# Patient Record
Sex: Male | Born: 1955 | Race: White | Hispanic: No | Marital: Single | Smoking: Current every day smoker
Health system: Southern US, Community
[De-identification: ages and names within clinical notes are randomized; demographics above are authoritative.]

## PROBLEM LIST (undated history)

## (undated) DIAGNOSIS — M1990 Unspecified osteoarthritis, unspecified site: Secondary | ICD-10-CM

## (undated) DIAGNOSIS — Z789 Other specified health status: Secondary | ICD-10-CM

## (undated) DIAGNOSIS — Z7289 Other problems related to lifestyle: Secondary | ICD-10-CM

## (undated) DIAGNOSIS — F1090 Alcohol use, unspecified, uncomplicated: Secondary | ICD-10-CM

## (undated) DIAGNOSIS — Z720 Tobacco use: Secondary | ICD-10-CM

## (undated) DIAGNOSIS — J449 Chronic obstructive pulmonary disease, unspecified: Secondary | ICD-10-CM

## (undated) DIAGNOSIS — I509 Heart failure, unspecified: Secondary | ICD-10-CM

## (undated) DIAGNOSIS — M545 Low back pain, unspecified: Secondary | ICD-10-CM

## (undated) DIAGNOSIS — I10 Essential (primary) hypertension: Secondary | ICD-10-CM

## (undated) HISTORY — PX: OTHER SURGICAL HISTORY: SHX169

---

## 2003-09-30 ENCOUNTER — Encounter: Payer: Self-pay | Admitting: Family Medicine

## 2003-09-30 ENCOUNTER — Emergency Department (HOSPITAL_COMMUNITY): Admission: AD | Admit: 2003-09-30 | Discharge: 2003-09-30 | Payer: Self-pay | Admitting: Family Medicine

## 2003-10-07 ENCOUNTER — Emergency Department (HOSPITAL_COMMUNITY): Admission: EM | Admit: 2003-10-07 | Discharge: 2003-10-07 | Payer: Self-pay | Admitting: Emergency Medicine

## 2003-10-08 ENCOUNTER — Emergency Department (HOSPITAL_COMMUNITY): Admission: AD | Admit: 2003-10-08 | Discharge: 2003-10-08 | Payer: Self-pay | Admitting: Family Medicine

## 2003-10-14 ENCOUNTER — Emergency Department (HOSPITAL_COMMUNITY): Admission: AD | Admit: 2003-10-14 | Discharge: 2003-10-14 | Payer: Self-pay | Admitting: Family Medicine

## 2004-04-30 ENCOUNTER — Other Ambulatory Visit: Payer: Self-pay

## 2004-09-24 ENCOUNTER — Emergency Department: Payer: Self-pay | Admitting: Unknown Physician Specialty

## 2004-09-24 ENCOUNTER — Other Ambulatory Visit: Payer: Self-pay

## 2004-10-03 ENCOUNTER — Emergency Department: Payer: Self-pay | Admitting: Emergency Medicine

## 2005-02-14 ENCOUNTER — Emergency Department: Payer: Self-pay | Admitting: Emergency Medicine

## 2005-04-09 ENCOUNTER — Emergency Department: Payer: Self-pay | Admitting: Internal Medicine

## 2008-12-10 ENCOUNTER — Emergency Department: Payer: Self-pay | Admitting: Emergency Medicine

## 2011-06-01 ENCOUNTER — Emergency Department: Payer: Self-pay | Admitting: Emergency Medicine

## 2011-10-16 ENCOUNTER — Emergency Department: Payer: Self-pay | Admitting: Emergency Medicine

## 2011-12-24 ENCOUNTER — Emergency Department: Payer: Self-pay | Admitting: *Deleted

## 2012-09-30 ENCOUNTER — Emergency Department: Payer: Self-pay | Admitting: Emergency Medicine

## 2012-09-30 LAB — COMPREHENSIVE METABOLIC PANEL
Albumin: 4 g/dL (ref 3.4–5.0)
Alkaline Phosphatase: 77 U/L (ref 50–136)
Anion Gap: 11 (ref 7–16)
BUN: 10 mg/dL (ref 7–18)
Bilirubin,Total: 0.3 mg/dL (ref 0.2–1.0)
Calcium, Total: 9.3 mg/dL (ref 8.5–10.1)
Chloride: 103 mmol/L (ref 98–107)
Co2: 25 mmol/L (ref 21–32)
Creatinine: 0.9 mg/dL (ref 0.60–1.30)
EGFR (African American): >60
EGFR (Non-African Amer.): >60
Glucose: 94 mg/dL (ref 65–99)
Osmolality: 276 (ref 275–301)
Potassium: 3.5 mmol/L (ref 3.5–5.1)
SGOT(AST): 29 U/L (ref 15–37)
SGPT (ALT): 28 U/L (ref 12–78)
Sodium: 139 mmol/L (ref 136–145)
Total Protein: 7.6 g/dL (ref 6.4–8.2)

## 2012-09-30 LAB — CBC
HCT: 49.9 % (ref 40.0–52.0)
HGB: 17.2 g/dL (ref 13.0–18.0)
MCH: 33.3 pg (ref 26.0–34.0)
MCHC: 34.5 g/dL (ref 32.0–36.0)
MCV: 96 fL (ref 80–100)
Platelet: 233 10*3/uL (ref 150–440)
RBC: 5.18 10*6/uL (ref 4.40–5.90)
RDW: 13.7 % (ref 11.5–14.5)
WBC: 9.9 10*3/uL (ref 3.8–10.6)

## 2012-11-09 ENCOUNTER — Emergency Department: Payer: Self-pay | Admitting: Unknown Physician Specialty

## 2012-11-09 LAB — COMPREHENSIVE METABOLIC PANEL
Albumin: 3.3 g/dL — ABNORMAL LOW (ref 3.4–5.0)
Alkaline Phosphatase: 80 U/L (ref 50–136)
Anion Gap: 5 — ABNORMAL LOW (ref 7–16)
BUN: 6 mg/dL — ABNORMAL LOW (ref 7–18)
Bilirubin,Total: 0.3 mg/dL (ref 0.2–1.0)
Calcium, Total: 8.9 mg/dL (ref 8.5–10.1)
Chloride: 99 mmol/L (ref 98–107)
Co2: 30 mmol/L (ref 21–32)
Creatinine: 0.72 mg/dL (ref 0.60–1.30)
EGFR (African American): >60
EGFR (Non-African Amer.): >60
Glucose: 86 mg/dL (ref 65–99)
Osmolality: 265 (ref 275–301)
Potassium: 4 mmol/L (ref 3.5–5.1)
SGOT(AST): 31 U/L (ref 15–37)
SGPT (ALT): 27 U/L (ref 12–78)
Sodium: 134 mmol/L — ABNORMAL LOW (ref 136–145)
Total Protein: 7.2 g/dL (ref 6.4–8.2)

## 2012-11-09 LAB — URINALYSIS, COMPLETE
Bacteria: NONE SEEN
Bilirubin,UR: NEGATIVE
Blood: NEGATIVE
Glucose,UR: NEGATIVE mg/dL (ref 0–75)
Hyaline Cast: 6
Ketone: NEGATIVE
Leukocyte Esterase: NEGATIVE
Nitrite: NEGATIVE
Ph: 6 (ref 4.5–8.0)
Protein: NEGATIVE
RBC,UR: <1 /HPF (ref 0–5)
Specific Gravity: 1.005 (ref 1.003–1.030)
Squamous Epithelial: NONE SEEN
WBC UR: <1 /HPF (ref 0–5)

## 2012-11-09 LAB — CK TOTAL AND CKMB (NOT AT ARMC)
CK, Total: 22 U/L — ABNORMAL LOW (ref 35–232)
CK-MB: <0.5 ng/mL — ABNORMAL LOW (ref 0.5–3.6)

## 2012-11-09 LAB — CBC
HCT: 45.3 % (ref 40.0–52.0)
HGB: 15.3 g/dL (ref 13.0–18.0)
MCH: 31.5 pg (ref 26.0–34.0)
MCHC: 33.7 g/dL (ref 32.0–36.0)
MCV: 93 fL (ref 80–100)
Platelet: 355 10*3/uL (ref 150–440)
RBC: 4.85 10*6/uL (ref 4.40–5.90)
RDW: 13.7 % (ref 11.5–14.5)
WBC: 13.5 10*3/uL — ABNORMAL HIGH (ref 3.8–10.6)

## 2012-11-09 LAB — TROPONIN I: Troponin-I: <0.02 ng/mL

## 2012-11-15 LAB — CULTURE, BLOOD (SINGLE)

## 2013-12-09 ENCOUNTER — Observation Stay: Payer: Self-pay | Admitting: Student

## 2013-12-09 LAB — CBC WITH DIFFERENTIAL/PLATELET
Basophil #: 0.1 10*3/uL (ref 0.0–0.1)
Basophil %: 1 %
Eosinophil #: 0.3 10*3/uL (ref 0.0–0.7)
Eosinophil %: 2.7 %
HCT: 49.4 % (ref 40.0–52.0)
HGB: 17.1 g/dL (ref 13.0–18.0)
Lymphocyte #: 3.5 10*3/uL (ref 1.0–3.6)
Lymphocyte %: 32.7 %
MCH: 33 pg (ref 26.0–34.0)
MCHC: 34.6 g/dL (ref 32.0–36.0)
MCV: 95 fL (ref 80–100)
Monocyte #: 0.8 x10 3/mm (ref 0.2–1.0)
Monocyte %: 7.2 %
Neutrophil #: 6 10*3/uL (ref 1.4–6.5)
Neutrophil %: 56.4 %
Platelet: 316 10*3/uL (ref 150–440)
RBC: 5.18 10*6/uL (ref 4.40–5.90)
RDW: 13.1 % (ref 11.5–14.5)
WBC: 10.6 10*3/uL (ref 3.8–10.6)

## 2013-12-09 LAB — BASIC METABOLIC PANEL
Anion Gap: 7 (ref 7–16)
BUN: 6 mg/dL — ABNORMAL LOW (ref 7–18)
Calcium, Total: 9.2 mg/dL (ref 8.5–10.1)
Chloride: 101 mmol/L (ref 98–107)
Co2: 31 mmol/L (ref 21–32)
Creatinine: 0.77 mg/dL (ref 0.60–1.30)
EGFR (African American): >60
EGFR (Non-African Amer.): >60
Glucose: 93 mg/dL (ref 65–99)
Osmolality: 275 (ref 275–301)
Potassium: 3.7 mmol/L (ref 3.5–5.1)
Sodium: 139 mmol/L (ref 136–145)

## 2013-12-10 LAB — BASIC METABOLIC PANEL
Anion Gap: 2 — ABNORMAL LOW (ref 7–16)
BUN: 8 mg/dL (ref 7–18)
Calcium, Total: 9.4 mg/dL (ref 8.5–10.1)
Chloride: 98 mmol/L (ref 98–107)
Co2: 34 mmol/L — ABNORMAL HIGH (ref 21–32)
Creatinine: 0.83 mg/dL (ref 0.60–1.30)
EGFR (African American): >60
EGFR (Non-African Amer.): >60
Glucose: 165 mg/dL — ABNORMAL HIGH (ref 65–99)
Osmolality: 270 (ref 275–301)
Potassium: 4.1 mmol/L (ref 3.5–5.1)
Sodium: 134 mmol/L — ABNORMAL LOW (ref 136–145)

## 2014-02-04 ENCOUNTER — Emergency Department: Payer: Self-pay | Admitting: Emergency Medicine

## 2015-04-08 NOTE — H&P (Signed)
PATIENT NAME:  Kurt Blankenship, Kurt Blankenship MR#:  [id number] DATE OF BIRTH:  [birth year]  DATE OF ADMISSION:  12/09/2013  PRIMARY CARE PHYSICIAN: Dr. Lacey Jensen.  CHIEF COMPLAINT: Pain in the ribs.   This is a pleasant 59 year old male with smoking dependence, hypertension, who presents with the above complaint. The patient said that he was walking and he tripped over his chair. His legs just kind of gave out and he landed on his left side, and he had pain. He took his oxycodone 10 mg at home, but it did not help. He came here to the ER for further evaluation. In the ER, a CT of the chest was performed, which was negative for rib fractures. However, he continued to have ongoing pain and was unable to move without any pain, so hospitalist was consulted for admission.   REVIEW OF SYSTEMS:  CONSTITUTIONAL: No fever, fatigue, weakness.  Positive pain. No weight loss or weight gain. EYES:  No blurred or double vision.   ENT: No ear pain, hearing loss, seasonal allergies, postnasal drip or sinus pain.  RESPIRATORY:  No cough. Positive wheezing. Positive history of COPD. No hemoptysis, dyspnea. Positive painful respirations.  CARDIOVASCULAR: No chest pain. No orthopnea, edema, arrhythmia, dyspnea on exertion, palpitations or syncope.   GASTROINTESTINAL: No nausea, vomiting, diarrhea, abdominal pain, melena or ulcers.  GENITOURINARY: No dysuria or hematuria.    ENDOCRINE: No polyuria or polydipsia.  HEMATOLOGIC AND LYMPHATICS:  No anemia or easy bruising.  SKIN: No rash or lesions.  MUSCULOSKELETAL: Positive pain in the ribs.  NEUROLOGIC:  No history of CVA, TIA or seizure.  PSYCHIATRIC: No history of anxiety or depression.   PAST MEDICAL HISTORY:   1.  COPD, not on oxygen.  2.  Hypertension.   PAST SURGICAL HISTORY: 1.  Left knee surgery.  2.  Appendectomy.   ALLERGIES: No known drug allergies.   MEDICATIONS:  1.  Ventolin HFA 2 puffs 4 times a day.  2.  Oxycodone 10 mg q.4 hours.  3.  Lisinopril 10 mg  daily.   FAMILY HISTORY:  His mom and dad are deceased.  SOCIAL HISTORY: The patient smokes half pack a day. Not interested in quitting smoking. Occasional alcohol.   PHYSICAL EXAMINATION:  VITAL SIGNS:  Temperature 97.7, pulse 75, respirations 18, blood pressure 169/102, 97% on room air.  GENERAL: The patient is alert, oriented, in mild distress due to his pain.  HEENT: Head is atraumatic. Pupils are round. Sclerae anicteric. Mucous membranes are moist. Oropharynx is clear.  NECK: Supple without JVD, carotid bruit or enlarged thyroid.  CARDIOVASCULAR: Regular rate and rhythm. No murmurs, gallops or rubs. PMI is not displaced.  LUNGS:  He has got some mild expiratory wheezing with some mild rhonchi, especially on the right lung. No rales or crackles are heard. Good air movement. No dullness to percussion or egophony.  ABDOMEN: Bowel sounds are present. Nontender, nondistended. No hepatosplenomegaly.  BACK: No CVA or vertebral tenderness. He is tender at the lower left ribs.  EXTREMITIES:  No clubbing, cyanosis or edema.  NEUROLOGIC:  Cranial nerves II through XII are intact. There are no focal deficits.  SKIN: Without rash or lesions.  MUSCULOSKELETAL:  The patient is able to move all extremities. No pathology to digits or nails.   LABORATORY DATA: Sodium 139, potassium 3.7, chloride 101, bicarb 31, BUN 6, creatinine 0.77. Glucose is 93. White blood cells 10.6, hemoglobin 17, hematocrit 49.4. Platelets are 316. PH 7.31, pCO2 of 69. CT chest shows no  acute rib fractures.   Chest x-ray shows no acute cardiopulmonary disease.   EKG: Normal sinus rhythm. No ST elevation or depression.   ASSESSMENT AND PLAN:  A 59 year old male who had a mechanical fall, who suffered contusion to his left chest wall without any rib fractures on CT scan.  1.  Chest contusion. The patient has ongoing intractable pain. The patient will need observation for his pain, and he is unable to move. I will continue his  oxycodone, write for a lidocaine patch, as well as p.r.n. medications. He may need to be, for a short time, on long-acting oxycodone with breakthrough Percocet.  I have asked for incentive spirometer.  2.  Chronic obstructive pulmonary disease. The patient has some mild wheezing on exam. He says he always has wheezing. I do not think this is an acute COPD exacerbation. We will continue his inhalers and monitor.  3.  Tobacco dependence. The patient does not want to quit smoking. I have placed a nicotine patch. The patient was counseled for 3 minutes.  4.  Accelerated hypertension in part due to his pain. We will continue lisinopril, and I have written for hydralazine p.r.n.   The patient is a full code status.      TIME SPENT: Approximately 40 minutes.    ____________________________ Janyth Contes. Juliene Pina, MD spm:dmm D: 12/09/2013 22:03:50 ET T: 12/09/2013 22:42:57 ET JOB#: [id number]  cc: Dorene Bruni P. Juliene Pina, MD, <Dictator> Lacey Jensen, FNP-C Mirel Hundal P Teal Raben MD ELECTRONICALLY SIGNED 12/10/2013 2:11

## 2015-04-09 NOTE — Discharge Summary (Signed)
PATIENT NAME:  Kurt Blankenship, Kurt Blankenship MR#:  [id number] DATE OF BIRTH:  1956/09/09  DATE OF ADMISSION:  12/09/2013 DATE OF DISCHARGE:  12/10/2013  PRIMARY CARE PHYSICIAN: Lacey Jensen, MD  CHIEF COMPLAINT: Status post fall and rib pain.   DISCHARGE DIAGNOSES:  1.  Rib pain, status post fall, without contusion or fracture.  2.  Chronic obstructive pulmonary disease.  3.  Hypertension.   DISCHARGE MEDICATIONS: Ventolin HFA 2 puffs four times a day as needed, oxycodone 10 mg every 4 hours, lisinopril 10 mg daily, Percocet 5/325, one tab every 4 hours as needed for pain for 4 days.   DIET: Low sodium.   ACTIVITY: As tolerated.   FOLLOWUP: Please follow with PCP within 1 to 2 weeks.   DISPOSITION: Home.   SIGNIFICANT LABORATORIES AND IMAGING: X-ray of the chest, 1 view: Right basilar scarring.   CT chest without contrast showed no acute displaced rib fracture or focal chest wall abnormality. No acute pulmonary contusion, hemorrhage, or pneumothorax. There is COPD and emphysema, probable right upper lobe parenchymal scarring, medial right middle lobe chronic atelectasis versus scarring.   CBC within normal limits. BUN 6, creatinine 0.77 on admission, sodium 139, potassium 3.7.   HISTORY OF PRESENT ILLNESS AND HOSPITAL COURSE: For full details of H and P, please see the dictation on December 24 by Dr. Juliene Pina, but briefly this is a 59 year old with  COPD, hypertension, and chronic pain, who tripped, sustaining a fall and developed left-sided pain where he fell on the chair. He came into the hospital after taking his oxycodone without any significant improvement. Here, a CT of the chest was performed which was negative for fracture;  however, he had ongoing pains and therefore he was admitted to the hospitalist service for observation. He was started on some Percocet p.r.n., morphine IV p.r.n. and incentive spirometer and oxygen. He did well. Currently, he has controlled pain. Use of incentive spirometer was  strongly encouraged to him and, at this point, as the pain is better, he is moving a good amount of air, ambulating, tolerating diet.   PHYSICAL EXAMINATION:  VITAL SIGNS: Today's temperature is 97.4. Pulse rate 74, respiratory rate 17, blood pressure 167/80, oxygen saturation 94% on oxygen.  GENERAL: Appears older than age.  HEENT: Normocephalic, atraumatic. Poor dentition. Moist mucous membranes.  NECK: Supple.  CARDIOVASCULAR: S1, S2, irregularly irregular.  LUNGS: Clear to auscultation without wheezing, rhonchi or rales. Moving good air entry.  ABDOMEN: Soft, nontender, nondistended. Some mild tenderness to palpation on the left medial rib cage but no ecchymosis. More generalized tenderness without any focal tenderness.  EXTREMITIES: No pitting edema.  ABDOMEN: Soft, nontender.   At this point, he will be discharged with outpatient follow-up.   TOTAL TIME SPENT: 30 minutes.   ____________________________ Krystal Eaton, MD sa:np D: 12/10/2013 13:04:50 ET T: 12/10/2013 15:41:46 ET JOB#: [id number]  cc: Krystal Eaton, MD, <Dictator> Lacey Jensen, MD Krystal Eaton MD ELECTRONICALLY SIGNED 12/29/2013 11:18

## 2016-05-15 ENCOUNTER — Encounter: Payer: Self-pay | Admitting: Emergency Medicine

## 2016-05-15 ENCOUNTER — Inpatient Hospital Stay
Admission: EM | Admit: 2016-05-15 | Discharge: 2016-05-16 | DRG: 190 | Disposition: A | Discharge status: Home / Self Care | Payer: Medicare Other | Attending: Internal Medicine | Admitting: Internal Medicine

## 2016-05-15 ENCOUNTER — Emergency Department: Payer: Medicare Other

## 2016-05-15 DIAGNOSIS — Z79891 Long term (current) use of opiate analgesic: Secondary | ICD-10-CM | POA: Diagnosis not present

## 2016-05-15 DIAGNOSIS — M549 Dorsalgia, unspecified: Secondary | ICD-10-CM | POA: Diagnosis present

## 2016-05-15 DIAGNOSIS — Z7952 Long term (current) use of systemic steroids: Secondary | ICD-10-CM | POA: Diagnosis not present

## 2016-05-15 DIAGNOSIS — I10 Essential (primary) hypertension: Secondary | ICD-10-CM | POA: Diagnosis present

## 2016-05-15 DIAGNOSIS — J449 Chronic obstructive pulmonary disease, unspecified: Secondary | ICD-10-CM | POA: Diagnosis present

## 2016-05-15 DIAGNOSIS — J9601 Acute respiratory failure with hypoxia: Secondary | ICD-10-CM | POA: Diagnosis present

## 2016-05-15 DIAGNOSIS — F17210 Nicotine dependence, cigarettes, uncomplicated: Secondary | ICD-10-CM | POA: Diagnosis present

## 2016-05-15 DIAGNOSIS — G8929 Other chronic pain: Secondary | ICD-10-CM | POA: Diagnosis present

## 2016-05-15 DIAGNOSIS — Z79899 Other long term (current) drug therapy: Secondary | ICD-10-CM | POA: Diagnosis not present

## 2016-05-15 DIAGNOSIS — J441 Chronic obstructive pulmonary disease with (acute) exacerbation: Principal | ICD-10-CM | POA: Diagnosis present

## 2016-05-15 HISTORY — DX: Chronic obstructive pulmonary disease, unspecified: J44.9

## 2016-05-15 HISTORY — DX: Essential (primary) hypertension: I10

## 2016-05-15 LAB — COMPREHENSIVE METABOLIC PANEL
ALT: 21 U/L (ref 17–63)
AST: 29 U/L (ref 15–41)
Albumin: 4.2 g/dL (ref 3.5–5.0)
Alkaline Phosphatase: 63 U/L (ref 38–126)
Anion gap: 7 (ref 5–15)
BUN: 9 mg/dL (ref 6–20)
CO2: 31 mmol/L (ref 22–32)
Calcium: 9.2 mg/dL (ref 8.9–10.3)
Chloride: 97 mmol/L — ABNORMAL LOW (ref 101–111)
Creatinine, Ser: 0.72 mg/dL (ref 0.61–1.24)
GFR calc Af Amer: >60 mL/min (ref >60)
GFR calc non Af Amer: >60 mL/min (ref >60)
Glucose, Bld: 93 mg/dL (ref 65–99)
Potassium: 3.7 mmol/L (ref 3.5–5.1)
Sodium: 135 mmol/L (ref 135–145)
Total Bilirubin: 0.5 mg/dL (ref 0.3–1.2)
Total Protein: 7.1 g/dL (ref 6.5–8.1)

## 2016-05-15 LAB — CBC WITH DIFFERENTIAL/PLATELET
Basophils Absolute: 0.1 10*3/uL (ref 0–0.1)
Basophils Relative: 1 %
Eosinophils Absolute: 0.6 10*3/uL (ref 0–0.7)
Eosinophils Relative: 9 %
HCT: 48.3 % (ref 40.0–52.0)
Hemoglobin: 16.9 g/dL (ref 13.0–18.0)
Lymphocytes Relative: 27 %
Lymphs Abs: 1.8 10*3/uL (ref 1.0–3.6)
MCH: 33.8 pg (ref 26.0–34.0)
MCHC: 35.1 g/dL (ref 32.0–36.0)
MCV: 96.3 fL (ref 80.0–100.0)
Monocytes Absolute: 0.7 10*3/uL (ref 0.2–1.0)
Monocytes Relative: 11 %
Neutro Abs: 3.6 10*3/uL (ref 1.4–6.5)
Neutrophils Relative %: 52 %
Platelets: 267 10*3/uL (ref 150–440)
RBC: 5.01 MIL/uL (ref 4.40–5.90)
RDW: 15 % — ABNORMAL HIGH (ref 11.5–14.5)
WBC: 6.9 10*3/uL (ref 3.8–10.6)

## 2016-05-15 LAB — TROPONIN I: Troponin I: <0.03 ng/mL (ref <0.031)

## 2016-05-15 MED ORDER — ALBUTEROL SULFATE HFA 108 (90 BASE) MCG/ACT IN AERS: 2.0000 | INHALATION_SPRAY | RESPIRATORY_TRACT | Status: DC | PRN

## 2016-05-15 MED ORDER — ALBUTEROL SULFATE HFA 108 (90 BASE) MCG/ACT IN AERS: 2.0000 | INHALATION_SPRAY | Freq: Four times a day (QID) | RESPIRATORY_TRACT | Status: DC | PRN

## 2016-05-15 MED ORDER — ALBUTEROL SULFATE (2.5 MG/3ML) 0.083% IN NEBU
2.5000 mg | INHALATION_SOLUTION | Freq: Once | RESPIRATORY_TRACT | Status: AC
  Administered 2016-05-15: 2.5 mg via RESPIRATORY_TRACT
  Filled 2016-05-15: qty 3

## 2016-05-15 MED ORDER — METHYLPREDNISOLONE SODIUM SUCC 125 MG IJ SOLR
125.0000 mg | Freq: Once | INTRAMUSCULAR | Status: AC
  Administered 2016-05-15: 125 mg via INTRAVENOUS
  Filled 2016-05-15: qty 2

## 2016-05-15 MED ORDER — IPRATROPIUM-ALBUTEROL 0.5-2.5 (3) MG/3ML IN SOLN
3.0000 mL | Freq: Once | RESPIRATORY_TRACT | Status: AC
  Administered 2016-05-15: 3 mL via RESPIRATORY_TRACT
  Filled 2016-05-15: qty 3

## 2016-05-15 MED ORDER — ENOXAPARIN SODIUM 40 MG/0.4ML ~~LOC~~ SOLN
40.0000 mg | SUBCUTANEOUS | Status: DC
  Administered 2016-05-15: 40 mg via SUBCUTANEOUS
  Filled 2016-05-15: qty 0.4

## 2016-05-15 MED ORDER — AZITHROMYCIN 250 MG PO TABS: ORAL_TABLET | ORAL | Status: DC

## 2016-05-15 MED ORDER — ALBUTEROL SULFATE (2.5 MG/3ML) 0.083% IN NEBU
2.5000 mg | INHALATION_SOLUTION | Freq: Four times a day (QID) | RESPIRATORY_TRACT | Status: DC
Start: 2016-05-15 — End: 2016-05-16
  Administered 2016-05-15 – 2016-05-16 (×3): 2.5 mg via RESPIRATORY_TRACT
  Filled 2016-05-15 (×4): qty 3

## 2016-05-15 MED ORDER — ALBUTEROL SULFATE (2.5 MG/3ML) 0.083% IN NEBU: 5.0000 mg | INHALATION_SOLUTION | Freq: Once | RESPIRATORY_TRACT | Status: DC

## 2016-05-15 MED ORDER — MOMETASONE FURO-FORMOTEROL FUM 200-5 MCG/ACT IN AERO
2.0000 | INHALATION_SPRAY | Freq: Two times a day (BID) | RESPIRATORY_TRACT | Status: DC
  Administered 2016-05-15 – 2016-05-16 (×3): 2 via RESPIRATORY_TRACT
  Filled 2016-05-15: qty 8.8

## 2016-05-15 MED ORDER — MAGNESIUM SULFATE 2 GM/50ML IV SOLN
2.0000 g | Freq: Once | INTRAVENOUS | Status: AC
  Administered 2016-05-15: 2 g via INTRAVENOUS
  Filled 2016-05-15: qty 50

## 2016-05-15 MED ORDER — CHLORTHALIDONE 25 MG PO TABS
25.0000 mg | ORAL_TABLET | Freq: Every day | ORAL | Status: DC
  Administered 2016-05-15 – 2016-05-16 (×2): 25 mg via ORAL
  Filled 2016-05-15 (×2): qty 1

## 2016-05-15 MED ORDER — PREDNISONE 20 MG PO TABS: 60.0000 mg | ORAL_TABLET | Freq: Every day | ORAL | Status: DC

## 2016-05-15 MED ORDER — METHYLPREDNISOLONE SODIUM SUCC 125 MG IJ SOLR
60.0000 mg | Freq: Two times a day (BID) | INTRAMUSCULAR | Status: DC
  Administered 2016-05-15 – 2016-05-16 (×2): 60 mg via INTRAVENOUS
  Filled 2016-05-15 (×2): qty 2

## 2016-05-15 MED ORDER — DOXYCYCLINE HYCLATE 100 MG PO TABS
100.0000 mg | ORAL_TABLET | Freq: Two times a day (BID) | ORAL | Status: DC
  Administered 2016-05-15 – 2016-05-16 (×3): 100 mg via ORAL
  Filled 2016-05-15 (×3): qty 1

## 2016-05-15 MED ORDER — ALBUTEROL SULFATE (2.5 MG/3ML) 0.083% IN NEBU: 2.5000 mg | INHALATION_SOLUTION | RESPIRATORY_TRACT | Status: DC | PRN

## 2016-05-15 MED ORDER — ONDANSETRON HCL 4 MG/2ML IJ SOLN: 4.0000 mg | Freq: Four times a day (QID) | INTRAMUSCULAR | Status: DC | PRN

## 2016-05-15 MED ORDER — OXYCODONE HCL 5 MG PO TABS
10.0000 mg | ORAL_TABLET | Freq: Four times a day (QID) | ORAL | Status: DC
  Administered 2016-05-15 – 2016-05-16 (×5): 10 mg via ORAL
  Filled 2016-05-15 (×3): qty 2
  Filled 2016-05-15: qty 1
  Filled 2016-05-15 (×2): qty 2

## 2016-05-15 MED ORDER — ACETAMINOPHEN 650 MG RE SUPP: 650.0000 mg | Freq: Four times a day (QID) | RECTAL | Status: DC | PRN

## 2016-05-15 MED ORDER — ACETAMINOPHEN 325 MG PO TABS: 650.0000 mg | ORAL_TABLET | Freq: Four times a day (QID) | ORAL | Status: DC | PRN

## 2016-05-15 MED ORDER — ONDANSETRON HCL 4 MG PO TABS: 4.0000 mg | ORAL_TABLET | Freq: Four times a day (QID) | ORAL | Status: DC | PRN

## 2016-05-15 NOTE — ED Notes (Signed)
Patient transported to X-ray 

## 2016-05-15 NOTE — ED Provider Notes (Signed)
Despite bronchodilators, steroids, IV magnesium infusion, patient has persistent room air hypoxia to 88%. With ambulation he gets very short of breath and oxygen saturation decreases to 84%. Case discussed with hospitalist for admission.  Sharman Cheek, MD 05/15/16 1000

## 2016-05-15 NOTE — Progress Notes (Signed)
   05/15/16 1230  Clinical Encounter Type  Visited With Patient  Visit Type Initial  Referral From Nurse  Consult/Referral To Chaplain  Spiritual Encounters  Spiritual Needs Literature;Prayer  Stress Factors  Patient Stress Factors Health changes  Advance Directives (For Healthcare)  Does patient have an advance directive? No  Would patient like information on creating an advanced directive? Yes - Transport planner given  Visited patient and provided AD education. Patient was not interested in pursuing an AD, but requested prayer for healing. Prayed with and provided pastoral care for [facility]. Chap. Teriyah Purington G. Annamary Buschman, ext. [phone]

## 2016-05-15 NOTE — ED Provider Notes (Signed)
[facility] Emergency Department Provider Note   ____________________________________________  Time seen: Approximately 5:18 AM  I have reviewed the triage vital signs and the nursing notes.   HISTORY  Chief Complaint Shortness of Breath    HPI Kurt Blankenship is a 60 y.o. male who comes into the hospital today with coughing and shortness of breath. The patient reports that he was coughing so bad that he woke up and he couldn't catch his breath. The patient called EMS because he wasn't sure if he had pneumonia. He reports that he had a fever last night but he did not take his temperature. He's had a dry cough that is been nonproductive with no chest pain. He does not wear oxygen at home. The patient reports that he ran out of his inhaler earlier this afternoon. He reports that currently his breathing is improved. The patient reports that he has a headache which she is been self treating but he also has a some abdominal soreness from the coughing. The patient denies any radiation of this discomfort and any dizziness at this time.   Past Medical History  Diagnosis Date  . COPD (chronic obstructive pulmonary disease) (HCC)   . Hypertension     There are no active problems to display for this patient.   History reviewed. No pertinent past surgical history.  Current Outpatient Rx  Name  Route  Sig  Dispense  Refill  . albuterol (PROVENTIL HFA;VENTOLIN HFA) 108 (90 Base) MCG/ACT inhaler   Inhalation   Inhale 2 puffs into the lungs every 6 (six) hours as needed.   1 Inhaler   0   . azithromycin (ZITHROMAX Z-PAK) 250 MG tablet      Take 2 tablets (500 mg) on  Day 1,  followed by 1 tablet (250 mg) once daily on Days 2 through 5.   6 each   0   . predniSONE (DELTASONE) 20 MG tablet   Oral   Take 3 tablets (60 mg total) by mouth daily.   12 tablet   0     Allergies Review of patient's allergies indicates no known allergies.  History reviewed. No  pertinent family history.  Social History Social History  Substance Use Topics  . Smoking status: Current Every Day Smoker -- 0.75 packs/day    Types: Cigarettes  . Smokeless tobacco: Never Used  . Alcohol Use: Yes     Comment: beers    Review of Systems Constitutional:  fever/chills Eyes: No visual changes. ENT: No sore throat. Cardiovascular: Denies chest pain. Respiratory:  shortness of breath. Gastrointestinal: No abdominal pain.  No nausea, no vomiting.  No diarrhea.  No constipation. Genitourinary: Negative for dysuria. Musculoskeletal: Negative for back pain. Skin: Negative for rash. Neurological: Negative for headaches, focal weakness or numbness.  10-point ROS otherwise negative.  ____________________________________________   PHYSICAL EXAM:  VITAL SIGNS: ED Triage Vitals  Enc Vitals Group     BP 05/15/16 0459 143/95 mmHg     Pulse Rate 05/15/16 0459 93     Resp 05/15/16 0459 16     Temp 05/15/16 0459 98.1 F (36.7 C)     Temp Source 05/15/16 0459 Oral     SpO2 05/15/16 0459 94 %     Weight 05/15/16 0454 168 lb (76.204 kg)     Height 05/15/16 0454  (1.753 m)     Head Cir --      Peak Flow --      Pain Score  05/15/16 0451 8     Pain Loc --      Pain Edu? --      Excl. in GC? --     Constitutional: Alert and oriented. Well appearing and in moderate respiratory distress. Eyes: Conjunctivae are normal. PERRL. EOMI. Head: Atraumatic. Nose: No congestion/rhinnorhea. Mouth/Throat: Mucous membranes are moist.  Oropharynx non-erythematous. Cardiovascular: Normal rate, regular rhythm. Grossly normal heart sounds.  Good peripheral circulation. Respiratory: Normal respiratory effort.  No retractions. Expiratory wheezes throughout all lung fields. Gastrointestinal: Soft and nontender. No distention. Positive bowel sounds Musculoskeletal: No lower extremity tenderness nor edema.  No joint effusions. Neurologic:  Normal speech and language.  Skin:  Skin is  warm, dry and intact. No rash noted. Psychiatric: Mood and affect are normal. Speech and behavior are normal.  ____________________________________________   LABS (all labs ordered are listed, but only abnormal results are displayed)  Labs Reviewed  CBC WITH DIFFERENTIAL/PLATELET - Abnormal; Notable for the following:    RDW 15.0 (*)    All other components within normal limits  COMPREHENSIVE METABOLIC PANEL - Abnormal; Notable for the following:    Chloride 97 (*)    All other components within normal limits  TROPONIN I   ____________________________________________  EKG  ED ECG REPORT I, Rebecka Apley, the attending physician, personally viewed and interpreted this ECG.   Date: 05/15/2016  EKG Time: 456  Rate: 94  Rhythm: normal sinus rhythm  Axis: normal  Intervals:none  ST&T Change: none  ____________________________________________  RADIOLOGY  CXR: Mild hyperinflation and emphysema, no evidence of superimposed acute process. ____________________________________________   PROCEDURES  Procedure(s) performed: None  Critical Care performed: No  ____________________________________________   INITIAL IMPRESSION / ASSESSMENT AND PLAN / ED COURSE  Pertinent labs & imaging results that were available during my care of the patient were reviewed by me and considered in my medical decision making (see chart for details).  This is a 60 year old male who comes into the hospital today with some shortness of breath. The patient was wheezing significantly when he arrived. The patient will receive some DuoNeb treatments as well as Solu-Medrol and magnesium sulfate.  The patient's wheezing is significantly improved. He is on O2. I will have the nurse wean his O2 to determine if the patient is able to tolerate room air and then he will be dispositioned. The patient's care was signed out to Dr. Scotty Court who will reassess the patient and determine his  disposition. ____________________________________________   FINAL CLINICAL IMPRESSION(S) / ED DIAGNOSES  Final diagnoses:  COPD exacerbation (HCC)      NEW MEDICATIONS STARTED DURING THIS VISIT:  New Prescriptions   ALBUTEROL (PROVENTIL HFA;VENTOLIN HFA) 108 (90 BASE) MCG/ACT INHALER    Inhale 2 puffs into the lungs every 6 (six) hours as needed.   AZITHROMYCIN (ZITHROMAX Z-PAK) 250 MG TABLET    Take 2 tablets (500 mg) on  Day 1,  followed by 1 tablet (250 mg) once daily on Days 2 through 5.   PREDNISONE (DELTASONE) 20 MG TABLET    Take 3 tablets (60 mg total) by mouth daily.     Note:  This document was prepared using Dragon voice recognition software and may include unintentional dictation errors.    Rebecka Apley, MD 05/15/16 [phone]

## 2016-05-15 NOTE — ED Notes (Addendum)
Pt arrived to ED by [facility] with c/o SOB, cough, wheeze, and chest/abdominal pain(from coughing) x2 weeks, worse tonight that he could not sleep. Per EMS initial SpO2-80s on room air. Pt states that he ran out inhaler x2 days. Pt alerts and oriented x4, airway intact.

## 2016-05-15 NOTE — H&P (Signed)
[facility] - [place] [facility]   PATIENT NAME: Kurt Blankenship    MR#:  [id number]  DATE OF BIRTH:  10-05-1956  DATE OF ADMISSION:  05/15/2016  PRIMARY CARE PHYSICIAN: Ro, Lindalou Hose, MD   REQUESTING/REFERRING PHYSICIAN: Dr Scotty Court  CHIEF COMPLAINT:  Shortness of breath with cough and wheezing  HISTORY OF PRESENT ILLNESS:  Kurt Blankenship  is a 60 y.o. male with a known history of Emphysema with ongoing tobacco abuse not on any home oxygen, hypertension comes to the emergency room with increasing shortness of breath cough and wheezing. He received several rounds of nebulizer and use of Solu-Medrol. Patient feels somewhat better. He was found to be hypoxic with sats 84% on room air. Patient is being admitted for acute on chronic COPD exacerbation.  PAST MEDICAL HISTORY:   Past Medical History  Diagnosis Date  . COPD (chronic obstructive pulmonary disease) (HCC)   . Hypertension     PAST SURGICAL HISTOIRY:  History reviewed. No pertinent past surgical history.  SOCIAL HISTORY:   Social History  Substance Use Topics  . Smoking status: Current Every Day Smoker -- 0.75 packs/day    Types: Cigarettes  . Smokeless tobacco: Never Used  . Alcohol Use: Yes     Comment: beers    FAMILY HISTORY:  History reviewed. No pertinent family history.  DRUG ALLERGIES:  No Known Allergies  REVIEW OF SYSTEMS:  Review of Systems  Constitutional: Negative for fever, chills and weight loss.  HENT: Negative for ear discharge, ear pain and nosebleeds.   Eyes: Negative for blurred vision, pain and discharge.  Respiratory: Positive for cough, sputum production, shortness of breath and wheezing. Negative for stridor.   Cardiovascular: Negative for chest pain, palpitations, orthopnea and PND.  Gastrointestinal: Negative for nausea, vomiting, abdominal pain and diarrhea.  Genitourinary: Negative for urgency and frequency.  Musculoskeletal: Negative for back pain and joint  pain.  Neurological: Positive for weakness. Negative for sensory change, speech change and focal weakness.  Psychiatric/Behavioral: Negative for depression and hallucinations. The patient is not nervous/anxious.   All other systems reviewed and are negative.    MEDICATIONS AT HOME:   Prior to Admission medications   Medication Sig Start Date End Date Taking? Authorizing Provider  albuterol (PROVENTIL HFA;VENTOLIN HFA) 108 (90 Base) MCG/ACT inhaler Inhale 2 puffs into the lungs every 4 (four) hours as needed for wheezing or shortness of breath.   Yes Historical Provider, MD  budesonide-formoterol (SYMBICORT) 160-4.5 MCG/ACT inhaler Inhale 2 puffs into the lungs 2 (two) times daily.   Yes Historical Provider, MD  chlorthalidone (HYGROTON) 25 MG tablet Take 25 mg by mouth daily.   Yes Historical Provider, MD  Oxycodone HCl 10 MG TABS Take 10 mg by mouth every 6 (six) hours.   Yes Historical Provider, MD  albuterol (PROVENTIL HFA;VENTOLIN HFA) 108 (90 Base) MCG/ACT inhaler Inhale 2 puffs into the lungs every 6 (six) hours as needed. 05/15/16   Rebecka Apley, MD  azithromycin (ZITHROMAX Z-PAK) 250 MG tablet Take 2 tablets (500 mg) on  Day 1,  followed by 1 tablet (250 mg) once daily on Days 2 through 5. 05/15/16 05/20/16  Rebecka Apley, MD  predniSONE (DELTASONE) 20 MG tablet Take 3 tablets (60 mg total) by mouth daily. 05/15/16   Rebecka Apley, MD      VITAL SIGNS:  Blood pressure 146/81, pulse 95, temperature 98.1 F (36.7 C), temperature source Oral, resp. rate 17, height  (1.753 m), weight  76.204 kg (168 lb), SpO2 84 %.  PHYSICAL EXAMINATION:  GENERAL:  60 y.o.-year-old patient lying in the bed with no acute distress. Looks older than his stated age EYES: Pupils equal, round, reactive to light and accommodation. No scleral icterus. Extraocular muscles intact.  HEENT: Head atraumatic, normocephalic. Oropharynx and nasopharynx clear.  NECK:  Supple, no jugular venous distention.  No thyroid enlargement, no tenderness.  LUNGS: Distant breath sounds bilaterally, no wheezing, rales,rhonchi or crepitation. No use of accessory muscles of respiration.  CARDIOVASCULAR: S1, S2 normal. No murmurs, rubs, or gallops.  ABDOMEN: Soft, nontender, nondistended. Bowel sounds present. No organomegaly or mass.  EXTREMITIES: No pedal edema, cyanosis, or clubbing.  NEUROLOGIC: Cranial nerves II through XII are intact. Muscle strength 5/5 in all extremities. Sensation intact. Gait not checked.  PSYCHIATRIC: patient is alert and oriented x 3.  SKIN: No obvious rash, lesion, or ulcer.   LABORATORY PANEL:   CBC  Recent Labs Lab 05/15/16 0455  WBC 6.9  HGB 16.9  HCT 48.3  PLT 267   ------------------------------------------------------------------------------------------------------------------  Chemistries   Recent Labs Lab 05/15/16 0455  NA 135  K 3.7  CL 97*  CO2 31  GLUCOSE 93  BUN 9  CREATININE 0.72  CALCIUM 9.2  AST 29  ALT 21  ALKPHOS 63  BILITOT 0.5   Cardiac Enzymes  Recent Labs Lab 05/15/16 0455  TROPONINI <0.03   RADIOLOGY:  Dg Chest 2 View  05/15/2016  CLINICAL DATA:  Cough, shortness of breath and wheezing. Chest and abdominal pain for 2 weeks. EXAM: CHEST  2 VIEW COMPARISON:  Radiographs and chest CT 12/09/2013 FINDINGS: The cardiomediastinal contours are normal. Mild hyperinflation and emphysema. Trace lingular atelectasis. Pulmonary vasculature is normal. No consolidation, pleural effusion, or pneumothorax. No acute osseous abnormalities are seen. IMPRESSION: Mild hyperinflation and emphysema. No evidence of superimposed acute process. Electronically Signed   By: Rubye Oaks M.D.   On: 05/15/2016 05:38   EKG:  Normal sinus rhythm, atrial premature beats  IMPRESSION AND PLAN:   Kurt Blankenship  is a 60 y.o. male with a known history of Emphysema with ongoing tobacco abuse not on any home oxygen, hypertension comes to the emergency room with  increasing shortness of breath cough and wheezing. He received several rounds of nebulizer and use of Solu-Medrol.  1. acute hypoxic respiratory failure secondary to acute on chronic COPD exacerbation -Admit to medical floor -IV Solu-Medrol -Empiric by mouth doxycycline -Nebulizer and oral inhalers  2. Hypertension continue chlorthalidone  3. Chronic back pain continue oxycodone home dose  4. Tobacco abuse counseled smoking cessation patient not motivated for 4 minutes spent. He reports his cut back from 2 packs to half a pack of cigarettes daily  5. DVT prophylaxis subcutaneous Lovenox   All the records are reviewed and case discussed with ED provider. Management plans discussed with the patient, family and they are in agreement.  CODE STATUS: Full  TOTAL TIME TAKING CARE OF THIS PATIENT: 45 tes.    Xandria Gallaga M.D on 05/15/2016 at 10:42 AM  Between 7am to 6pm - Pager - [phone]  After 6pm go to [email or web address] - password [email or web address] [facility]  [place] [facility]  Office  [phone]  CC: Primary care physician; Loa Socks Lindalou Hose, MD

## 2016-05-16 MED ORDER — ALBUTEROL SULFATE (2.5 MG/3ML) 0.083% IN NEBU: 2.5000 mg | INHALATION_SOLUTION | RESPIRATORY_TRACT | Status: DC | PRN

## 2016-05-16 MED ORDER — PREDNISONE 50 MG PO TABS: 50.0000 mg | ORAL_TABLET | Freq: Every day | ORAL | Status: DC

## 2016-05-16 MED ORDER — PREDNISONE 10 MG PO TABS: ORAL_TABLET | ORAL | Status: DC

## 2016-05-16 MED ORDER — DOXYCYCLINE HYCLATE 100 MG PO TABS: 100.0000 mg | ORAL_TABLET | Freq: Two times a day (BID) | ORAL | Status: DC

## 2016-05-16 NOTE — Progress Notes (Addendum)
SATURATION QUALIFICATIONS: (This note is used to comply with regulatory documentation for home oxygen)  Patient Saturations on Room Air at Rest = 91%  Patient Saturations on Room Air while Ambulating = 87%  Patient Saturations on 2 Liters of oxygen while Ambulating = 91%  Please briefly explain why patient needs home oxygen: 

## 2016-05-16 NOTE — Care Management Important Message (Signed)
Important Message  Patient Details  Name: Kurt Blankenship MRN: [id number] Date of Birth: 1956-11-24   Medicare Important Message Given:  Yes    Gwenette Greet, RN 05/16/2016, 11:49 AM

## 2016-05-16 NOTE — Discharge Instructions (Signed)
Use your oxygen as instructed Nebulizer per instruction Stop smoking!!

## 2016-05-16 NOTE — Discharge Summary (Signed)
[facility] [place] [facility]   PATIENT NAME: Kurt Blankenship    MR#:  [id number]  DATE OF BIRTH:  [birth year]  DATE OF ADMISSION:  05/15/2016 ADMITTING PHYSICIAN: Enedina Finner, MD  DATE OF DISCHARGE: 05/16/16  PRIMARY CARE PHYSICIAN: Ro, Lindalou Hose, MD    ADMISSION DIAGNOSIS:  COPD exacerbation (HCC) [J44.1]  DISCHARGE DIAGNOSIS:  Acute on chronic copd exacerbation Hypoxia now on oxygen Tobacco abuse  SECONDARY DIAGNOSIS:   Past Medical History  Diagnosis Date  . COPD (chronic obstructive pulmonary disease) (HCC)   . Hypertension     HOSPITAL COURSE:  Kurt Blankenship is a 60 y.o. male with a known history of Emphysema with ongoing tobacco abuse not on any home oxygen, hypertension comes to the emergency room with increasing shortness of breath cough and wheezing. He received several rounds of nebulizer and use of Solu-Medrol.  1. acute hypoxic respiratory failure secondary to acute on chronic COPD exacerbation -IV Solu-Medrol--po prednisone taper -Empiric by mouth doxycycline -Nebulizer and oral inhalers -pt will need oxygen at home. CM to set it up  2. Hypertension continue chlorthalidone  3. Chronic back pain continue oxycodone home dose  4. Tobacco abuse counseled smoking cessation patient not motivated for 4 minutes spent. He reports his cut back from 2 packs to half a pack of cigarettes daily  5. DVT prophylaxis subcutaneous Lovenox  Overall stable D/c home later today with oxygen and nebulizer CONSULTS OBTAINED:     DRUG ALLERGIES:  No Known Allergies  DISCHARGE MEDICATIONS:   Current Discharge Medication List    START taking these medications   Details  albuterol (PROVENTIL) (2.5 MG/3ML) 0.083% nebulizer solution Take 3 mLs (2.5 mg total) by nebulization every 4 (four) hours as needed for wheezing or shortness of breath. Qty: 75 mL, Refills: 12    doxycycline (VIBRA-TABS) 100 MG tablet Take 1 tablet (100 mg total) by mouth  every 12 (twelve) hours. Qty: 10 tablet, Refills: 0    predniSONE (DELTASONE) 10 MG tablet Take 50 mg daily and taper by 10 mg daily then stop Qty: 15 tablet, Refills: 0      CONTINUE these medications which have NOT CHANGED   Details  albuterol (PROVENTIL HFA;VENTOLIN HFA) 108 (90 Base) MCG/ACT inhaler Inhale 2 puffs into the lungs every 4 (four) hours as needed for wheezing or shortness of breath.    budesonide-formoterol (SYMBICORT) 160-4.5 MCG/ACT inhaler Inhale 2 puffs into the lungs 2 (two) times daily.    chlorthalidone (HYGROTON) 25 MG tablet Take 25 mg by mouth daily.    Oxycodone HCl 10 MG TABS Take 10 mg by mouth every 6 (six) hours.        If you experience worsening of your admission symptoms, develop shortness of breath, life threatening emergency, suicidal or homicidal thoughts you must seek medical attention immediately by calling 911 or calling your MD immediately  if symptoms less severe.  You Must read complete instructions/literature along with all the possible adverse reactions/side effects for all the Medicines you take and that have been prescribed to you. Take any new Medicines after you have completely understood and accept all the possible adverse reactions/side effects.   Please note  You were cared for by a hospitalist during your hospital stay. If you have any questions about your discharge medications or the care you received while you were in the hospital after you are discharged, you can call the unit and asked to speak with the hospitalist on call if the  hospitalist that took care of you is not available. Once you are discharged, your primary care physician will handle any further medical issues. Please note that NO REFILLS for any discharge medications will be authorized once you are discharged, as it is imperative that you return to your primary care physician (or establish a relationship with a primary care physician if you do not have one) for your  aftercare needs so that they can reassess your need for medications and monitor your lab values. Today   SUBJECTIVE   Doing well. Wants to go  home  VITAL SIGNS:  Blood pressure 140/72, pulse 60, temperature 97.7 F (36.5 C), temperature source Oral, resp. rate 16, height 5\' 9"  (1.753 m), weight 73.846 kg (162 lb 12.8 oz), SpO2 91 %.  I/O:    Intake/Output Summary (Last 24 hours) at 05/16/16 1218 Last data filed at 05/16/16 0900  Gross per 24 hour  Intake    480 ml  Output      0 ml  Net    480 ml    PHYSICAL EXAMINATION:  GENERAL:  60 y.o.-year-old patient lying in the bed with no acute distress.  EYES: Pupils equal, round, reactive to light and accommodation. No scleral icterus. Extraocular muscles intact.  HEENT: Head atraumatic, normocephalic. Oropharynx and nasopharynx clear.  NECK:  Supple, no jugular venous distention. No thyroid enlargement, no tenderness.  LUNGS: Normal breath sounds bilaterally, no wheezing, rales,rhonchi or crepitation. No use of accessory muscles of respiration.  CARDIOVASCULAR: S1, S2 normal. No murmurs, rubs, or gallops.  ABDOMEN: Soft, non-tender, non-distended. Bowel sounds present. No organomegaly or mass.  EXTREMITIES: No pedal edema, cyanosis, or clubbing.  NEUROLOGIC: Cranial nerves II through XII are intact. Muscle strength 5/5 in all extremities. Sensation intact. Gait not checked.  PSYCHIATRIC:  patient is alert and oriented x 3.  SKIN: No obvious rash, lesion, or ulcer.   DATA REVIEW:   CBC   Recent Labs Lab 05/15/16 0455  WBC 6.9  HGB 16.9  HCT 48.3  PLT 267    Chemistries   Recent Labs Lab 05/15/16 0455  NA 135  K 3.7  CL 97*  CO2 31  GLUCOSE 93  BUN 9  CREATININE 0.72  CALCIUM 9.2  AST 29  ALT 21  ALKPHOS 63  BILITOT 0.5    Microbiology Results   No results found for this or any previous visit (from the past 240 hour(s)).  RADIOLOGY:  Dg Chest 2 View  05/15/2016  CLINICAL DATA:  Cough, shortness of  breath and wheezing. Chest and abdominal pain for 2 weeks. EXAM: CHEST  2 VIEW COMPARISON:  Radiographs and chest CT 12/09/2013 FINDINGS: The cardiomediastinal contours are normal. Mild hyperinflation and emphysema. Trace lingular atelectasis. Pulmonary vasculature is normal. No consolidation, pleural effusion, or pneumothorax. No acute osseous abnormalities are seen. IMPRESSION: Mild hyperinflation and emphysema. No evidence of superimposed acute process. Electronically Signed   By: Rubye Oaks M.D.   On: 05/15/2016 05:38     Management plans discussed with the patient, family and they are in agreement.  CODE STATUS:     Code Status Orders        Start     Ordered   05/15/16 1151  Full code   Continuous     05/15/16 1150    Code Status History    Date Active Date Inactive Code Status Order ID Comments User Context   This patient has a current code status but no historical code status.  TOTAL TIME TAKING CARE OF THIS PATIENT: 40 minutes.    Okie Bogacz M.D on 05/16/2016 at 12:18 PM  Between 7am to 6pm - Pager - [phone] After 6pm go to [email or web address] - password [email or web address] [facility]  [place] [facility]  Office  [phone]  CC: Primary care physician; Loa Socks Lindalou Hose, MD

## 2016-05-16 NOTE — Care Management (Signed)
Admitted to this facility with the diagnosis of COPD. Last seen Dr. Loa Socks at [facility] 2.5 weeks ago. Home health March 31st 2017. Doesn't remember name of agency. No skilled facility. No home oxygen. Good appetite. No falls. Takes care of all basic activities of daily living himself, doesn't drive. Friends help with errands, Qualifies for home oxygen. Discussed agencies. Chose [facility]. Feliberto Gottron, representative for [facility] updated. Will need home oxygen and nebulizer. Friend will transport Discharge to home per Dr. Enedina Finner Gwenette Greet RN MSN CCM Care Management [phone]

## 2016-05-16 NOTE — Progress Notes (Signed)
Discharge instructions given-Home O2 set up and brought in-escorted out via wheelchair.

## 2019-02-25 ENCOUNTER — Other Ambulatory Visit: Payer: Self-pay

## 2019-02-25 ENCOUNTER — Emergency Department
Admission: EM | Admit: 2019-02-25 | Discharge: 2019-02-25 | Disposition: A | Discharge status: Home / Self Care | Payer: Medicare PPO | Attending: Emergency Medicine | Admitting: Emergency Medicine

## 2019-02-25 ENCOUNTER — Encounter: Payer: Self-pay | Admitting: Emergency Medicine

## 2019-02-25 DIAGNOSIS — J449 Chronic obstructive pulmonary disease, unspecified: Secondary | ICD-10-CM

## 2019-02-25 DIAGNOSIS — Z79899 Other long term (current) drug therapy: Secondary | ICD-10-CM | POA: Insufficient documentation

## 2019-02-25 DIAGNOSIS — F17210 Nicotine dependence, cigarettes, uncomplicated: Secondary | ICD-10-CM | POA: Diagnosis not present

## 2019-02-25 DIAGNOSIS — E876 Hypokalemia: Secondary | ICD-10-CM | POA: Diagnosis not present

## 2019-02-25 DIAGNOSIS — R0602 Shortness of breath: Secondary | ICD-10-CM | POA: Diagnosis present

## 2019-02-25 DIAGNOSIS — I10 Essential (primary) hypertension: Secondary | ICD-10-CM | POA: Diagnosis not present

## 2019-02-25 LAB — URINALYSIS, COMPLETE (UACMP) WITH MICROSCOPIC
Bacteria, UA: NONE SEEN
Bilirubin Urine: NEGATIVE
Glucose, UA: NEGATIVE mg/dL
Hgb urine dipstick: NEGATIVE
Ketones, ur: 20 mg/dL — AB
Leukocytes,Ua: NEGATIVE
Nitrite: NEGATIVE
Protein, ur: 100 mg/dL — AB
Specific Gravity, Urine: 1.029 (ref 1.005–1.030)
Squamous Epithelial / LPF: NONE SEEN (ref 0–5)
pH: 6 (ref 5.0–8.0)

## 2019-02-25 LAB — COMPREHENSIVE METABOLIC PANEL
ALT: 11 U/L (ref 0–44)
AST: 20 U/L (ref 15–41)
Albumin: 3.6 g/dL (ref 3.5–5.0)
Alkaline Phosphatase: 59 U/L (ref 38–126)
Anion gap: 14 (ref 5–15)
BUN: 14 mg/dL (ref 8–23)
CO2: 27 mmol/L (ref 22–32)
Calcium: 9.1 mg/dL (ref 8.9–10.3)
Chloride: 91 mmol/L — ABNORMAL LOW (ref 98–111)
Creatinine, Ser: 0.53 mg/dL — ABNORMAL LOW (ref 0.61–1.24)
GFR calc Af Amer: >60 mL/min (ref >60)
GFR calc non Af Amer: >60 mL/min (ref >60)
Glucose, Bld: 91 mg/dL (ref 70–99)
Potassium: 2.9 mmol/L — ABNORMAL LOW (ref 3.5–5.1)
Sodium: 132 mmol/L — ABNORMAL LOW (ref 135–145)
Total Bilirubin: 1.8 mg/dL — ABNORMAL HIGH (ref 0.3–1.2)
Total Protein: 6.9 g/dL (ref 6.5–8.1)

## 2019-02-25 LAB — CBC
HCT: 50.5 % (ref 39.0–52.0)
Hemoglobin: 17.6 g/dL — ABNORMAL HIGH (ref 13.0–17.0)
MCH: 32.2 pg (ref 26.0–34.0)
MCHC: 34.9 g/dL (ref 30.0–36.0)
MCV: 92.5 fL (ref 80.0–100.0)
Platelets: 175 10*3/uL (ref 150–400)
RBC: 5.46 MIL/uL (ref 4.22–5.81)
RDW: 13.6 % (ref 11.5–15.5)
WBC: 12.1 10*3/uL — ABNORMAL HIGH (ref 4.0–10.5)
nRBC: 0 % (ref 0.0–0.2)

## 2019-02-25 LAB — LIPASE, BLOOD: Lipase: 26 U/L (ref 11–51)

## 2019-02-25 MED ORDER — IPRATROPIUM-ALBUTEROL 0.5-2.5 (3) MG/3ML IN SOLN
3.0000 mL | Freq: Once | RESPIRATORY_TRACT | Status: AC
  Administered 2019-02-25: 3 mL via RESPIRATORY_TRACT
  Filled 2019-02-25: qty 3

## 2019-02-25 MED ORDER — PREDNISONE 10 MG (21) PO TBPK: ORAL_TABLET | ORAL | 0 refills | Status: DC

## 2019-02-25 MED ORDER — POTASSIUM CHLORIDE CRYS ER 20 MEQ PO TBCR
40.0000 meq | EXTENDED_RELEASE_TABLET | Freq: Once | ORAL | Status: AC
  Administered 2019-02-25: 40 meq via ORAL
  Filled 2019-02-25: qty 2

## 2019-02-25 MED ORDER — METHYLPREDNISOLONE SODIUM SUCC 125 MG IJ SOLR
125.0000 mg | Freq: Once | INTRAMUSCULAR | Status: AC
  Administered 2019-02-25: 125 mg via INTRAVENOUS
  Filled 2019-02-25: qty 2

## 2019-02-25 MED ORDER — BENZONATATE 100 MG PO CAPS: 100.0000 mg | ORAL_CAPSULE | Freq: Four times a day (QID) | ORAL | 0 refills | Status: AC | PRN

## 2019-02-25 NOTE — ED Provider Notes (Signed)
[facility] Emergency Department Provider Note  ____________________________________________   I have reviewed the triage vital signs and the nursing notes.   HISTORY  Chief Complaint Shortness of breath, vomiting  History limited by: Not Limited   HPI Kurt Blankenship is a 63 y.o. male who presents to the emergency department today with concern for multiple medical complaints.  Primary months his implants to me of some shortness of breath, coughing and vomiting.  He states the symptoms have been present for the past roughly 4 or 5 days.  He states his cough and shortness of breath is been constant.  Is been trying his home inhalers without any relief.  He denies any associated chest pain.  He denies any bloody phlegm.  He has also been having some vomiting. States that his stomach has been hurting with the vomiting. The patient denies any fevers.   Has other complaint of some bloody urine. Denies history of the same.    Per medical record review patient has a history of COPD, HTN .   Past Medical History:  Diagnosis Date  . COPD (chronic obstructive pulmonary disease) (HCC)   . Hypertension     Patient Active Problem List   Diagnosis Date Noted  . Acute exacerbation of chronic obstructive pulmonary disease (COPD) (HCC) 05/15/2016    History reviewed. No pertinent surgical history.  Prior to Admission medications   Medication Sig Start Date End Date Taking? Authorizing Provider  albuterol (PROVENTIL HFA;VENTOLIN HFA) 108 (90 Base) MCG/ACT inhaler Inhale 2 puffs into the lungs every 4 (four) hours as needed for wheezing or shortness of breath.    [provider]  albuterol (PROVENTIL) (2.5 MG/3ML) 0.083% nebulizer solution Take 3 mLs (2.5 mg total) by nebulization every 4 (four) hours as needed for wheezing or shortness of breath. 05/16/16   Enedina Finner, MD  budesonide-formoterol [facility]) 160-4.5 MCG/ACT inhaler Inhale 2 puffs into the lungs 2  (two) times daily.    [provider]  chlorthalidone (HYGROTON) 25 MG tablet Take 25 mg by mouth daily.    [provider]  doxycycline (VIBRA-TABS) 100 MG tablet Take 1 tablet (100 mg total) by mouth every 12 (twelve) hours. 05/16/16   Enedina Finner, MD  Oxycodone HCl 10 MG TABS Take 10 mg by mouth every 6 (six) hours.    [provider]  predniSONE (DELTASONE) 10 MG tablet Take 50 mg daily and taper by 10 mg daily then stop 05/17/16   Enedina Finner, MD    Allergies Patient has no known allergies.  No family history on file.  Social History Social History   Tobacco Use  . Smoking status: Current Every Day Smoker    Packs/day: 1.50    Types: Cigarettes  . Smokeless tobacco: Never Used  Substance Use Topics  . Alcohol use: Yes  . Drug use: Never    Review of Systems Constitutional: No fever/chills Eyes: No visual changes. ENT: No sore throat. Cardiovascular: Denies chest pain. Respiratory: Positive for shortness of breath. Gastrointestinal: Positive for vomiting, positive for abdominal pain. Genitourinary: Positive of bloody urine.  Musculoskeletal: Negative for back pain. Skin: Negative for rash. Neurological: Negative for headaches, focal weakness or numbness.  ____________________________________________   PHYSICAL EXAM:  VITAL SIGNS: ED Triage Vitals  Enc Vitals Group     BP 02/25/19 1149 130/84     Pulse Rate 02/25/19 1149 (!) 52     Resp 02/25/19 1149 20     Temp 02/25/19 1149 98.4 F (  36.9 C)     Temp Source 02/25/19 1149 Oral     SpO2 02/25/19 1149 92 %     Weight 02/25/19 1150 171 lb (77.6 kg)     Height 02/25/19 1150 5\' 9"  (1.753 m)     Head Circumference --      Peak Flow --      Pain Score 02/25/19 1149 8   Constitutional: Alert and oriented.  Eyes: Conjunctivae are normal.  ENT      Head: Normocephalic and atraumatic.      Nose: No congestion/rhinnorhea.      Mouth/Throat: Mucous membranes are moist.      Neck: No  stridor. Hematological/Lymphatic/Immunilogical: No cervical lymphadenopathy. Cardiovascular: Normal rate, regular rhythm.  No murmurs, rubs, or gallops.  Respiratory: Diffuse expiratory wheezing.  Gastrointestinal: Soft and non tender. No rebound. No guarding.  Genitourinary: Deferred Musculoskeletal: Normal range of motion in all extremities. No lower extremity edema. Neurologic:  Normal speech and language. No gross focal neurologic deficits are appreciated.  Skin:  Skin is warm, dry and intact. No rash noted. Psychiatric: Mood and affect are normal. Speech and behavior are normal. Patient exhibits appropriate insight and judgment.  ____________________________________________    LABS (pertinent positives/negatives)  CMP na 132, k 2.9, cr 0.53 UA clear, 20 ketones, 6-10 rbc, 0-5 wbc Lipase 26 CBC wbc 12.1, hgb 17.6, plt 175 ____________________________________________   EKG  I, Phineas Semen, attending physician, personally viewed and interpreted this EKG  EKG Time: 1202 Rate: 106 Rhythm: sinus tachycardia with PACs Axis: normal Intervals: qtc 494 QRS: narrow ST changes: no st elevation Impression: abnormal ekg   ____________________________________________    RADIOLOGY  None  ____________________________________________   PROCEDURES  Procedures  ____________________________________________   INITIAL IMPRESSION / ASSESSMENT AND PLAN / ED COURSE  Pertinent labs & imaging results that were available during my care of the patient were reviewed by me and considered in my medical decision making (see chart for details).   Patient presented to the emergency department today with multiple medical complaints.  One point was for shortness of breath and cough.  This has been going on for the past few days.  Also complaining of some vomiting and weakness.  On exam patient had some diffuse expiratory wheezing.  Very minimal white count.  Patient had a blood work  ordered from triage.  Showed some hypokalemia which is likely secondary to his vomiting.  No focal findings on auscultation. Patient did feel better after breathing treatment and steroid. At this point I think COPD likely. Did discuss possibility of pneumonia with the patient. Had not received an x-ray from triage. At this time he felt comfortable deferring imaging, which I think is reasonable given good response to breathing treatments, lack of fever and no focal auscultation findings. Did however discuss with the patient pneumonia return precautions and possible future need of imaging. In terms of bloody urine discussed following up with primary care.   ____________________________________________   FINAL CLINICAL IMPRESSION(S) / ED DIAGNOSES  Final diagnoses:  Chronic obstructive pulmonary disease, unspecified COPD type (HCC)  Hypokalemia     Note: This dictation was prepared with Dragon dictation. Any transcriptional errors that result from this process are unintentional     Phineas Semen, MD 02/25/19 1440

## 2019-02-25 NOTE — Discharge Instructions (Signed)
Please seek medical attention for any high fevers, chest pain, shortness of breath, change in behavior, persistent vomiting, bloody stool or any other new or concerning symptoms.  

## 2019-02-25 NOTE — ED Triage Notes (Signed)
Pt in via POV, reports abdominal pain, N/VD x 3 days, also reports generalized weakness and hematuria.  Ambulatory to triage.  NAD noted at this time.

## 2019-04-04 ENCOUNTER — Other Ambulatory Visit: Payer: Self-pay

## 2019-04-04 ENCOUNTER — Emergency Department
Admission: EM | Admit: 2019-04-04 | Discharge: 2019-04-05 | Disposition: A | Discharge status: Other Acute Inpatient Hospital | Payer: Medicare PPO | Attending: Student in an Organized Health Care Education/Training Program | Admitting: Student in an Organized Health Care Education/Training Program

## 2019-04-04 ENCOUNTER — Emergency Department: Payer: Medicare PPO

## 2019-04-04 DIAGNOSIS — S0281XA Fracture of other specified skull and facial bones, right side, initial encounter for closed fracture: Secondary | ICD-10-CM | POA: Insufficient documentation

## 2019-04-04 DIAGNOSIS — F17210 Nicotine dependence, cigarettes, uncomplicated: Secondary | ICD-10-CM | POA: Insufficient documentation

## 2019-04-04 DIAGNOSIS — Y939 Activity, unspecified: Secondary | ICD-10-CM | POA: Diagnosis not present

## 2019-04-04 DIAGNOSIS — S199XXA Unspecified injury of neck, initial encounter: Secondary | ICD-10-CM | POA: Insufficient documentation

## 2019-04-04 DIAGNOSIS — R6884 Jaw pain: Secondary | ICD-10-CM | POA: Insufficient documentation

## 2019-04-04 DIAGNOSIS — S0990XA Unspecified injury of head, initial encounter: Secondary | ICD-10-CM | POA: Diagnosis present

## 2019-04-04 DIAGNOSIS — S0083XA Contusion of other part of head, initial encounter: Secondary | ICD-10-CM | POA: Diagnosis not present

## 2019-04-04 DIAGNOSIS — Y999 Unspecified external cause status: Secondary | ICD-10-CM | POA: Insufficient documentation

## 2019-04-04 DIAGNOSIS — I10 Essential (primary) hypertension: Secondary | ICD-10-CM | POA: Insufficient documentation

## 2019-04-04 DIAGNOSIS — Y929 Unspecified place or not applicable: Secondary | ICD-10-CM | POA: Diagnosis not present

## 2019-04-04 DIAGNOSIS — S0292XA Unspecified fracture of facial bones, initial encounter for closed fracture: Secondary | ICD-10-CM

## 2019-04-04 DIAGNOSIS — J449 Chronic obstructive pulmonary disease, unspecified: Secondary | ICD-10-CM | POA: Insufficient documentation

## 2019-04-04 DIAGNOSIS — Z79899 Other long term (current) drug therapy: Secondary | ICD-10-CM | POA: Insufficient documentation

## 2019-04-04 MED ORDER — ONDANSETRON 4 MG PO TBDP
4.0000 mg | ORAL_TABLET | Freq: Once | ORAL | Status: AC
  Administered 2019-04-04: 4 mg via ORAL
  Filled 2019-04-04: qty 1

## 2019-04-04 MED ORDER — OXYCODONE-ACETAMINOPHEN 5-325 MG PO TABS
1.0000 | ORAL_TABLET | Freq: Once | ORAL | Status: AC
  Administered 2019-04-04: 1 via ORAL
  Filled 2019-04-04: qty 1

## 2019-04-04 NOTE — ED Notes (Signed)
facesheet faxed to [facility] 
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
## 2019-04-04 NOTE — ED Notes (Signed)
Patient transported to CT 

## 2019-04-04 NOTE — ED Notes (Signed)
[employer] speaking with pt as per pt request.
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
## 2019-04-04 NOTE — ED Notes (Addendum)
Pt denies loc, but states is dizzy. Pt denies vomiting. Pt states had a headache and right sided facial pain. No drainage noted from nares, ears. Pt with perrl 72mm brisk, no subconjunctival hemmorrhage noted.

## 2019-04-04 NOTE — ED Notes (Signed)
Patient states, "I'm about to die of thirst." Patient was notified that he will need to be cleared by the doctor before having anything to eat or drink.

## 2019-04-04 NOTE — ED Notes (Signed)
Pt updated on results of ct scan and possible treatment avenues. Pt verbalizes understanding. Pt states other than water with pills today given in ed he has not consumed any po since last pm.

## 2019-04-05 DIAGNOSIS — S0281XA Fracture of other specified skull and facial bones, right side, initial encounter for closed fracture: Secondary | ICD-10-CM | POA: Diagnosis not present

## 2019-04-05 MED ORDER — PROMETHAZINE HCL 25 MG/ML IJ SOLN
12.5000 mg | Freq: Four times a day (QID) | INTRAMUSCULAR | Status: DC | PRN
  Administered 2019-04-05: 12.5 mg via INTRAVENOUS

## 2019-04-05 MED ORDER — MORPHINE SULFATE (PF) 4 MG/ML IV SOLN
4.0000 mg | INTRAVENOUS | Status: DC | PRN
  Administered 2019-04-05: 4 mg via INTRAVENOUS
  Filled 2019-04-05: qty 1

## 2019-04-05 NOTE — ED Notes (Signed)
Pt placed on oxygen at 2lpm via [place] for pox of 90% prior to morphine administration. Pt with rebound pox to 93% with oxygen.

## 2019-04-05 NOTE — ED Notes (Signed)
Pt requesting additional pain and nausea medication. md notified. Report to dee, rn.

## 2019-05-01 ENCOUNTER — Other Ambulatory Visit: Payer: Self-pay

## 2019-05-01 ENCOUNTER — Emergency Department
Admission: EM | Admit: 2019-05-01 | Discharge: 2019-05-01 | Disposition: A | Discharge status: Home / Self Care | Payer: Medicare PPO | Attending: Emergency Medicine | Admitting: Emergency Medicine

## 2019-05-01 ENCOUNTER — Encounter: Payer: Self-pay | Admitting: Emergency Medicine

## 2019-05-01 DIAGNOSIS — R6884 Jaw pain: Secondary | ICD-10-CM | POA: Diagnosis not present

## 2019-05-01 DIAGNOSIS — Z5321 Procedure and treatment not carried out due to patient leaving prior to being seen by health care provider: Secondary | ICD-10-CM | POA: Insufficient documentation

## 2019-05-01 LAB — COMPREHENSIVE METABOLIC PANEL
ALT: 11 U/L (ref 0–44)
AST: 19 U/L (ref 15–41)
Albumin: 3.9 g/dL (ref 3.5–5.0)
Alkaline Phosphatase: 67 U/L (ref 38–126)
Anion gap: 10 (ref 5–15)
BUN: <5 mg/dL — ABNORMAL LOW (ref 8–23)
CO2: 27 mmol/L (ref 22–32)
Calcium: 9.1 mg/dL (ref 8.9–10.3)
Chloride: 97 mmol/L — ABNORMAL LOW (ref 98–111)
Creatinine, Ser: 0.51 mg/dL — ABNORMAL LOW (ref 0.61–1.24)
GFR calc Af Amer: >60 mL/min (ref >60)
GFR calc non Af Amer: >60 mL/min (ref >60)
Glucose, Bld: 118 mg/dL — ABNORMAL HIGH (ref 70–99)
Potassium: 3.5 mmol/L (ref 3.5–5.1)
Sodium: 134 mmol/L — ABNORMAL LOW (ref 135–145)
Total Bilirubin: 0.6 mg/dL (ref 0.3–1.2)
Total Protein: 6.9 g/dL (ref 6.5–8.1)

## 2019-05-01 LAB — CBC WITH DIFFERENTIAL/PLATELET
Abs Immature Granulocytes: 0.02 10*3/uL (ref 0.00–0.07)
Basophils Absolute: 0.1 10*3/uL (ref 0.0–0.1)
Basophils Relative: 1 %
Eosinophils Absolute: 0.4 10*3/uL (ref 0.0–0.5)
Eosinophils Relative: 5 %
HCT: 51.9 % (ref 39.0–52.0)
Hemoglobin: 18 g/dL — ABNORMAL HIGH (ref 13.0–17.0)
Immature Granulocytes: 0 %
Lymphocytes Relative: 29 %
Lymphs Abs: 2.1 10*3/uL (ref 0.7–4.0)
MCH: 33.2 pg (ref 26.0–34.0)
MCHC: 34.7 g/dL (ref 30.0–36.0)
MCV: 95.8 fL (ref 80.0–100.0)
Monocytes Absolute: 0.6 10*3/uL (ref 0.1–1.0)
Monocytes Relative: 8 %
Neutro Abs: 4.1 10*3/uL (ref 1.7–7.7)
Neutrophils Relative %: 57 %
Platelets: 201 10*3/uL (ref 150–400)
RBC: 5.42 MIL/uL (ref 4.22–5.81)
RDW: 13.6 % (ref 11.5–15.5)
WBC: 7.2 10*3/uL (ref 4.0–10.5)
nRBC: 0 % (ref 0.0–0.2)

## 2019-05-01 NOTE — ED Notes (Signed)
Called patient to re-check vital signs.  No response.

## 2019-05-01 NOTE — ED Triage Notes (Signed)
Pt presents via pov from home with right upper jaw pain. He reports that he had jaw surgery 2-3 weeks ago and that he thinks it is infected. Pt states he has been unable to sleep due to the pain.

## 2019-05-01 NOTE — ED Notes (Signed)
Called for patient. No response. 

## 2019-05-04 ENCOUNTER — Telehealth: Payer: Self-pay | Admitting: Emergency Medicine

## 2019-05-04 NOTE — Telephone Encounter (Signed)
Called patient due to lwot to inquire about condition and follow up plans. Left message.   

## 2020-05-01 ENCOUNTER — Emergency Department: Payer: Medicare PPO

## 2020-05-01 ENCOUNTER — Other Ambulatory Visit: Payer: Self-pay

## 2020-05-01 ENCOUNTER — Encounter: Payer: Self-pay | Admitting: Emergency Medicine

## 2020-05-01 ENCOUNTER — Emergency Department
Admission: EM | Admit: 2020-05-01 | Discharge: 2020-05-01 | Disposition: A | Discharge status: Home / Self Care | Payer: Medicare PPO | Attending: Emergency Medicine | Admitting: Emergency Medicine

## 2020-05-01 DIAGNOSIS — J449 Chronic obstructive pulmonary disease, unspecified: Secondary | ICD-10-CM | POA: Insufficient documentation

## 2020-05-01 DIAGNOSIS — Z96651 Presence of right artificial knee joint: Secondary | ICD-10-CM | POA: Diagnosis not present

## 2020-05-01 DIAGNOSIS — M25562 Pain in left knee: Secondary | ICD-10-CM | POA: Diagnosis present

## 2020-05-01 DIAGNOSIS — I10 Essential (primary) hypertension: Secondary | ICD-10-CM | POA: Diagnosis not present

## 2020-05-01 DIAGNOSIS — Z96652 Presence of left artificial knee joint: Secondary | ICD-10-CM | POA: Insufficient documentation

## 2020-05-01 DIAGNOSIS — F17210 Nicotine dependence, cigarettes, uncomplicated: Secondary | ICD-10-CM | POA: Insufficient documentation

## 2020-05-01 MED ORDER — OXYCODONE-ACETAMINOPHEN 5-325 MG PO TABS
1.0000 | ORAL_TABLET | Freq: Once | ORAL | Status: AC
  Administered 2020-05-01: 1 via ORAL
  Filled 2020-05-01: qty 1
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
## 2020-05-01 NOTE — ED Triage Notes (Signed)
See first nurse note. Pt denies LOC or head trauma.

## 2020-05-01 NOTE — ED Provider Notes (Signed)
Emergency Department Provider Note  ____________________________________________  Time seen: Approximately 10:53 PM  I have reviewed the triage vital signs and the nursing notes.   HISTORY  Chief Complaint Fall   Historian Patient     HPI Kurt Blankenship is a 64 y.o. male presents to the emergency department with acute left knee pain.  Patient states that his knee popped and it gave way and he fell onto concrete.  He denies hitting his head or his neck.  No numbness or tingling in the lower extremities.  He is status post right and left total knee arthroplasty.  Patient states that he has not been able to bear weight since injury occurred.   Past Medical History:  Diagnosis Date  . COPD (chronic obstructive pulmonary disease) (HCC)   . Hypertension      Immunizations up to date:  Yes.     Past Medical History:  Diagnosis Date  . COPD (chronic obstructive pulmonary disease) (HCC)   . Hypertension     Patient Active Problem List   Diagnosis Date Noted  . Acute exacerbation of chronic obstructive pulmonary disease (COPD) (HCC) 05/15/2016    History reviewed. No pertinent surgical history.  Prior to Admission medications   Medication Sig Start Date End Date Taking? Authorizing Provider  albuterol (PROVENTIL HFA;VENTOLIN HFA) 108 (90 Base) MCG/ACT inhaler Inhale 2 puffs into the lungs every 4 (four) hours as needed for wheezing or shortness of breath.    [provider]  albuterol (PROVENTIL) (2.5 MG/3ML) 0.083% nebulizer solution Take 3 mLs (2.5 mg total) by nebulization every 4 (four) hours as needed for wheezing or shortness of breath. 05/16/16   Enedina Finner, MD  budesonide-formoterol [facility]) 160-4.5 MCG/ACT inhaler Inhale 2 puffs into the lungs 2 (two) times daily.    [provider]  chlorthalidone (HYGROTON) 25 MG tablet Take 25 mg by mouth daily.    [provider]  doxycycline (VIBRA-TABS) 100 MG tablet Take 1 tablet (100 mg  total) by mouth every 12 (twelve) hours. 05/16/16   Enedina Finner, MD  Oxycodone HCl 10 MG TABS Take 10 mg by mouth every 6 (six) hours.    [provider]  predniSONE (DELTASONE) 10 MG tablet Take 50 mg daily and taper by 10 mg daily then stop 05/17/16   Enedina Finner, MD  predniSONE (STERAPRED UNI-PAK 21 TAB) 10 MG (21) TBPK tablet Per packaging instructions 02/25/19   Phineas Semen, MD    Allergies Patient has no known allergies.  No family history on file.  Social History Social History   Tobacco Use  . Smoking status: Current Every Day Smoker    Packs/day: 1.50    Types: Cigarettes  . Smokeless tobacco: Never Used  Substance Use Topics  . Alcohol use: Yes    Alcohol/week: 1.0 - 2.0 standard drinks    Types: 1 - 2 Cans of beer per week    Comment: 1-2 beer daily  . Drug use: Never     Review of Systems  Constitutional: No fever/chills Eyes:  No discharge ENT: No upper respiratory complaints. Respiratory: no cough. No SOB/ use of accessory muscles to breath Gastrointestinal:   No nausea, no vomiting.  No diarrhea.  No constipation. Musculoskeletal: Patient has left knee pain.  Skin: Negative for rash, abrasions, lacerations, ecchymosis.    ____________________________________________   PHYSICAL EXAM:  VITAL SIGNS: ED Triage Vitals  Enc Vitals Group     BP 05/01/20 2119 127/78     Pulse Rate 05/01/20  2119 88     Resp 05/01/20 2119 18     Temp 05/01/20 2119 98.4 F (36.9 C)     Temp Source 05/01/20 2119 Oral     SpO2 05/01/20 2119 95 %     Weight 05/01/20 2120 166 lb (75.3 kg)     Height 05/01/20 2120 5\' 9"  (1.753 m)     Head Circumference --      Peak Flow --      Pain Score 05/01/20 2119 8     Pain Loc --      Pain Edu? --      Excl. in [place]? --      Constitutional: Alert and oriented. Well appearing and in no acute distress. Eyes: Conjunctivae are normal. PERRL. EOMI. Head: Atraumatic. Cardiovascular: Normal rate, regular rhythm. Normal S1 and  S2.  Good peripheral circulation. Respiratory: Normal respiratory effort without tachypnea or retractions. Lungs CTAB. Good air entry to the bases with no decreased or absent breath sounds Gastrointestinal: Bowel sounds x 4 quadrants. Soft and nontender to palpation. No guarding or rigidity. No distention. Musculoskeletal: Patient's left knee appears mildly effused.  No overlying abrasions or lacerations.  Palpable dorsalis pedis pulse, left. Neurologic:  Normal for age. No gross focal neurologic deficits are appreciated.  Skin:  Skin is warm, dry and intact. No rash noted. Psychiatric: Mood and affect are normal for age. Speech and behavior are normal.   ____________________________________________   LABS (all labs ordered are listed, but only abnormal results are displayed)  Labs Reviewed - No data to display ____________________________________________  EKG   ____________________________________________  RADIOLOGY 2120, personally viewed and evaluated these images (plain radiographs) as part of my medical decision making, as well as reviewing the written report by the radiologist.  DG Knee Complete 4 Views Left  Result Date: 05/01/2020 CLINICAL DATA:  Fall EXAM: LEFT KNEE - COMPLETE 4+ VIEW COMPARISON:  None. FINDINGS: Left total knee arthroplasty in normal alignment. No periprosthetic fracture or loosening. No joint effusion. IMPRESSION: No acute features of left total knee arthroplasty. Electronically Signed   By: 05/03/2020 M.D.   On: 05/01/2020 22:03    ____________________________________________    PROCEDURES  Procedure(s) performed:     Procedures     Medications  oxyCODONE-acetaminophen (PERCOCET/ROXICET) 5-325 MG per tablet 1 tablet (has no administration in time range)     ____________________________________________   INITIAL IMPRESSION / ASSESSMENT AND PLAN / ED COURSE  Pertinent labs & imaging results that were available during my  care of the patient were reviewed by me and considered in my medical decision making (see chart for details).      Assessment and plan Knee pain 64 year old male presents to the emergency department with acute left knee pain after a mechanical fall.  Vital signs are reassuring at triage.  On physical exam, patient's left knee appeared mildly edematous.  Provocative testing was limited due to patient's discomfort.  I reviewed patient's history and the drug database and patient has pain medication prescribed chronically.  Patient was given oxycodone in the emerge department and patient was advised to follow-up with his pain management physician if he needs additional pain medication.  Return precautions were given to return with new or worsened numbness.  All patient questions were answered.  ____________________________________________  FINAL CLINICAL IMPRESSION(S) / ED DIAGNOSES  Final diagnoses:  Acute pain of left knee      NEW MEDICATIONS STARTED DURING THIS VISIT:  ED Discharge Orders  None          This chart was dictated using voice recognition software/Dragon. Despite best efforts to proofread, errors can occur which can change the meaning. Any change was purely unintentional.     Lannie Fields, PA-C 05/01/20 2257    Drenda Freeze, MD 05/01/20 [phone]

## 2020-05-07 ENCOUNTER — Emergency Department: Payer: Medicare PPO

## 2020-05-07 ENCOUNTER — Other Ambulatory Visit: Payer: Self-pay

## 2020-05-07 ENCOUNTER — Emergency Department
Admission: EM | Admit: 2020-05-07 | Discharge: 2020-05-07 | Disposition: A | Discharge status: Home / Self Care | Payer: Medicare PPO | Attending: Emergency Medicine | Admitting: Emergency Medicine

## 2020-05-07 ENCOUNTER — Encounter: Payer: Self-pay | Admitting: Emergency Medicine

## 2020-05-07 DIAGNOSIS — F10920 Alcohol use, unspecified with intoxication, uncomplicated: Secondary | ICD-10-CM | POA: Diagnosis not present

## 2020-05-07 DIAGNOSIS — Y9389 Activity, other specified: Secondary | ICD-10-CM | POA: Diagnosis not present

## 2020-05-07 DIAGNOSIS — Z79899 Other long term (current) drug therapy: Secondary | ICD-10-CM | POA: Insufficient documentation

## 2020-05-07 DIAGNOSIS — S161XXA Strain of muscle, fascia and tendon at neck level, initial encounter: Secondary | ICD-10-CM | POA: Diagnosis not present

## 2020-05-07 DIAGNOSIS — S0990XA Unspecified injury of head, initial encounter: Secondary | ICD-10-CM

## 2020-05-07 DIAGNOSIS — R55 Syncope and collapse: Secondary | ICD-10-CM | POA: Diagnosis not present

## 2020-05-07 DIAGNOSIS — Y9289 Other specified places as the place of occurrence of the external cause: Secondary | ICD-10-CM | POA: Insufficient documentation

## 2020-05-07 DIAGNOSIS — R0602 Shortness of breath: Secondary | ICD-10-CM | POA: Insufficient documentation

## 2020-05-07 DIAGNOSIS — J449 Chronic obstructive pulmonary disease, unspecified: Secondary | ICD-10-CM | POA: Insufficient documentation

## 2020-05-07 DIAGNOSIS — W19XXXA Unspecified fall, initial encounter: Secondary | ICD-10-CM | POA: Diagnosis not present

## 2020-05-07 DIAGNOSIS — F17210 Nicotine dependence, cigarettes, uncomplicated: Secondary | ICD-10-CM | POA: Diagnosis not present

## 2020-05-07 DIAGNOSIS — Y999 Unspecified external cause status: Secondary | ICD-10-CM | POA: Insufficient documentation

## 2020-05-07 DIAGNOSIS — I10 Essential (primary) hypertension: Secondary | ICD-10-CM | POA: Insufficient documentation

## 2020-05-07 DIAGNOSIS — S199XXA Unspecified injury of neck, initial encounter: Secondary | ICD-10-CM | POA: Diagnosis present

## 2020-05-07 LAB — CBC WITH DIFFERENTIAL/PLATELET
Abs Immature Granulocytes: 0.03 K/uL (ref 0.00–0.07)
Basophils Absolute: 0.1 K/uL (ref 0.0–0.1)
Basophils Relative: 1 %
Eosinophils Absolute: 0.1 K/uL (ref 0.0–0.5)
Eosinophils Relative: 1 %
HCT: 47.6 % (ref 39.0–52.0)
Hemoglobin: 16.6 g/dL (ref 13.0–17.0)
Immature Granulocytes: 0 %
Lymphocytes Relative: 24 %
Lymphs Abs: 2.2 K/uL (ref 0.7–4.0)
MCH: 34.1 pg — ABNORMAL HIGH (ref 26.0–34.0)
MCHC: 34.9 g/dL (ref 30.0–36.0)
MCV: 97.7 fL (ref 80.0–100.0)
Monocytes Absolute: 0.8 K/uL (ref 0.1–1.0)
Monocytes Relative: 9 %
Neutro Abs: 5.9 K/uL (ref 1.7–7.7)
Neutrophils Relative %: 65 %
Platelets: 238 K/uL (ref 150–400)
RBC: 4.87 MIL/uL (ref 4.22–5.81)
RDW: 13.8 % (ref 11.5–15.5)
WBC: 9.1 K/uL (ref 4.0–10.5)
nRBC: 0 % (ref 0.0–0.2)

## 2020-05-07 LAB — BRAIN NATRIURETIC PEPTIDE: B Natriuretic Peptide: 254.9 pg/mL — ABNORMAL HIGH (ref 0.0–100.0)

## 2020-05-07 LAB — BASIC METABOLIC PANEL WITH GFR
Anion gap: 12 (ref 5–15)
BUN: 5 mg/dL — ABNORMAL LOW (ref 8–23)
CO2: 25 mmol/L (ref 22–32)
Calcium: 8.9 mg/dL (ref 8.9–10.3)
Chloride: 100 mmol/L (ref 98–111)
Creatinine, Ser: 0.78 mg/dL (ref 0.61–1.24)
GFR calc Af Amer: >60 mL/min (ref >60)
GFR calc non Af Amer: >60 mL/min (ref >60)
Glucose, Bld: 96 mg/dL (ref 70–99)
Potassium: 3.4 mmol/L — ABNORMAL LOW (ref 3.5–5.1)
Sodium: 137 mmol/L (ref 135–145)

## 2020-05-07 LAB — TROPONIN I (HIGH SENSITIVITY): Troponin I (High Sensitivity): 90 ng/L — ABNORMAL HIGH (ref <18)

## 2020-05-07 LAB — ETHANOL: Alcohol, Ethyl (B): 202 mg/dL — ABNORMAL HIGH (ref <10)

## 2020-05-07 MED ORDER — IPRATROPIUM-ALBUTEROL 0.5-2.5 (3) MG/3ML IN SOLN
3.0000 mL | Freq: Once | RESPIRATORY_TRACT | Status: AC
  Administered 2020-05-07: 3 mL via RESPIRATORY_TRACT
  Filled 2020-05-07: qty 3

## 2020-05-07 MED ORDER — ACETAMINOPHEN 500 MG PO TABS
1000.0000 mg | ORAL_TABLET | Freq: Once | ORAL | Status: AC
  Administered 2020-05-07: 1000 mg via ORAL
  Filled 2020-05-07: qty 2

## 2020-05-07 MED ORDER — KETOROLAC TROMETHAMINE 30 MG/ML IJ SOLN
15.0000 mg | INTRAMUSCULAR | Status: AC
  Administered 2020-05-07: 15 mg via INTRAVENOUS
  Filled 2020-05-07: qty 1

## 2020-05-07 NOTE — ED Triage Notes (Signed)
Patient states that his knees gave out and that he fell and hit his head on concrete floor. Per family positive for LOC. Patient with complaint of dizziness since the fall.

## 2020-05-07 NOTE — ED Provider Notes (Signed)
Procedures     ----------------------------------------- 8:03 AM on 05/07/2020 -----------------------------------------  Patient is awake and alert, vital signs unremarkable.  He is 89% on room air, but states that he uses 2 L nasal cannula oxygen at all times at home.  Appears to be at his baseline for COPD and mental status.  Not intoxicated no evidence of withdrawal.  His son can come pick him up to take him home.  He is tolerating oral intake, not slurring his speech.  I will give IV Toradol x1 for his neck pain from cervical strain from the fall.  Imaging is unremarkable, chest x-ray unremarkable.  He stable for discharge.    Sharman Cheek, MD 05/07/20 [phone]
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
## 2020-06-08 ENCOUNTER — Observation Stay (HOSPITAL_BASED_OUTPATIENT_CLINIC_OR_DEPARTMENT_OTHER)
Admit: 2020-06-08 | Discharge: 2020-06-08 | Disposition: A | Discharge status: Home / Self Care | Payer: Medicare Other | Attending: Internal Medicine | Admitting: Internal Medicine

## 2020-06-08 ENCOUNTER — Other Ambulatory Visit: Payer: Self-pay

## 2020-06-08 ENCOUNTER — Emergency Department: Payer: Medicare Other

## 2020-06-08 ENCOUNTER — Inpatient Hospital Stay
Admission: EM | Admit: 2020-06-08 | Discharge: 2020-06-10 | DRG: 287 | Disposition: A | Discharge status: Home / Self Care | Payer: Medicare Other | Attending: Internal Medicine | Admitting: Internal Medicine

## 2020-06-08 DIAGNOSIS — W010XXA Fall on same level from slipping, tripping and stumbling without subsequent striking against object, initial encounter: Secondary | ICD-10-CM | POA: Diagnosis present

## 2020-06-08 DIAGNOSIS — I110 Hypertensive heart disease with heart failure: Secondary | ICD-10-CM | POA: Diagnosis present

## 2020-06-08 DIAGNOSIS — Z8249 Family history of ischemic heart disease and other diseases of the circulatory system: Secondary | ICD-10-CM

## 2020-06-08 DIAGNOSIS — Z825 Family history of asthma and other chronic lower respiratory diseases: Secondary | ICD-10-CM

## 2020-06-08 DIAGNOSIS — S060X9A Concussion with loss of consciousness of unspecified duration, initial encounter: Secondary | ICD-10-CM | POA: Diagnosis present

## 2020-06-08 DIAGNOSIS — G8929 Other chronic pain: Secondary | ICD-10-CM | POA: Diagnosis present

## 2020-06-08 DIAGNOSIS — J449 Chronic obstructive pulmonary disease, unspecified: Secondary | ICD-10-CM | POA: Diagnosis present

## 2020-06-08 DIAGNOSIS — I10 Essential (primary) hypertension: Secondary | ICD-10-CM | POA: Diagnosis present

## 2020-06-08 DIAGNOSIS — Z20822 Contact with and (suspected) exposure to covid-19: Secondary | ICD-10-CM | POA: Diagnosis present

## 2020-06-08 DIAGNOSIS — I951 Orthostatic hypotension: Principal | ICD-10-CM | POA: Diagnosis present

## 2020-06-08 DIAGNOSIS — Z7951 Long term (current) use of inhaled steroids: Secondary | ICD-10-CM

## 2020-06-08 DIAGNOSIS — I428 Other cardiomyopathies: Secondary | ICD-10-CM | POA: Diagnosis present

## 2020-06-08 DIAGNOSIS — Z716 Tobacco abuse counseling: Secondary | ICD-10-CM

## 2020-06-08 DIAGNOSIS — Y905 Blood alcohol level of 100-119 mg/100 ml: Secondary | ICD-10-CM | POA: Diagnosis present

## 2020-06-08 DIAGNOSIS — S0101XA Laceration without foreign body of scalp, initial encounter: Secondary | ICD-10-CM | POA: Diagnosis not present

## 2020-06-08 DIAGNOSIS — I429 Cardiomyopathy, unspecified: Secondary | ICD-10-CM

## 2020-06-08 DIAGNOSIS — F17210 Nicotine dependence, cigarettes, uncomplicated: Secondary | ICD-10-CM | POA: Diagnosis present

## 2020-06-08 DIAGNOSIS — R55 Syncope and collapse: Secondary | ICD-10-CM

## 2020-06-08 DIAGNOSIS — Z96653 Presence of artificial knee joint, bilateral: Secondary | ICD-10-CM | POA: Diagnosis present

## 2020-06-08 DIAGNOSIS — F10120 Alcohol abuse with intoxication, uncomplicated: Secondary | ICD-10-CM | POA: Diagnosis present

## 2020-06-08 DIAGNOSIS — R9431 Abnormal electrocardiogram [ECG] [EKG]: Secondary | ICD-10-CM

## 2020-06-08 DIAGNOSIS — E876 Hypokalemia: Secondary | ICD-10-CM | POA: Diagnosis not present

## 2020-06-08 DIAGNOSIS — Z7141 Alcohol abuse counseling and surveillance of alcoholic: Secondary | ICD-10-CM

## 2020-06-08 DIAGNOSIS — Z79891 Long term (current) use of opiate analgesic: Secondary | ICD-10-CM

## 2020-06-08 DIAGNOSIS — I2510 Atherosclerotic heart disease of native coronary artery without angina pectoris: Secondary | ICD-10-CM | POA: Diagnosis present

## 2020-06-08 DIAGNOSIS — E785 Hyperlipidemia, unspecified: Secondary | ICD-10-CM | POA: Diagnosis present

## 2020-06-08 DIAGNOSIS — F1490 Cocaine use, unspecified, uncomplicated: Secondary | ICD-10-CM | POA: Diagnosis present

## 2020-06-08 DIAGNOSIS — I5020 Unspecified systolic (congestive) heart failure: Secondary | ICD-10-CM | POA: Diagnosis present

## 2020-06-08 DIAGNOSIS — Z79899 Other long term (current) drug therapy: Secondary | ICD-10-CM

## 2020-06-08 HISTORY — DX: Low back pain, unspecified: M54.50

## 2020-06-08 HISTORY — DX: Unspecified osteoarthritis, unspecified site: M19.90

## 2020-06-08 HISTORY — DX: Tobacco use: Z72.0

## 2020-06-08 HISTORY — DX: Alcohol use, unspecified, uncomplicated: F10.90

## 2020-06-08 HISTORY — DX: Other specified health status: Z78.9

## 2020-06-08 HISTORY — DX: Other problems related to lifestyle: Z72.89

## 2020-06-08 LAB — COMPREHENSIVE METABOLIC PANEL
ALT: 15 U/L (ref 0–44)
ALT: 17 U/L (ref 0–44)
AST: 21 U/L (ref 15–41)
AST: 19 U/L (ref 15–41)
Albumin: 3.3 g/dL — ABNORMAL LOW (ref 3.5–5.0)
Albumin: 3.4 g/dL — ABNORMAL LOW (ref 3.5–5.0)
Alkaline Phosphatase: 53 U/L (ref 38–126)
Alkaline Phosphatase: 60 U/L (ref 38–126)
Anion gap: 9 (ref 5–15)
Anion gap: 8 (ref 5–15)
BUN: 6 mg/dL — ABNORMAL LOW (ref 8–23)
BUN: 7 mg/dL — ABNORMAL LOW (ref 8–23)
CO2: 26 mmol/L (ref 22–32)
CO2: 28 mmol/L (ref 22–32)
Calcium: 8.7 mg/dL — ABNORMAL LOW (ref 8.9–10.3)
Calcium: 8.7 mg/dL — ABNORMAL LOW (ref 8.9–10.3)
Chloride: 104 mmol/L (ref 98–111)
Chloride: 101 mmol/L (ref 98–111)
Creatinine, Ser: 0.92 mg/dL (ref 0.61–1.24)
Creatinine, Ser: 0.81 mg/dL (ref 0.61–1.24)
GFR calc Af Amer: >60 mL/min (ref >60)
GFR calc Af Amer: >60 mL/min (ref >60)
GFR calc non Af Amer: >60 mL/min (ref >60)
GFR calc non Af Amer: >60 mL/min (ref >60)
Glucose, Bld: 126 mg/dL — ABNORMAL HIGH (ref 70–99)
Glucose, Bld: 127 mg/dL — ABNORMAL HIGH (ref 70–99)
Potassium: 3.4 mmol/L — ABNORMAL LOW (ref 3.5–5.1)
Potassium: 3.5 mmol/L (ref 3.5–5.1)
Sodium: 139 mmol/L (ref 135–145)
Sodium: 137 mmol/L (ref 135–145)
Total Bilirubin: 0.8 mg/dL (ref 0.3–1.2)
Total Bilirubin: 0.9 mg/dL (ref 0.3–1.2)
Total Protein: 5.7 g/dL — ABNORMAL LOW (ref 6.5–8.1)
Total Protein: 5.9 g/dL — ABNORMAL LOW (ref 6.5–8.1)

## 2020-06-08 LAB — ECHOCARDIOGRAM COMPLETE
Height: 70 in
Weight: 2673.74 oz

## 2020-06-08 LAB — CBC
HCT: 48.1 % (ref 39.0–52.0)
HCT: 47.9 % (ref 39.0–52.0)
Hemoglobin: 16.7 g/dL (ref 13.0–17.0)
Hemoglobin: 17 g/dL (ref 13.0–17.0)
MCH: 34 pg (ref 26.0–34.0)
MCH: 34.1 pg — ABNORMAL HIGH (ref 26.0–34.0)
MCHC: 34.7 g/dL (ref 30.0–36.0)
MCHC: 35.5 g/dL (ref 30.0–36.0)
MCV: 98 fL (ref 80.0–100.0)
MCV: 96 fL (ref 80.0–100.0)
Platelets: 189 10*3/uL (ref 150–400)
Platelets: 176 10*3/uL (ref 150–400)
RBC: 4.91 MIL/uL (ref 4.22–5.81)
RBC: 4.99 MIL/uL (ref 4.22–5.81)
RDW: 14 % (ref 11.5–15.5)
RDW: 14.3 % (ref 11.5–15.5)
WBC: 8 10*3/uL (ref 4.0–10.5)
WBC: 9.2 10*3/uL (ref 4.0–10.5)
nRBC: 0 % (ref 0.0–0.2)
nRBC: 0 % (ref 0.0–0.2)

## 2020-06-08 LAB — SARS CORONAVIRUS 2 BY RT PCR (HOSPITAL ORDER, PERFORMED IN ~~LOC~~ HOSPITAL LAB): SARS Coronavirus 2: NEGATIVE

## 2020-06-08 LAB — TROPONIN I (HIGH SENSITIVITY)
Troponin I (High Sensitivity): 31 ng/L — ABNORMAL HIGH (ref <18)
Troponin I (High Sensitivity): 25 ng/L — ABNORMAL HIGH (ref <18)

## 2020-06-08 LAB — MAGNESIUM: Magnesium: 1.7 mg/dL (ref 1.7–2.4)

## 2020-06-08 LAB — ETHANOL: Alcohol, Ethyl (B): 114 mg/dL — ABNORMAL HIGH (ref <10)

## 2020-06-08 LAB — PHOSPHORUS: Phosphorus: 3.7 mg/dL (ref 2.5–4.6)

## 2020-06-08 LAB — HIV ANTIBODY (ROUTINE TESTING W REFLEX): HIV Screen 4th Generation wRfx: NONREACTIVE

## 2020-06-08 MED ORDER — THIAMINE HCL 100 MG/ML IJ SOLN: 100.0000 mg | Freq: Every day | INTRAMUSCULAR | Status: DC

## 2020-06-08 MED ORDER — MECLIZINE HCL 12.5 MG PO TABS
12.5000 mg | ORAL_TABLET | Freq: Three times a day (TID) | ORAL | Status: DC | PRN
  Filled 2020-06-08: qty 1

## 2020-06-08 MED ORDER — LIDOCAINE-EPINEPHRINE 2 %-1:100000 IJ SOLN
INTRAMUSCULAR | Status: AC
  Administered 2020-06-08: 1 mL
  Filled 2020-06-08: qty 1

## 2020-06-08 MED ORDER — COLCHICINE 0.6 MG PO TABS
0.6000 mg | ORAL_TABLET | Freq: Every day | ORAL | Status: DC
  Administered 2020-06-08 – 2020-06-09 (×2): 0.6 mg via ORAL
  Filled 2020-06-08 (×4): qty 1

## 2020-06-08 MED ORDER — LORAZEPAM 1 MG PO TABS
1.0000 mg | ORAL_TABLET | ORAL | Status: DC | PRN
  Administered 2020-06-09: 1 mg via ORAL
  Filled 2020-06-08: qty 1

## 2020-06-08 MED ORDER — ADULT MULTIVITAMIN W/MINERALS CH
1.0000 | ORAL_TABLET | Freq: Every day | ORAL | Status: DC
  Administered 2020-06-08 – 2020-06-10 (×3): 1 via ORAL
  Filled 2020-06-08 (×3): qty 1

## 2020-06-08 MED ORDER — THIAMINE HCL 100 MG PO TABS
100.0000 mg | ORAL_TABLET | Freq: Every day | ORAL | Status: DC
  Administered 2020-06-08 – 2020-06-10 (×3): 100 mg via ORAL
  Filled 2020-06-08 (×3): qty 1

## 2020-06-08 MED ORDER — OXYCODONE-ACETAMINOPHEN 5-325 MG PO TABS
1.0000 | ORAL_TABLET | Freq: Once | ORAL | Status: AC
  Administered 2020-06-08: 1 via ORAL
  Filled 2020-06-08: qty 1

## 2020-06-08 MED ORDER — FOLIC ACID 1 MG PO TABS
1.0000 mg | ORAL_TABLET | Freq: Every day | ORAL | Status: DC
  Administered 2020-06-08 – 2020-06-10 (×3): 1 mg via ORAL
  Filled 2020-06-08 (×3): qty 1

## 2020-06-08 MED ORDER — SODIUM CHLORIDE 0.9% FLUSH
3.0000 mL | Freq: Two times a day (BID) | INTRAVENOUS | Status: DC
  Administered 2020-06-08 – 2020-06-09 (×3): 3 mL via INTRAVENOUS

## 2020-06-08 MED ORDER — POTASSIUM CHLORIDE CRYS ER 20 MEQ PO TBCR
40.0000 meq | EXTENDED_RELEASE_TABLET | Freq: Once | ORAL | Status: AC
  Administered 2020-06-08: 40 meq via ORAL
  Filled 2020-06-08: qty 2

## 2020-06-08 MED ORDER — OXYCODONE HCL 5 MG PO TABS
10.0000 mg | ORAL_TABLET | Freq: Four times a day (QID) | ORAL | Status: DC
  Administered 2020-06-08 – 2020-06-09 (×5): 10 mg via ORAL
  Filled 2020-06-08 (×5): qty 2

## 2020-06-08 MED ORDER — GABAPENTIN 300 MG PO CAPS
300.0000 mg | ORAL_CAPSULE | Freq: Two times a day (BID) | ORAL | Status: DC
  Administered 2020-06-08 – 2020-06-10 (×5): 300 mg via ORAL
  Filled 2020-06-08 (×3): qty 1
  Filled 2020-06-08 (×2): qty 3

## 2020-06-08 MED ORDER — ATORVASTATIN CALCIUM 20 MG PO TABS
20.0000 mg | ORAL_TABLET | Freq: Every day | ORAL | Status: DC
  Administered 2020-06-08 – 2020-06-10 (×3): 20 mg via ORAL
  Filled 2020-06-08 (×3): qty 1

## 2020-06-08 MED ORDER — MAGNESIUM SULFATE 2 GM/50ML IV SOLN
2.0000 g | Freq: Once | INTRAVENOUS | Status: AC
  Administered 2020-06-08: 2 g via INTRAVENOUS
  Filled 2020-06-08: qty 50

## 2020-06-08 MED ORDER — LORAZEPAM 2 MG/ML IJ SOLN
1.0000 mg | INTRAMUSCULAR | Status: DC | PRN
  Administered 2020-06-10: 2 mg via INTRAVENOUS
  Filled 2020-06-08: qty 2

## 2020-06-08 MED ORDER — CARVEDILOL 3.125 MG PO TABS
3.1250 mg | ORAL_TABLET | Freq: Two times a day (BID) | ORAL | Status: DC
  Administered 2020-06-09 (×2): 3.125 mg via ORAL
  Filled 2020-06-08 (×4): qty 1

## 2020-06-08 MED ORDER — NICOTINE 21 MG/24HR TD PT24
21.0000 mg | MEDICATED_PATCH | Freq: Every day | TRANSDERMAL | Status: DC
  Administered 2020-06-08 – 2020-06-10 (×3): 21 mg via TRANSDERMAL
  Filled 2020-06-08 (×3): qty 1

## 2020-06-08 MED ORDER — SODIUM CHLORIDE 0.9 % IV SOLN: INTRAVENOUS | Status: DC

## 2020-06-08 NOTE — Consult Note (Signed)
Cardiology Consult    Patient ID: DANGER MORSCH MRN: [id number], DOB/AGE: 63-May-1957   Admit date: 06/08/2020 Date of Consult: 06/08/2020  Primary Physician: Marshell Garfinkel, MD Primary Cardiologist: Yvonne Kendall, MD - new Requesting Provider: Arvid Right, MD  Patient Profile    Kurt Blankenship is a 64 y.o. male with a history of HTN, tob abuse, COPD, alcohol use, and chronic low back pain req narcotics, who is being seen today for the evaluation of fall w/ loss of consciousness at the request of Dr. Joylene Igo.  Past Medical History   Past Medical History:  Diagnosis Date  . Alcohol use   . COPD (chronic obstructive pulmonary disease) (HCC)   . Degenerative joint disease    a. S/p bilateral knee replacements.  . Hypertension   . Low back pain   . Tobacco abuse     Past Surgical History:  Procedure Laterality Date  . Bilateral knee replacements       Allergies  No Known Allergies  History of Present Illness    64 year old male with the above past medical history including hypertension, tobacco abuse, COPD, alcohol use, and chronic low back pain requiring oral narcotic therapy.  He has no prior history of coronary artery disease.  He does have some family history of CAD with a brother dying in his 30s of myocardial infarction.  His mother also died in her late 28s with what sounds like heart failure.  He lives locally with a friend and does not routinely exercise.  He does have degenerative joint disease and is status post bilateral knee replacements.  He notes that sometimes, his left knee "locks up" on him.  He was in his usual state of health until the early morning hours of June 23, when he was walking in his kitchen sometime around midnight and noted that his left knee locked up on him.  In this setting, he stumbled and subsequently fell backwards.  He says he remembers falling and tried to stop his fall but could not really get his arms out and his head struck the floor.  He  believes his son must have heard him fall.  He apparently lost consciousness after striking the floor and EMS was called.  Patient does not remember anything until he regained consciousness in the ambulance.  He noted significant head and left shoulder pain was taken to the emergency department.  Here, ECG notable for sinus rhythm at 81 with nonspecific T changes and prolonged QT at 449 ms in the setting of mild hypokalemia (3.5), hypomagnesemia (1.7), and hypocalcemia (8.7).  High-sensitivity troponin was initially elevated at 90 with follow-ups of 31 and 25.  Patient denies chest pain or dyspnea.  We have been asked to evaluate for possible syncope.  Inpatient Medications    . atorvastatin  20 mg Oral Daily  . colchicine  0.6 mg Oral Daily  . folic acid  1 mg Oral Daily  . gabapentin  300 mg Oral BID  . multivitamin with minerals  1 tablet Oral Daily  . oxyCODONE  10 mg Oral Q6H  . sodium chloride flush  3 mL Intravenous Q12H  . thiamine  100 mg Oral Daily   Or  . thiamine  100 mg Intravenous Daily    Family History    Family History  Problem Relation Age of Onset  . Heart failure Mother        died w/ "enlarged heart" in late 68's  . Cancer Father   .  COPD Sister   . Heart attack Brother        died in his late 77's  . COPD Sister   . COPD Sister    He indicated that his mother is deceased. He indicated that his father is deceased. He indicated that all of his three sisters are deceased. He indicated that his brother is deceased.   Social History    Social History   Socioeconomic History  . Marital status: Single    Spouse name: Not on file  . Number of children: Not on file  . Years of education: Not on file  . Highest education level: Not on file  Occupational History  . Not on file  Tobacco Use  . Smoking status: Current Every Day Smoker    Packs/day: 1.50    Types: Cigarettes  . Smokeless tobacco: Never Used  Vaping Use  . Vaping Use: Never used  Substance  and Sexual Activity  . Alcohol use: Yes    Alcohol/week: 1.0 - 2.0 standard drink    Types: 1 - 2 Cans of beer per week    Comment: 1-2 beer daily  . Drug use: Never  . Sexual activity: Not on file  Other Topics Concern  . Not on file  Social History Narrative   Lives locally w/ a friend.  Does not routinely exercise.   Social Determinants of Health   Financial Resource Strain:   . Difficulty of Paying Living Expenses:   Food Insecurity:   . Worried About Programme researcher, broadcasting/film/video in the Last Year:   . Barista in the Last Year:   Transportation Needs:   . Freight forwarder (Medical):   Marland Kitchen Lack of Transportation (Non-Medical):   Physical Activity:   . Days of Exercise per Week:   . Minutes of Exercise per Session:   Stress:   . Feeling of Stress :   Social Connections:   . Frequency of Communication with Friends and Family:   . Frequency of Social Gatherings with Friends and Family:   . Attends Religious Services:   . Active Member of Clubs or Organizations:   . Attends Banker Meetings:   Marland Kitchen Marital Status:   Intimate Partner Violence:   . Fear of Current or Ex-Partner:   . Emotionally Abused:   Marland Kitchen Physically Abused:   . Sexually Abused:      Review of Systems    General:  No chills, fever, night sweats or weight changes.  Cardiovascular:  No chest pain, +++ some degree of chronic dyspnea on exertion, no edema, orthopnea, palpitations, paroxysmal nocturnal dyspnea. Dermatological: No rash, lesions/masses Respiratory: No cough, +++ some degree of chronic dyspnea Urologic: No hematuria, dysuria Abdominal:   No nausea, vomiting, diarrhea, bright red blood per rectum, melena, or hematemesis Neurologic:  No visual changes, wkns.  As noted above, loss of consciousness following head strike in the setting of fall. Musculoskeletal: Reports bilateral shoulder posterior scalp pain. All other systems reviewed and are otherwise negative except as noted  above.  Physical Exam    Blood pressure 137/85, pulse 79, temperature (!) 97.4 F (36.3 C), temperature source Oral, resp. rate 18, height 5\' 10"  (1.778 m), weight 75.8 kg, SpO2 93 %.  General: Pleasant, NAD Psych: Normal affect. Neuro: Alert and oriented X 3. Moves all extremities spontaneously. HEENT: Normal  Neck: Supple without bruits or JVD. Lungs:  Resp regular and unlabored, diminished breath sounds with faint inspiratory/expiratory wheezing. Heart: RRR no  s3, s4, or murmurs. Abdomen: Soft, non-tender, non-distended, BS + x 4.  Extremities: No clubbing, cyanosis or edema. DP/PT/Radials 2+ and equal bilaterally.  Labs    Cardiac Enzymes Recent Labs  Lab 06/08/20 0344 06/08/20 0602  TROPONINIHS 31* 25*      Lab Results  Component Value Date   WBC 9.2 06/08/2020   HGB 17.0 06/08/2020   HCT 47.9 06/08/2020   MCV 96.0 06/08/2020   PLT 176 06/08/2020    Recent Labs  Lab 06/08/20 0921  NA 137  K 3.5  CL 101  CO2 28  BUN 7*  CREATININE 0.81  CALCIUM 8.7*  PROT 5.9*  BILITOT 0.9  ALKPHOS 60  ALT 17  AST 19  GLUCOSE 127*   Radiology Studies    CT Head Wo Contrast  Result Date: 06/08/2020 CLINICAL DATA:  [place] backwards with posterior laceration EXAM: CT HEAD WITHOUT CONTRAST CT CERVICAL SPINE WITHOUT CONTRAST TECHNIQUE: Multidetector CT imaging of the head and cervical spine was performed following the standard protocol without intravenous contrast. Multiplanar CT image reconstructions of the cervical spine were also generated. COMPARISON:  CT head and cervical spine 05/07/2020 FINDINGS: CT HEAD FINDINGS Brain: No evidence of acute infarction, hemorrhage, hydrocephalus, extra-axial collection or mass lesion/mass effect. Symmetric prominence of the ventricles, cisterns and sulci compatible with parenchymal volume loss. Patchy areas of white matter hypoattenuation are most compatible with chronic microvascular angiopathy. Vascular: Atherosclerotic calcification of the  carotid siphons. No hyperdense vessel. Skull: High left parietal scalp laceration and contusive change with trace crescentic hematoma measuring up to 3 mm in maximal thickness (axial image 2/23). No subjacent calvarial fracture, sutural diastasis or other acute traumatic osseous abnormality. No worrisome osseous lesions. Plate screw fixation of the superolateral right orbit without acute complication. Sinuses/Orbits: Paranasal sinuses and mastoid air cells are predominantly clear. Pneumatization of the petrous apices. Included orbital structures are unremarkable. Other: None. CT CERVICAL SPINE FINDINGS Alignment: Stabilization collar in place. Straightening of the normal cervical lordosis possibly related to this device. Mild anterolisthesis at C2-3 and C4-5 is unchanged from comparison sin favored to be on a degenerative basis. Minimal left lateral neck flexion. No evidence of traumatic listhesis. Bony fusion of the left C2-3 facet. Some asymmetric degenerative changes at the left C4-5 facet resulting in some mild widening. No other abnormally widened, perched or jumped facets. Normal alignment of the craniocervical and atlantoaxial articulations. Skull base and vertebrae: No acute vertebral body fracture or height loss. No visible skull base fractures. Mild sclerosis in the dens, likely degenerative and unchanged from prior. Stable sclerotic focus in the T2 spinous process. Bony fusion the left C2-3 facets, as above. Additional multilevel spondylitic changes and facet arthropathy, further detailed below. Soft tissues and spinal canal: No pre or paravertebral fluid or swelling. No visible canal hematoma. Enthesopathic mineralization in the nuchal ligament. Disc levels: Multilevel intervertebral disc height loss with spondylitic endplate changes. Posterior disc osteophyte complexes efface the ventral thecal sac most pronounced at C5-6 but without significant canal stenosis. Uncinate spurring and facet hypertrophic  changes are present throughout the cervical spine with mild multilevel neural foraminal narrowing. More moderate to severe foraminal stenoses present at C 5-6, C6-7 bilaterally. Upper chest: Apical pleuroparenchymal scarring and emphysematous changes. Cervical carotid atherosclerosis. No acute abnormality in the upper chest or imaged lung apices. Small amount of gas in the right jugular vein, likely related to intravenous access. Other: No worrisome thyroid nodules. IMPRESSION: 1. High left parietal scalp laceration and contusive change with trace  crescentic hematoma measuring up to 3 mm in maximal thickness. No subjacent calvarial fracture, sutural diastasis or other acute traumatic osseous abnormality. 2. No acute intracranial abnormality. Background of chronic mild parenchymal volume loss and microvascular angiopathy. 3. No evidence of acute fracture or traumatic listhesis of the cervical spine. 4. Bony fusion of the left C2-3 facet. 5. 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 No significant canal stenosis. Moderate to severe bilateral foraminal narrowing C5-C7. 6. Cervical and intracranial atherosclerosis. 7. Emphysema (ICD10-J43.9). Electronically Signed   By: Kreg Shropshire M.D.   On: 06/08/2020 04:59   CT Cervical Spine Wo Contrast  Result Date: 06/08/2020 CLINICAL DATA:  [place] backwards with posterior laceration EXAM: CT HEAD WITHOUT CONTRAST CT CERVICAL SPINE WITHOUT CONTRAST TECHNIQUE: Multidetector CT imaging of the head and cervical spine was performed following the standard protocol without intravenous contrast. Multiplanar CT image reconstructions of the cervical spine were also generated. COMPARISON:  CT head and cervical spine 05/07/2020 FINDINGS: CT HEAD FINDINGS Brain: No evidence of acute infarction, hemorrhage, hydrocephalus, extra-axial collection or mass lesion/mass effect. Symmetric prominence of the ventricles, cisterns and sulci compatible with  parenchymal volume loss. Patchy areas of white matter hypoattenuation are most compatible with chronic microvascular angiopathy. Vascular: Atherosclerotic calcification of the carotid siphons. No hyperdense vessel. Skull: High left parietal scalp laceration and contusive change with trace crescentic hematoma measuring up to 3 mm in maximal thickness (axial image 2/23). No subjacent calvarial fracture, sutural diastasis or other acute traumatic osseous abnormality. No worrisome osseous lesions. Plate screw fixation of the superolateral right orbit without acute complication. Sinuses/Orbits: Paranasal sinuses and mastoid air cells are predominantly clear. Pneumatization of the petrous apices. Included orbital structures are unremarkable. Other: None. CT CERVICAL SPINE FINDINGS Alignment: Stabilization collar in place. Straightening of the normal cervical lordosis possibly related to this device. Mild anterolisthesis at C2-3 and C4-5 is unchanged from comparison sin favored to be on a degenerative basis. Minimal left lateral neck flexion. No evidence of traumatic listhesis. Bony fusion of the left C2-3 facet. Some asymmetric degenerative changes at the left C4-5 facet resulting in some mild widening. No other abnormally widened, perched or jumped facets. Normal alignment of the craniocervical and atlantoaxial articulations. Skull base and vertebrae: No acute vertebral body fracture or height loss. No visible skull base fractures. Mild sclerosis in the dens, likely degenerative and unchanged from prior. Stable sclerotic focus in the T2 spinous process. Bony fusion the left C2-3 facets, as above. Additional multilevel spondylitic changes and facet arthropathy, further detailed below. Soft tissues and spinal canal: No pre or paravertebral fluid or swelling. No visible canal hematoma. Enthesopathic mineralization in the nuchal ligament. Disc levels: Multilevel intervertebral disc height loss with spondylitic endplate  changes. Posterior disc osteophyte complexes efface the ventral thecal sac most pronounced at C5-6 but without significant canal stenosis. Uncinate spurring and facet hypertrophic changes are present throughout the cervical spine with mild multilevel neural foraminal narrowing. More moderate to severe foraminal stenoses present at C 5-6, C6-7 bilaterally. Upper chest: Apical pleuroparenchymal scarring and emphysematous changes. Cervical carotid atherosclerosis. No acute abnormality in the upper chest or imaged lung apices. Small amount of gas in the right jugular vein, likely related to intravenous access. Other: No worrisome thyroid nodules. IMPRESSION: 1. High left parietal scalp laceration and contusive change with trace crescentic hematoma measuring up to 3 mm in maximal thickness. No subjacent calvarial fracture, sutural diastasis or other acute traumatic osseous abnormality. 2. No acute intracranial abnormality.  Background of chronic mild parenchymal volume loss and microvascular angiopathy. 3. No evidence of acute fracture or traumatic listhesis of the cervical spine. 4. Bony fusion of the left C2-3 facet. 5. 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 No significant canal stenosis. Moderate to severe bilateral foraminal narrowing C5-C7. 6. Cervical and intracranial atherosclerosis. 7. Emphysema (ICD10-J43.9). Electronically Signed   By: Kreg Shropshire M.D.   On: 06/08/2020 04:59    ECG & Cardiac Imaging    Regular sinus rhythm, 81, left atrial enlargement, nonspecific T changes, prolonged QT-449, QTc 580 - personally reviewed.  Assessment & Plan    1.  Loss of consciousness/fall: Patient reports he was walking in his home earlier this morning when his left knee locked up as his prior history of this in the setting of degenerative joint disease status post bilateral knee replacements), he lost his footing and then fell backwards.  He remembers falling and  also remembers striking his head on the floor.  He was at that point and lost consciousness he was being attended to by EMS.  Unsure as to how long he was without consciousness.  Prior to his fall.  Based on his description, it would appear that his loss of consciousness is secondary to fall and most likely concussion.  Head CT notable for left parietal scalp but otherwise without acute injury.  Reasonable to continue monitoring and follow-up echocardiogram in the setting of prolonged QT, though metabolic abnormalities likely contributing.  2.  Prolonged QT: QT 499 QTC of 580 in the setting of  mild hypokalemia (3.5), hypomagnesemia (1.7), and hypocalcemia (8.7).  Potassium supplemented.  Will order 2 g of magnesium sulfate.  Given patient's description of what occurred with fall and loss of consciousness, arrhythmia less likely to be a contributor.  We will follow-up echo.  3.  Essential hypertension: Currently stable.  Patient uses lisinopril therapy at home.  4.  Tobacco abuse/COPD: Will need cessation counseling.  Diminished breath sounds with wheezing on exam and would likely benefit from inhaler therapy.  Defer to internal medicine.  5.  Alcohol use: Patient says he does not drink heavily and only had 1 drink last night around 9 PM.  Ethyl alcohol level markedly elevated at 114.  CIWA protocol ordered.  6.  Elevated high-sensitivity troponin: Patient denies chest pain or dyspnea.  Initial value was 90 with subsequent values of 31 and then 25.  ECG with nonspecific T changes in the setting of electrolyte abnormalities and prolonged QT.  Certainly has risk factors for coronary disease including family history, hypertension, tobacco abuse, though current presentation not consistent with ACS.  Further recommendations pending echocardiogram as he may benefit from outpatient ischemic testing.   Signed, Nicolasa Ducking, NP 06/08/2020, 11:44 AM  For questions or updates, please contact   Please  consult [email or web address] for contact info under Cardiology/STEMI.

## 2020-06-08 NOTE — ED Provider Notes (Signed)
[facility] Emergency Department Provider Note  ____________________________________________   First MD Initiated Contact with Patient 06/08/20 [phone]     (approximate)  I have reviewed the triage vital signs and the nursing notes.   HISTORY  Chief Complaint Loss of Consciousness    HPI Kurt Blankenship is a 64 y.o. male with below list of previous medical conditions presents to the emergency department via EMS following fall.  Patient states that he got up to use the bathroom when his "knee gave out" causing him to fall with resultant head injury.  Patient admits to loss of consciousness.  Patient dates current pain score is 9 out of 10.  Patient denied any preceding chest pain or shortness of breath.  Patient denies any preceding headache weakness numbness.  Patient denies any of the aforementioned symptoms at present.  Patient does admit to EtOH ingestion tonight and taking his trazodone which he states is not unusual for him to do.  EMS states that the patient had a syncopal episode that they witnessed upon their arrival.        Past Medical History:  Diagnosis Date  . COPD (chronic obstructive pulmonary disease) (HCC)   . Hypertension     Patient Active Problem List   Diagnosis Date Noted  . Acute exacerbation of chronic obstructive pulmonary disease (COPD) (HCC) 05/15/2016    History reviewed. No pertinent surgical history.  Prior to Admission medications   Medication Sig Start Date End Date Taking? Authorizing Provider  albuterol (PROVENTIL HFA;VENTOLIN HFA) 108 (90 Base) MCG/ACT inhaler Inhale 2 puffs into the lungs every 4 (four) hours as needed for wheezing or shortness of breath.    [provider]  albuterol (PROVENTIL) (2.5 MG/3ML) 0.083% nebulizer solution Take 3 mLs (2.5 mg total) by nebulization every 4 (four) hours as needed for wheezing or shortness of breath. 05/16/16   Enedina Finner, MD  budesonide-formoterol [place] [place]) 160-4.5  MCG/ACT inhaler Inhale 2 puffs into the lungs 2 (two) times daily.    [provider]  chlorthalidone (HYGROTON) 25 MG tablet Take 25 mg by mouth daily.    [provider]  doxycycline (VIBRA-TABS) 100 MG tablet Take 1 tablet (100 mg total) by mouth every 12 (twelve) hours. 05/16/16   Enedina Finner, MD  Oxycodone HCl 10 MG TABS Take 10 mg by mouth every 6 (six) hours.    [provider]  predniSONE (DELTASONE) 10 MG tablet Take 50 mg daily and taper by 10 mg daily then stop 05/17/16   Enedina Finner, MD  predniSONE (STERAPRED UNI-PAK 21 TAB) 10 MG (21) TBPK tablet Per packaging instructions 02/25/19   Phineas Semen, MD    Allergies Patient has no known allergies.  No family history on file.  Social History Social History   Tobacco Use  . Smoking status: Current Every Day Smoker    Packs/day: 1.50    Types: Cigarettes  . Smokeless tobacco: Never Used  Vaping Use  . Vaping Use: Never used  Substance Use Topics  . Alcohol use: Yes    Alcohol/week: 1.0 - 2.0 standard drink    Types: 1 - 2 Cans of beer per week    Comment: 1-2 beer daily  . Drug use: Never    Review of Systems Constitutional: No fever/chills Eyes: No visual changes. ENT: No sore throat. Cardiovascular: Denies chest pain. Respiratory: Denies shortness of breath. Gastrointestinal: No abdominal pain.  No nausea, no vomiting.  No diarrhea.  No constipation. Genitourinary: Negative for  dysuria. Musculoskeletal: Negative for neck pain.  Negative for back pain. Integumentary: Positive for scalp laceration Neurological: Negative for headaches, focal weakness or numbness.   ____________________________________________   PHYSICAL EXAM:  VITAL SIGNS: ED Triage Vitals [06/08/20 0348]  Enc Vitals Group     BP 107/64     Pulse Rate 79     Resp (!) 23     Temp (!) 97.4 F (36.3 C)     Temp Source Oral     SpO2 (!) 88 %     Weight 75.8 kg (167 lb 1.7 oz)     Height 1.778 m (5\' 10" )      Head Circumference      Peak Flow      Pain Score 9     Pain Loc      Pain Edu?      Excl. in GC?     Constitutional: Alert and oriented.  Eyes: Conjunctivae are normal.  Head: Atraumatic. Mouth/Throat: Patient is wearing a mask. Neck: No stridor.  No meningeal signs.   Cardiovascular: Normal rate, regular rhythm. Good peripheral circulation. Grossly normal heart sounds. Respiratory: Normal respiratory effort.  No retractions. Gastrointestinal: Soft and nontender. No distention.  Musculoskeletal: No lower extremity tenderness nor edema. No gross deformities of extremities. Neurologic:  Normal speech and language. No gross focal neurologic deficits are appreciated.  Skin: 9 cm linear occipital scalp laceration Psychiatric: Mood and affect are normal. Speech and behavior are normal.  ____________________________________________   LABS (all labs ordered are listed, but only abnormal results are displayed)  Labs Reviewed  COMPREHENSIVE METABOLIC PANEL - Abnormal; Notable for the following components:      Result Value   Potassium 3.4 (*)    Glucose, Bld 126 (*)    BUN 6 (*)    Calcium 8.7 (*)    Total Protein 5.7 (*)    Albumin 3.3 (*)    All other components within normal limits  ETHANOL - Abnormal; Notable for the following components:   Alcohol, Ethyl (B) 114 (*)    All other components within normal limits  TROPONIN I (HIGH SENSITIVITY) - Abnormal; Notable for the following components:   Troponin I (High Sensitivity) 31 (*)    All other components within normal limits  CBC  TROPONIN I (HIGH SENSITIVITY)   ____________________________________________  EKG   ED ECG REPORT I, [place] Yennifer Segovia, the attending physician, personally viewed and interpreted this ECG.   Date: 06/08/2020  EKG Time: 3:41 AM  Rate: 81  Rhythm: Normal sinus rhythm  Axis: Normal  Intervals: QTc 580  ST&T Change: None   ED ECG REPORT I, [place] Alvia Jablonski, the attending physician,  personally viewed and interpreted this ECG.   Date: 06/08/2020  EKG Time: 5:50 AM  Rate: 81  Rhythm: Normal sinus rhythm  Axis: Normal  Intervals: Prolonged QTc 509  ST&T Change: None    RADIOLOGY I, [place] Taym Twist, personally viewed and evaluated these images (plain radiographs) as part of my medical decision making, as well as reviewing the written report by the radiologist.  ED MD interpretation: High left parietal scalp laceration contusion noted with crescentic hematoma measuring up to 3 mm in maximum thickness without any underlying skull fracture.  No acute fracture or listhesis of the cervical spine noted.  Official radiology report(s): CT Head Wo Contrast  Result Date: 06/08/2020 CLINICAL DATA:  06/10/2020 backwards with posterior laceration EXAM: CT HEAD WITHOUT CONTRAST CT CERVICAL SPINE WITHOUT CONTRAST TECHNIQUE: Multidetector CT imaging  of the head and cervical spine was performed following the standard protocol without intravenous contrast. Multiplanar CT image reconstructions of the cervical spine were also generated. COMPARISON:  CT head and cervical spine 05/07/2020 FINDINGS: CT HEAD FINDINGS Brain: No evidence of acute infarction, hemorrhage, hydrocephalus, extra-axial collection or mass lesion/mass effect. Symmetric prominence of the ventricles, cisterns and sulci compatible with parenchymal volume loss. Patchy areas of white matter hypoattenuation are most compatible with chronic microvascular angiopathy. Vascular: Atherosclerotic calcification of the carotid siphons. No hyperdense vessel. Skull: High left parietal scalp laceration and contusive change with trace crescentic hematoma measuring up to 3 mm in maximal thickness (axial image 2/23). No subjacent calvarial fracture, sutural diastasis or other acute traumatic osseous abnormality. No worrisome osseous lesions. Plate screw fixation of the superolateral right orbit without acute complication. Sinuses/Orbits: Paranasal  sinuses and mastoid air cells are predominantly clear. Pneumatization of the petrous apices. Included orbital structures are unremarkable. Other: None. CT CERVICAL SPINE FINDINGS Alignment: Stabilization collar in place. Straightening of the normal cervical lordosis possibly related to this device. Mild anterolisthesis at C2-3 and C4-5 is unchanged from comparison sin favored to be on a degenerative basis. Minimal left lateral neck flexion. No evidence of traumatic listhesis. Bony fusion of the left C2-3 facet. Some asymmetric degenerative changes at the left C4-5 facet resulting in some mild widening. No other abnormally widened, perched or jumped facets. Normal alignment of the craniocervical and atlantoaxial articulations. Skull base and vertebrae: No acute vertebral body fracture or height loss. No visible skull base fractures. Mild sclerosis in the dens, likely degenerative and unchanged from prior. Stable sclerotic focus in the T2 spinous process. Bony fusion the left C2-3 facets, as above. Additional multilevel spondylitic changes and facet arthropathy, further detailed below. Soft tissues and spinal canal: No pre or paravertebral fluid or swelling. No visible canal hematoma. Enthesopathic mineralization in the nuchal ligament. Disc levels: Multilevel intervertebral disc height loss with spondylitic endplate changes. Posterior disc osteophyte complexes efface the ventral thecal sac most pronounced at C5-6 but without significant canal stenosis. Uncinate spurring and facet hypertrophic changes are present throughout the cervical spine with mild multilevel neural foraminal narrowing. More moderate to severe foraminal stenoses present at C 5-6, C6-7 bilaterally. Upper chest: Apical pleuroparenchymal scarring and emphysematous changes. Cervical carotid atherosclerosis. No acute abnormality in the upper chest or imaged lung apices. Small amount of gas in the right jugular vein, likely related to intravenous  access. Other: No worrisome thyroid nodules. IMPRESSION: 1. High left parietal scalp laceration and contusive change with trace crescentic hematoma measuring up to 3 mm in maximal thickness. No subjacent calvarial fracture, sutural diastasis or other acute traumatic osseous abnormality. 2. No acute intracranial abnormality. Background of chronic mild parenchymal volume loss and microvascular angiopathy. 3. No evidence of acute fracture or traumatic listhesis of the cervical spine. 4. Bony fusion of the left C2-3 facet. 5. 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 No significant canal stenosis. Moderate to severe bilateral foraminal narrowing C5-C7. 6. Cervical and intracranial atherosclerosis. 7. Emphysema (ICD10-J43.9). Electronically Signed   By: Kreg Shropshire M.D.   On: 06/08/2020 04:59   CT Cervical Spine Wo Contrast  Result Date: 06/08/2020 CLINICAL DATA:  [place] backwards with posterior laceration EXAM: CT HEAD WITHOUT CONTRAST CT CERVICAL SPINE WITHOUT CONTRAST TECHNIQUE: Multidetector CT imaging of the head and cervical spine was performed following the standard protocol without intravenous contrast. Multiplanar CT image reconstructions of the cervical spine were also generated. COMPARISON:  CT head and cervical spine 05/07/2020 FINDINGS: CT HEAD FINDINGS Brain: No evidence of acute infarction, hemorrhage, hydrocephalus, extra-axial collection or mass lesion/mass effect. Symmetric prominence of the ventricles, cisterns and sulci compatible with parenchymal volume loss. Patchy areas of white matter hypoattenuation are most compatible with chronic microvascular angiopathy. Vascular: Atherosclerotic calcification of the carotid siphons. No hyperdense vessel. Skull: High left parietal scalp laceration and contusive change with trace crescentic hematoma measuring up to 3 mm in maximal thickness (axial image 2/23). No subjacent calvarial fracture, sutural diastasis or  other acute traumatic osseous abnormality. No worrisome osseous lesions. Plate screw fixation of the superolateral right orbit without acute complication. Sinuses/Orbits: Paranasal sinuses and mastoid air cells are predominantly clear. Pneumatization of the petrous apices. Included orbital structures are unremarkable. Other: None. CT CERVICAL SPINE FINDINGS Alignment: Stabilization collar in place. Straightening of the normal cervical lordosis possibly related to this device. Mild anterolisthesis at C2-3 and C4-5 is unchanged from comparison sin favored to be on a degenerative basis. Minimal left lateral neck flexion. No evidence of traumatic listhesis. Bony fusion of the left C2-3 facet. Some asymmetric degenerative changes at the left C4-5 facet resulting in some mild widening. No other abnormally widened, perched or jumped facets. Normal alignment of the craniocervical and atlantoaxial articulations. Skull base and vertebrae: No acute vertebral body fracture or height loss. No visible skull base fractures. Mild sclerosis in the dens, likely degenerative and unchanged from prior. Stable sclerotic focus in the T2 spinous process. Bony fusion the left C2-3 facets, as above. Additional multilevel spondylitic changes and facet arthropathy, further detailed below. Soft tissues and spinal canal: No pre or paravertebral fluid or swelling. No visible canal hematoma. Enthesopathic mineralization in the nuchal ligament. Disc levels: Multilevel intervertebral disc height loss with spondylitic endplate changes. Posterior disc osteophyte complexes efface the ventral thecal sac most pronounced at C5-6 but without significant canal stenosis. Uncinate spurring and facet hypertrophic changes are present throughout the cervical spine with mild multilevel neural foraminal narrowing. More moderate to severe foraminal stenoses present at C 5-6, C6-7 bilaterally. Upper chest: Apical pleuroparenchymal scarring and emphysematous changes.  Cervical carotid atherosclerosis. No acute abnormality in the upper chest or imaged lung apices. Small amount of gas in the right jugular vein, likely related to intravenous access. Other: No worrisome thyroid nodules. IMPRESSION: 1. High left parietal scalp laceration and contusive change with trace crescentic hematoma measuring up to 3 mm in maximal thickness. No subjacent calvarial fracture, sutural diastasis or other acute traumatic osseous abnormality. 2. No acute intracranial abnormality. Background of chronic mild parenchymal volume loss and microvascular angiopathy. 3. No evidence of acute fracture or traumatic listhesis of the cervical spine. 4. Bony fusion of the left C2-3 facet. 5. 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 No significant canal stenosis. Moderate to severe bilateral foraminal narrowing C5-C7. 6. Cervical and intracranial atherosclerosis. 7. Emphysema (ICD10-J43.9). Electronically Signed   By: Lovena Le M.D.   On: 06/08/2020 04:59    ____________________________________________   PROCEDURES    .Marland KitchenLaceration Repair  Date/Time: 06/08/2020 6:33 AM Performed by: Gregor Hams, MD Authorized by: Gregor Hams, MD   Consent:    Consent obtained:  Verbal   Consent given by:  Patient   Risks discussed:  Infection, pain, retained foreign body, poor cosmetic result and poor wound healing Anesthesia (see MAR for exact dosages):    Anesthesia method:  Local infiltration   Local anesthetic:  Lidocaine 1% WITH epi Laceration details:  Location:  Scalp   Scalp location:  R parietal   Length (cm):  9 Repair type:    Repair type:  Simple Exploration:    Hemostasis achieved with:  Direct pressure   Wound exploration: entire depth of wound probed and visualized     Contaminated: no   Treatment:    Area cleansed with:  Saline   Amount of cleaning:  Extensive   Irrigation solution:  Sterile saline   Visualized foreign  bodies/material removed: no   Skin repair:    Repair method:  Staples   Number of staples:  12 Approximation:    Approximation:  Close Post-procedure details:    Dressing:  Sterile dressing   Patient tolerance of procedure:  Tolerated well, no immediate complications     ____________________________________________   INITIAL IMPRESSION / MDM / ASSESSMENT AND PLAN / ED COURSE  As part of my medical decision making, I reviewed the following data within the electronic MEDICAL RECORD NUMBER    64 year old male presented with above-stated history and physical exam following reported accidental fall with resultant scalp laceration.  Wound was repaired as stated above.  Laboratory data revealed a troponin of 31.  EKG did reveal a prolonged QTC of 580 comparison EKG from 05/07/2020 which also revealed a prolonged QTC of 528.  Given this finding in the setting of syncope will admit the patient for further evaluation and management.  ____________________________________________  FINAL CLINICAL IMPRESSION(S) / ED DIAGNOSES  Final diagnoses:  Scalp laceration, initial encounter  Prolonged Q-T interval on ECG  Syncope, unspecified syncope type     MEDICATIONS GIVEN DURING THIS VISIT:  Medications  lidocaine-EPINEPHrine (XYLOCAINE W/EPI) 2 %-1:100000 (with pres) injection (1 mL  Given 06/08/20 0501)  oxyCODONE-acetaminophen (PERCOCET/ROXICET) 5-325 MG per tablet 1 tablet (1 tablet Oral Given 06/08/20 0503)     ED Discharge Orders    None      *Please note:  Kurt Blankenship was evaluated in Emergency Department on 06/08/2020 for the symptoms described in the history of present illness. He was evaluated in the context of the global COVID-19 pandemic, which necessitated consideration that the patient might be at risk for infection with the SARS-CoV-2 virus that causes COVID-19. Institutional protocols and algorithms that pertain to the evaluation of patients at risk for COVID-19 are in a state of  rapid change based on information released by regulatory bodies including the CDC and federal and state organizations. These policies and algorithms were followed during the patient's care in the ED.  Some ED evaluations and interventions may be delayed as a result of limited staffing during and after the pandemic.*  Note:  This document was prepared using Dragon voice recognition software and may include unintentional dictation errors.   Darci Current, MD 06/08/20 [phone]

## 2020-06-08 NOTE — ED Triage Notes (Addendum)
Pt arrives [facility] from home w cc of syncopal episode. Pt was found on ground, fell backwards and has 2" lac to back of head. Bleeding controlled and c-collar in place. EMS reports pt had syncopal episode while getting onto stretcher. BP 86/62, given NS through 18G R AC and BP then 88/60. Pt placed on 2L [place] sats 96%  Pt family reports he was drinking tonight and took his 100mg  trazodone for bed. Family states this is normal behavior for him and has never caused this reaction before.

## 2020-06-08 NOTE — ED Notes (Signed)
Pt sleeping  Iv in place.  Pt waiting on admission.

## 2020-06-08 NOTE — ED Notes (Signed)
Pt sleeping. 

## 2020-06-08 NOTE — ED Notes (Signed)
Pt up to bathroom with assistance 

## 2020-06-08 NOTE — ED Notes (Signed)
This RN and Lequita Halt, Environmental health practitioner to bedside. Pt alert and oriented at this time. Pt resting in bed watching TV. Pt's RA sats noted to be 88% on RA, pt placed on 2L via [place] at this time. Pt's call bell within reach. Pt denies further needs. Pt updated on plan of care regarding admission.

## 2020-06-08 NOTE — Progress Notes (Signed)
*  PRELIMINARY RESULTS* Echocardiogram 2D Echocardiogram has been performed.  Cristela Blue 06/08/2020, 10:44 AM

## 2020-06-08 NOTE — ED Notes (Signed)
Pt ate 85% of his lunch meal.

## 2020-06-08 NOTE — ED Notes (Signed)
Message sent to pharmacy regarding verifying patient's admission medications.

## 2020-06-08 NOTE — ED Notes (Signed)
Pt repositioned in bed at this time, NAD noted, pt ate 100% of meal tray. Lights dimmed for patient comfort. Pt denies further needs. Call bell within reach at this time.

## 2020-06-08 NOTE — ED Notes (Signed)
Cardiology at bedside at this time

## 2020-06-08 NOTE — ED Notes (Signed)
Pt transported to CT scan.

## 2020-06-08 NOTE — ED Notes (Signed)
Admitting MD at bedside at this time.

## 2020-06-08 NOTE — ED Notes (Signed)
Report called to jackie rn floor nurse 

## 2020-06-08 NOTE — H&P (Addendum)
History and Physical    Kurt Blankenship KZS:[id number] DOB: September 08, 1956 DOA: 06/08/2020  PCP: Marshell Garfinkel, MD   Patient coming from: Home  I have personally briefly reviewed patient's old medical records in [facility]  Chief Complaint: " I passed out"  HPI: Kurt Blankenship with medical history significant for hypertension and COPD.  He was brought into the emergency room after he had a syncopal episode at home.  Patient stated that he had gotten up to use the bathroom and his knees gave out resulting in a fall backwards.  Patient thinks he may have lost consciousness and states that his family heard him fall and found him on the floor unresponsive and called EMS.  Patient denies any preceding weakness, dizziness or lightheadedness.  He admits to alcohol ingestion which he does every night and had also taken his trazodone 100 mg. Patient was said to have a laceration to the back of his head and EMS reported a witnessed syncopal episode while patient was trying to get onto stretcher and at that time blood pressure was 88/62.  He received 500 cc bolus of normal saline in the field. Patient denies having any chest pain, nausea, vomiting, dizziness, lightheadedness, abdominal pain or any changes in his bowel habits.  He complains of neck pain which is chronic and a headache. Labs revealed mild hypokalemia with potassium of 3.4 and alcohol level of 114. Twelve-lead EKG revealed sinus rhythm with a prolonged QT Patient had a CT scan of the head done without contrast which showed high left parietal scalp laceration and contusive change with trace crescentic hematoma measuring up to 3 mm in maximal thickness. No subjacent calvarial fracture, sutural diastasis or other acute traumatic osseous abnormality. No evidence of acute fracture or traumatic listhesis of the cervical spine. Bony fusion of the left C2-3 facet. Multilevel spondylitic and facet arthropathy of the cervical spine, as  described above. No significant canal stenosis. Moderate to severe bilateral foraminal narrowing C5-C7.  ED Course: Patient is a 64 year old Caucasian Blankenship who was brought into the emergency room by EMS for evaluation of syncope.  Patient has a laceration on the high left parietal scalp area that was sutured in the ER.  He will be referred to observation status for further evaluation.  Review of Systems: As per HPI otherwise 10 point review of systems negative.    Past Medical History:  Diagnosis Date  . COPD (chronic obstructive pulmonary disease) (HCC)   . Hypertension     History reviewed. No pertinent surgical history.   reports that he has been smoking cigarettes. He has been smoking about 1.50 packs per day. He has never used smokeless tobacco. He reports current alcohol use of about 1.0 - 2.0 standard drink of alcohol per week. He reports that he does not use drugs.  No Known Allergies  No family history on file.   Prior to Admission medications   Medication Sig Start Date End Date Taking? Authorizing Provider  albuterol (PROVENTIL HFA;VENTOLIN HFA) 108 (90 Base) MCG/ACT inhaler Inhale 2 puffs into the lungs every 4 (four) hours as needed for wheezing or shortness of breath.   Yes [provider]  atorvastatin (LIPITOR) 20 MG tablet Take 1 tablet by mouth daily. 11/17/19 11/16/20 Yes [provider]  budesonide-formoterol (SYMBICORT) 160-4.5 MCG/ACT inhaler Inhale 2 puffs into the lungs 2 (two) times daily.   Yes [provider]  Colchicine (MITIGARE) 0.6 MG CAPS Take 1 capsule  by mouth daily as needed. 02/08/20  Yes [provider]  gabapentin (NEURONTIN) 300 MG capsule Take 1 capsule by mouth in the morning and at bedtime. 03/24/20 03/24/21 Yes [provider]  lisinopril (ZESTRIL) 20 MG tablet Take 1 tablet by mouth daily. 03/21/20  Yes [provider]  meclizine (ANTIVERT) 12.5 MG tablet Take 1 tablet by mouth 3 (three) times  daily as needed for dizziness. 11/17/19 11/16/20 Yes [provider]  Oxycodone HCl 10 MG TABS Take 10 mg by mouth every 6 (six) hours.   Yes [provider]  traZODone (DESYREL) 50 MG tablet Take 1 tablet by mouth at bedtime. 04/17/19  Yes [provider]  albuterol (PROVENTIL) (2.5 MG/3ML) 0.083% nebulizer solution Take 3 mLs (2.5 mg total) by nebulization every 4 (four) hours as needed for wheezing or shortness of breath. Patient not taking: Reported on 06/08/2020 05/16/16   Fritzi Mandes, MD  chlorthalidone (HYGROTON) 25 MG tablet Take 25 mg by mouth daily. Patient not taking: Reported on 06/08/2020    [provider]  doxycycline (VIBRA-TABS) 100 MG tablet Take 1 tablet (100 mg total) by mouth every 12 (twelve) hours. Patient not taking: Reported on 06/08/2020 05/16/16   Fritzi Mandes, MD  predniSONE (DELTASONE) 10 MG tablet Take 50 mg daily and taper by 10 mg daily then stop Patient not taking: Reported on 06/08/2020 05/17/16   Fritzi Mandes, MD  predniSONE (STERAPRED UNI-PAK 21 TAB) 10 MG (21) TBPK tablet Per packaging instructions Patient not taking: Reported on 06/08/2020 02/25/19   Nance Pear, MD    Physical Exam: Vitals:   06/08/20 0348 06/08/20 0400 06/08/20 0710 06/08/20 0730  BP: 107/64 101/65 128/71 113/88  Pulse: 79 74 82 84  Resp: (!) 23 15 20  (!) 22  Temp: (!) 97.4 F (36.3 C)     TempSrc: Oral     SpO2: (!) 88% 96% 93% 97%  Weight: 75.8 kg     Height: 5\' 10"  (1.778 m)        Vitals:   06/08/20 0348 06/08/20 0400 06/08/20 0710 06/08/20 0730  BP: 107/64 101/65 128/71 113/88  Pulse: 79 74 82 84  Resp: (!) 23 15 20  (!) 22  Temp: (!) 97.4 F (36.3 C)     TempSrc: Oral     SpO2: (!) 88% 96% 93% 97%  Weight: 75.8 kg     Height: 5\' 10"  (1.778 m)       Constitutional: NAD, alert and oriented x 3.  Appears comfortable and in no distress.  Laceration involving the left parietal area Eyes: PERRL, lids and conjunctivae normal ENMT: Mucous  membranes are moist.  Neck: normal, supple, no masses, no thyromegaly Respiratory: clear to auscultation bilaterally, no wheezing, no crackles. Normal respiratory effort. No accessory muscle use.  Cardiovascular: Regular rate and rhythm, no murmurs / rubs / gallops. No extremity edema. 2+ pedal pulses. No carotid bruits.  Abdomen: no tenderness, no masses palpated. No hepatosplenomegaly. Bowel sounds positive.  Musculoskeletal: no clubbing / cyanosis. No joint deformity upper and lower extremities.  Skin: no rashes, lesions, ulcers.  Neurologic: No gross focal neurologic deficit.  Able to move all extremities Psychiatric: Normal mood and affect.   Labs on Admission: I have personally reviewed following labs and imaging studies  CBC: Recent Labs  Lab 06/08/20 0344  WBC 8.0  HGB 16.7  HCT 48.1  MCV 98.0  PLT 578   Basic Metabolic Panel: Recent Labs  Lab 06/08/20 0344  NA 139  K  3.4*  CL 104  CO2 26  GLUCOSE 126*  BUN 6*  CREATININE 0.92  CALCIUM 8.7*   GFR: Estimated Creatinine Clearance: 86 mL/min (by C-G formula based on SCr of 0.92 mg/dL). Liver Function Tests: Recent Labs  Lab 06/08/20 0344  AST 21  ALT 15  ALKPHOS 53  BILITOT 0.8  PROT 5.7*  ALBUMIN 3.3*   No results for input(s): LIPASE, AMYLASE in the last 168 hours. No results for input(s): AMMONIA in the last 168 hours. Coagulation Profile: No results for input(s): INR, PROTIME in the last 168 hours. Cardiac Enzymes: No results for input(s): CKTOTAL, CKMB, CKMBINDEX, TROPONINI in the last 168 hours. BNP (last 3 results) No results for input(s): PROBNP in the last 8760 hours. HbA1C: No results for input(s): HGBA1C in the last 72 hours. CBG: No results for input(s): GLUCAP in the last 168 hours. Lipid Profile: No results for input(s): CHOL, HDL, LDLCALC, TRIG, CHOLHDL, LDLDIRECT in the last 72 hours. Thyroid Function Tests: No results for input(s): TSH, T4TOTAL, FREET4, T3FREE, THYROIDAB in the  last 72 hours. Anemia Panel: No results for input(s): VITAMINB12, FOLATE, FERRITIN, TIBC, IRON, RETICCTPCT in the last 72 hours. Urine analysis:    Component Value Date/Time   COLORURINE AMBER (A) 02/25/2019 1203   APPEARANCEUR CLEAR (A) 02/25/2019 1203   APPEARANCEUR Clear 11/09/2012 1801   LABSPEC 1.029 02/25/2019 1203   LABSPEC 1.005 11/09/2012 1801   PHURINE 6.0 02/25/2019 1203   GLUCOSEU NEGATIVE 02/25/2019 1203   GLUCOSEU Negative 11/09/2012 1801   HGBUR NEGATIVE 02/25/2019 1203   BILIRUBINUR NEGATIVE 02/25/2019 1203   BILIRUBINUR Negative 11/09/2012 1801   KETONESUR 20 (A) 02/25/2019 1203   PROTEINUR 100 (A) 02/25/2019 1203   NITRITE NEGATIVE 02/25/2019 1203   LEUKOCYTESUR NEGATIVE 02/25/2019 1203   LEUKOCYTESUR Negative 11/09/2012 1801    Radiological Exams on Admission: CT Head Wo Contrast  Result Date: 06/08/2020 CLINICAL DATA:  Larey Seat backwards with posterior laceration EXAM: CT HEAD WITHOUT CONTRAST CT CERVICAL SPINE WITHOUT CONTRAST TECHNIQUE: Multidetector CT imaging of the head and cervical spine was performed following the standard protocol without intravenous contrast. Multiplanar CT image reconstructions of the cervical spine were also generated. COMPARISON:  CT head and cervical spine 05/07/2020 FINDINGS: CT HEAD FINDINGS Brain: No evidence of acute infarction, hemorrhage, hydrocephalus, extra-axial collection or mass lesion/mass effect. Symmetric prominence of the ventricles, cisterns and sulci compatible with parenchymal volume loss. Patchy areas of white matter hypoattenuation are most compatible with chronic microvascular angiopathy. Vascular: Atherosclerotic calcification of the carotid siphons. No hyperdense vessel. Skull: High left parietal scalp laceration and contusive change with trace crescentic hematoma measuring up to 3 mm in maximal thickness (axial image 2/23). No subjacent calvarial fracture, sutural diastasis or other acute traumatic osseous abnormality.  No worrisome osseous lesions. Plate screw fixation of the superolateral right orbit without acute complication. Sinuses/Orbits: Paranasal sinuses and mastoid air cells are predominantly clear. Pneumatization of the petrous apices. Included orbital structures are unremarkable. Other: None. CT CERVICAL SPINE FINDINGS Alignment: Stabilization collar in place. Straightening of the normal cervical lordosis possibly related to this device. Mild anterolisthesis at C2-3 and C4-5 is unchanged from comparison sin favored to be on a degenerative basis. Minimal left lateral neck flexion. No evidence of traumatic listhesis. Bony fusion of the left C2-3 facet. Some asymmetric degenerative changes at the left C4-5 facet resulting in some mild widening. No other abnormally widened, perched or jumped facets. Normal alignment of the craniocervical and atlantoaxial articulations. Skull base and vertebrae: No acute  vertebral body fracture or height loss. No visible skull base fractures. Mild sclerosis in the dens, likely degenerative and unchanged from prior. Stable sclerotic focus in the T2 spinous process. Bony fusion the left C2-3 facets, as above. Additional multilevel spondylitic changes and facet arthropathy, further detailed below. Soft tissues and spinal canal: No pre or paravertebral fluid or swelling. No visible canal hematoma. Enthesopathic mineralization in the nuchal ligament. Disc levels: Multilevel intervertebral disc height loss with spondylitic endplate changes. Posterior disc osteophyte complexes efface the ventral thecal sac most pronounced at C5-6 but without significant canal stenosis. Uncinate spurring and facet hypertrophic changes are present throughout the cervical spine with mild multilevel neural foraminal narrowing. More moderate to severe foraminal stenoses present at C 5-6, C6-7 bilaterally. Upper chest: Apical pleuroparenchymal scarring and emphysematous changes. Cervical carotid atherosclerosis. No acute  abnormality in the upper chest or imaged lung apices. Small amount of gas in the right jugular vein, likely related to intravenous access. Other: No worrisome thyroid nodules. IMPRESSION: 1. High left parietal scalp laceration and contusive change with trace crescentic hematoma measuring up to 3 mm in maximal thickness. No subjacent calvarial fracture, sutural diastasis or other acute traumatic osseous abnormality. 2. No acute intracranial abnormality. Background of chronic mild parenchymal volume loss and microvascular angiopathy. 3. No evidence of acute fracture or traumatic listhesis of the cervical spine. 4. Bony fusion of the left C2-3 facet. 5. 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 No significant canal stenosis. Moderate to severe bilateral foraminal narrowing C5-C7. 6. Cervical and intracranial atherosclerosis. 7. Emphysema (ICD10-J43.9). Electronically Signed   By: Kreg Shropshire M.D.   On: 06/08/2020 04:59   CT Cervical Spine Wo Contrast  Result Date: 06/08/2020 CLINICAL DATA:  Larey Seat backwards with posterior laceration EXAM: CT HEAD WITHOUT CONTRAST CT CERVICAL SPINE WITHOUT CONTRAST TECHNIQUE: Multidetector CT imaging of the head and cervical spine was performed following the standard protocol without intravenous contrast. Multiplanar CT image reconstructions of the cervical spine were also generated. COMPARISON:  CT head and cervical spine 05/07/2020 FINDINGS: CT HEAD FINDINGS Brain: No evidence of acute infarction, hemorrhage, hydrocephalus, extra-axial collection or mass lesion/mass effect. Symmetric prominence of the ventricles, cisterns and sulci compatible with parenchymal volume loss. Patchy areas of white matter hypoattenuation are most compatible with chronic microvascular angiopathy. Vascular: Atherosclerotic calcification of the carotid siphons. No hyperdense vessel. Skull: High left parietal scalp laceration and contusive change with trace  crescentic hematoma measuring up to 3 mm in maximal thickness (axial image 2/23). No subjacent calvarial fracture, sutural diastasis or other acute traumatic osseous abnormality. No worrisome osseous lesions. Plate screw fixation of the superolateral right orbit without acute complication. Sinuses/Orbits: Paranasal sinuses and mastoid air cells are predominantly clear. Pneumatization of the petrous apices. Included orbital structures are unremarkable. Other: None. CT CERVICAL SPINE FINDINGS Alignment: Stabilization collar in place. Straightening of the normal cervical lordosis possibly related to this device. Mild anterolisthesis at C2-3 and C4-5 is unchanged from comparison sin favored to be on a degenerative basis. Minimal left lateral neck flexion. No evidence of traumatic listhesis. Bony fusion of the left C2-3 facet. Some asymmetric degenerative changes at the left C4-5 facet resulting in some mild widening. No other abnormally widened, perched or jumped facets. Normal alignment of the craniocervical and atlantoaxial articulations. Skull base and vertebrae: No acute vertebral body fracture or height loss. No visible skull base fractures. Mild sclerosis in the dens, likely degenerative and unchanged from prior. Stable sclerotic focus in the  T2 spinous process. Bony fusion the left C2-3 facets, as above. Additional multilevel spondylitic changes and facet arthropathy, further detailed below. Soft tissues and spinal canal: No pre or paravertebral fluid or swelling. No visible canal hematoma. Enthesopathic mineralization in the nuchal ligament. Disc levels: Multilevel intervertebral disc height loss with spondylitic endplate changes. Posterior disc osteophyte complexes efface the ventral thecal sac most pronounced at C5-6 but without significant canal stenosis. Uncinate spurring and facet hypertrophic changes are present throughout the cervical spine with mild multilevel neural foraminal narrowing. More moderate to  severe foraminal stenoses present at C 5-6, C6-7 bilaterally. Upper chest: Apical pleuroparenchymal scarring and emphysematous changes. Cervical carotid atherosclerosis. No acute abnormality in the upper chest or imaged lung apices. Small amount of gas in the right jugular vein, likely related to intravenous access. Other: No worrisome thyroid nodules. IMPRESSION: 1. High left parietal scalp laceration and contusive change with trace crescentic hematoma measuring up to 3 mm in maximal thickness. No subjacent calvarial fracture, sutural diastasis or other acute traumatic osseous abnormality. 2. No acute intracranial abnormality. Background of chronic mild parenchymal volume loss and microvascular angiopathy. 3. No evidence of acute fracture or traumatic listhesis of the cervical spine. 4. Bony fusion of the left C2-3 facet. 5. 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 No significant canal stenosis. Moderate to severe bilateral foraminal narrowing C5-C7. 6. Cervical and intracranial atherosclerosis. 7. Emphysema (ICD10-J43.9). Electronically Signed   By: Kreg Shropshire M.D.   On: 06/08/2020 04:59    EKG: Independently reviewed.  Sinus rhythm Prolonged QT interval  Assessment/Plan Principal Problem:   Syncope and collapse Active Problems:   COPD (chronic obstructive pulmonary disease) (HCC)   Hypertension   Hypokalemia     Syncope and collapse Most likely secondary to orthostatic blood pressure changes Patient was hypotensive in the field with systolic blood pressure in the 80s and has responded to IV fluid resuscitation Orthostatic blood pressure checks Place patient on a cardiac monitor to rule out arrhythmias Obtain 2D echocardiogram to assess LVEF We will request cardiology consult   Hypokalemia Supplement potassium and obtain magnesium levels   Hypertension Hold lisinopril since patient was hypotensive upon arrival    COPD Not in acute  exacerbation Use bronchodilator therapy as needed  DVT prophylaxis:  SCD Code Status: Full code Family Communication: Greater than 50% of time was spent discussing plan of care with patient at the bedside.  All questions and concerns have been addressed.  He verbalizes understanding and agrees with the plan. Disposition Plan: Back to previous home environment Consults called: Cardiology    Cathan Gearin MD [facility]     06/08/2020, 9:08 AM

## 2020-06-08 NOTE — ED Notes (Signed)
Pt eating, pain meds given.  Pt watching tv waiting on admission bed.

## 2020-06-09 DIAGNOSIS — I5021 Acute systolic (congestive) heart failure: Secondary | ICD-10-CM

## 2020-06-09 DIAGNOSIS — I951 Orthostatic hypotension: Secondary | ICD-10-CM | POA: Diagnosis not present

## 2020-06-09 DIAGNOSIS — R55 Syncope and collapse: Secondary | ICD-10-CM | POA: Diagnosis not present

## 2020-06-09 LAB — BASIC METABOLIC PANEL
Anion gap: 7 (ref 5–15)
BUN: 9 mg/dL (ref 8–23)
CO2: 30 mmol/L (ref 22–32)
Calcium: 8.7 mg/dL — ABNORMAL LOW (ref 8.9–10.3)
Chloride: 99 mmol/L (ref 98–111)
Creatinine, Ser: 0.72 mg/dL (ref 0.61–1.24)
GFR calc Af Amer: >60 mL/min (ref >60)
GFR calc non Af Amer: >60 mL/min (ref >60)
Glucose, Bld: 99 mg/dL (ref 70–99)
Potassium: 4.1 mmol/L (ref 3.5–5.1)
Sodium: 136 mmol/L (ref 135–145)

## 2020-06-09 LAB — CBC
HCT: 45.1 % (ref 39.0–52.0)
Hemoglobin: 15.7 g/dL (ref 13.0–17.0)
MCH: 34 pg (ref 26.0–34.0)
MCHC: 34.8 g/dL (ref 30.0–36.0)
MCV: 97.6 fL (ref 80.0–100.0)
Platelets: 174 10*3/uL (ref 150–400)
RBC: 4.62 MIL/uL (ref 4.22–5.81)
RDW: 14.1 % (ref 11.5–15.5)
WBC: 7.6 10*3/uL (ref 4.0–10.5)
nRBC: 0 % (ref 0.0–0.2)

## 2020-06-09 LAB — GLUCOSE, CAPILLARY: Glucose-Capillary: 108 mg/dL — ABNORMAL HIGH (ref 70–99)

## 2020-06-09 LAB — URINE DRUG SCREEN, QUALITATIVE (ARMC ONLY)
Amphetamines, Ur Screen: NOT DETECTED
Barbiturates, Ur Screen: NOT DETECTED
Benzodiazepine, Ur Scrn: NOT DETECTED
Cannabinoid 50 Ng, Ur ~~LOC~~: NOT DETECTED
Cocaine Metabolite,Ur ~~LOC~~: POSITIVE — AB
MDMA (Ecstasy)Ur Screen: NOT DETECTED
Methadone Scn, Ur: NOT DETECTED
Opiate, Ur Screen: NOT DETECTED
Phencyclidine (PCP) Ur S: NOT DETECTED
Tricyclic, Ur Screen: NOT DETECTED

## 2020-06-09 LAB — MAGNESIUM: Magnesium: 1.8 mg/dL (ref 1.7–2.4)

## 2020-06-09 MED ORDER — SODIUM CHLORIDE 0.9% FLUSH
3.0000 mL | Freq: Two times a day (BID) | INTRAVENOUS | Status: DC
  Administered 2020-06-09 (×2): 3 mL via INTRAVENOUS

## 2020-06-09 MED ORDER — OXYCODONE HCL 5 MG PO TABS
10.0000 mg | ORAL_TABLET | Freq: Four times a day (QID) | ORAL | Status: DC | PRN
  Administered 2020-06-09 – 2020-06-10 (×4): 10 mg via ORAL
  Filled 2020-06-09 (×4): qty 2

## 2020-06-09 MED ORDER — LOSARTAN POTASSIUM 25 MG PO TABS
25.0000 mg | ORAL_TABLET | Freq: Every day | ORAL | Status: DC
  Administered 2020-06-09 – 2020-06-10 (×2): 25 mg via ORAL
  Filled 2020-06-09 (×2): qty 1

## 2020-06-09 MED ORDER — MAGNESIUM SULFATE 2 GM/50ML IV SOLN
2.0000 g | Freq: Once | INTRAVENOUS | Status: AC
  Administered 2020-06-09: 2 g via INTRAVENOUS
  Filled 2020-06-09: qty 50

## 2020-06-09 NOTE — H&P (View-Only) (Signed)
Progress Note  Patient Name: Kurt Blankenship Date of Encounter: 06/09/2020  Primary Cardiologist: Nelva Bush, MD  Subjective   No c/p or sob.  Still w/ posterior scalp and left shoulder pain s/p fall.  Inpatient Medications    Scheduled Meds: . atorvastatin  20 mg Oral Daily  . carvedilol  3.125 mg Oral BID WC  . colchicine  0.6 mg Oral Daily  . folic acid  1 mg Oral Daily  . gabapentin  300 mg Oral BID  . multivitamin with minerals  1 tablet Oral Daily  . nicotine  21 mg Transdermal Daily  . oxyCODONE  10 mg Oral Q6H  . sodium chloride flush  3 mL Intravenous Q12H  . thiamine  100 mg Oral Daily   Or  . thiamine  100 mg Intravenous Daily   Continuous Infusions: . magnesium sulfate bolus IVPB     PRN Meds: LORazepam **OR** LORazepam, meclizine   Vital Signs    Vitals:   06/09/20 0311 06/09/20 0500 06/09/20 0827 06/09/20 0900  BP: (!) 132/92  (!) 146/98 (!) 142/93  Pulse: 70  92 78  Resp: 20   20  Temp: 98 F (36.7 C)   97.9 F (36.6 C)  TempSrc: Oral   Oral  SpO2: 90%     Weight:  77.4 kg    Height:        Intake/Output Summary (Last 24 hours) at 06/09/2020 0931 Last data filed at 06/09/2020 0408 Gross per 24 hour  Intake 227.81 ml  Output 900 ml  Net -672.19 ml   Filed Weights   06/08/20 0348 06/09/20 0500  Weight: 75.8 kg 77.4 kg    Physical Exam   GEN: Well nourished, well developed, in no acute distress.  HEENT: Grossly normal.  Neck: Supple, no JVD, carotid bruits, or masses. Cardiac: RRR, no murmurs, rubs, or gallops. No clubbing, cyanosis, edema.  Radials/DP/PT 2+ and equal bilaterally.  Respiratory:  Respirations regular and unlabored, diminished breath sounds bilat w/ faint insp/exp wheezing. GI: Soft, nontender, nondistended, BS + x 4. MS: no deformity or atrophy. Skin: warm and dry, no rash. Neuro:  Strength and sensation are intact. Psych: AAOx3.  Normal affect.  Labs    Chemistry Recent Labs  Lab 06/08/20 0344  06/08/20 0921 06/09/20 0534  NA 139 137 136  K 3.4* 3.5 4.1  CL 104 101 99  CO2 26 28 30   GLUCOSE 126* 127* 99  BUN 6* 7* 9  CREATININE 0.92 0.81 0.72  CALCIUM 8.7* 8.7* 8.7*  PROT 5.7* 5.9*  --   ALBUMIN 3.3* 3.4*  --   AST 21 19  --   ALT 15 17  --   ALKPHOS 53 60  --   BILITOT 0.8 0.9  --   GFRNONAA >60 >60 >60  GFRAA >60 >60 >60  ANIONGAP 9 8 7      Hematology Recent Labs  Lab 06/08/20 0344 06/08/20 0921 06/09/20 0534  WBC 8.0 9.2 7.6  RBC 4.91 4.99 4.62  HGB 16.7 17.0 15.7  HCT 48.1 47.9 45.1  MCV 98.0 96.0 97.6  MCH 34.0 34.1* 34.0  MCHC 34.7 35.5 34.8  RDW 14.0 14.3 14.1  PLT 189 176 174    Cardiac Enzymes  Recent Labs  Lab 06/08/20 0344 06/08/20 0602  TROPONINIHS 31* 25*     Radiology    CT Head Wo Contrast  Result Date: 06/08/2020 CLINICAL DATA:  [place] backwards with posterior laceration EXAM: CT HEAD WITHOUT CONTRAST CT  CERVICAL SPINE WITHOUT CONTRAST TECHNIQUE: Multidetector CT imaging of the head and cervical spine was performed following the standard protocol without intravenous contrast. Multiplanar CT image reconstructions of the cervical spine were also generated. COMPARISON:  CT head and cervical spine 05/07/2020 FINDINGS: CT HEAD FINDINGS Brain: No evidence of acute infarction, hemorrhage, hydrocephalus, extra-axial collection or mass lesion/mass effect. Symmetric prominence of the ventricles, cisterns and sulci compatible with parenchymal volume loss. Patchy areas of white matter hypoattenuation are most compatible with chronic microvascular angiopathy. Vascular: Atherosclerotic calcification of the carotid siphons. No hyperdense vessel. Skull: High left parietal scalp laceration and contusive change with trace crescentic hematoma measuring up to 3 mm in maximal thickness (axial image 2/23). No subjacent calvarial fracture, sutural diastasis or other acute traumatic osseous abnormality. No worrisome osseous lesions. Plate screw fixation of the  superolateral right orbit without acute complication. Sinuses/Orbits: Paranasal sinuses and mastoid air cells are predominantly clear. Pneumatization of the petrous apices. Included orbital structures are unremarkable. Other: None. CT CERVICAL SPINE FINDINGS Alignment: Stabilization collar in place. Straightening of the normal cervical lordosis possibly related to this device. Mild anterolisthesis at C2-3 and C4-5 is unchanged from comparison sin favored to be on a degenerative basis. Minimal left lateral neck flexion. No evidence of traumatic listhesis. Bony fusion of the left C2-3 facet. Some asymmetric degenerative changes at the left C4-5 facet resulting in some mild widening. No other abnormally widened, perched or jumped facets. Normal alignment of the craniocervical and atlantoaxial articulations. Skull base and vertebrae: No acute vertebral body fracture or height loss. No visible skull base fractures. Mild sclerosis in the dens, likely degenerative and unchanged from prior. Stable sclerotic focus in the T2 spinous process. Bony fusion the left C2-3 facets, as above. Additional multilevel spondylitic changes and facet arthropathy, further detailed below. Soft tissues and spinal canal: No pre or paravertebral fluid or swelling. No visible canal hematoma. Enthesopathic mineralization in the nuchal ligament. Disc levels: Multilevel intervertebral disc height loss with spondylitic endplate changes. Posterior disc osteophyte complexes efface the ventral thecal sac most pronounced at C5-6 but without significant canal stenosis. Uncinate spurring and facet hypertrophic changes are present throughout the cervical spine with mild multilevel neural foraminal narrowing. More moderate to severe foraminal stenoses present at C 5-6, C6-7 bilaterally. Upper chest: Apical pleuroparenchymal scarring and emphysematous changes. Cervical carotid atherosclerosis. No acute abnormality in the upper chest or imaged lung apices.  Small amount of gas in the right jugular vein, likely related to intravenous access. Other: No worrisome thyroid nodules. IMPRESSION: 1. High left parietal scalp laceration and contusive change with trace crescentic hematoma measuring up to 3 mm in maximal thickness. No subjacent calvarial fracture, sutural diastasis or other acute traumatic osseous abnormality. 2. No acute intracranial abnormality. Background of chronic mild parenchymal volume loss and microvascular angiopathy. 3. No evidence of acute fracture or traumatic listhesis of the cervical spine. 4. Bony fusion of the left C2-3 facet. 5. 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 No significant canal stenosis. Moderate to severe bilateral foraminal narrowing C5-C7. 6. Cervical and intracranial atherosclerosis. 7. Emphysema (ICD10-J43.9). Electronically Signed   By: Price  DeHay M.D.   On: 06/08/2020 04:59   CT Cervical Spine Wo Contrast  Result Date: 06/08/2020 CLINICAL DATA:  Fell backwards with posterior laceration EXAM: CT HEAD WITHOUT CONTRAST CT CERVICAL SPINE WITHOUT CONTRAST TECHNIQUE: Multidetector CT imaging of the head and cervical spine was performed following the standard protocol without intravenous contrast. Multiplanar CT image reconstructions   of the cervical spine were also generated. COMPARISON:  CT head and cervical spine 05/07/2020 FINDINGS: CT HEAD FINDINGS Brain: No evidence of acute infarction, hemorrhage, hydrocephalus, extra-axial collection or mass lesion/mass effect. Symmetric prominence of the ventricles, cisterns and sulci compatible with parenchymal volume loss. Patchy areas of white matter hypoattenuation are most compatible with chronic microvascular angiopathy. Vascular: Atherosclerotic calcification of the carotid siphons. No hyperdense vessel. Skull: High left parietal scalp laceration and contusive change with trace crescentic hematoma measuring up to 3 mm in maximal  thickness (axial image 2/23). No subjacent calvarial fracture, sutural diastasis or other acute traumatic osseous abnormality. No worrisome osseous lesions. Plate screw fixation of the superolateral right orbit without acute complication. Sinuses/Orbits: Paranasal sinuses and mastoid air cells are predominantly clear. Pneumatization of the petrous apices. Included orbital structures are unremarkable. Other: None. CT CERVICAL SPINE FINDINGS Alignment: Stabilization collar in place. Straightening of the normal cervical lordosis possibly related to this device. Mild anterolisthesis at C2-3 and C4-5 is unchanged from comparison sin favored to be on a degenerative basis. Minimal left lateral neck flexion. No evidence of traumatic listhesis. Bony fusion of the left C2-3 facet. Some asymmetric degenerative changes at the left C4-5 facet resulting in some mild widening. No other abnormally widened, perched or jumped facets. Normal alignment of the craniocervical and atlantoaxial articulations. Skull base and vertebrae: No acute vertebral body fracture or height loss. No visible skull base fractures. Mild sclerosis in the dens, likely degenerative and unchanged from prior. Stable sclerotic focus in the T2 spinous process. Bony fusion the left C2-3 facets, as above. Additional multilevel spondylitic changes and facet arthropathy, further detailed below. Soft tissues and spinal canal: No pre or paravertebral fluid or swelling. No visible canal hematoma. Enthesopathic mineralization in the nuchal ligament. Disc levels: Multilevel intervertebral disc height loss with spondylitic endplate changes. Posterior disc osteophyte complexes efface the ventral thecal sac most pronounced at C5-6 but without significant canal stenosis. Uncinate spurring and facet hypertrophic changes are present throughout the cervical spine with mild multilevel neural foraminal narrowing. More moderate to severe foraminal stenoses present at C 5-6, C6-7  bilaterally. Upper chest: Apical pleuroparenchymal scarring and emphysematous changes. Cervical carotid atherosclerosis. No acute abnormality in the upper chest or imaged lung apices. Small amount of gas in the right jugular vein, likely related to intravenous access. Other: No worrisome thyroid nodules. IMPRESSION: 1. High left parietal scalp laceration and contusive change with trace crescentic hematoma measuring up to 3 mm in maximal thickness. No subjacent calvarial fracture, sutural diastasis or other acute traumatic osseous abnormality. 2. No acute intracranial abnormality. Background of chronic mild parenchymal volume loss and microvascular angiopathy. 3. No evidence of acute fracture or traumatic listhesis of the cervical spine. 4. Bony fusion of the left C2-3 facet. 5. 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 No significant canal stenosis. Moderate to severe bilateral foraminal narrowing C5-C7. 6. Cervical and intracranial atherosclerosis. 7. Emphysema (ICD10-J43.9). Electronically Signed   By: Kreg Shropshire M.D.   On: 06/08/2020 04:59   Telemetry    RSR, PVCs. QT 370, QTc 430 - Personally Reviewed  Cardiac Studies   2D Echocardiogram 6.23.2021    1. Left ventricular ejection fraction, by estimation, is 20 to 25%. The  left ventricle has severely decreased function. The left ventricle  demonstrates global hypokinesis. Left ventricular diastolic function could not be evaluated.   2. Right ventricular systolic function is normal. The right ventricular  size is mildly enlarged.  3. The mitral valve was not well visualized. No evidence of mitral valve  regurgitation.   4. The aortic valve was not well visualized. Aortic valve regurgitation  not well assessed.   5. The inferior vena cava is dilated in size with >50% respiratory  variability, suggesting right atrial pressure of 8 mmHg.  _____________   Patient Profile      64 y.o. male with  a history of HTN, tob abuse, COPD, alcohol use, and chronic low back pain req narcotics, who was admitted 6/23 following fall and LOC after hitting his head on the floor, and was subsequently found to have a prolonged QT and an EF of 20-25% by echo in the setting of etoh abuse (intoxication) and UDS + for cocaine.  Assessment & Plan    1.  Loss of consciousness:  Larey Seat in his home in the early morning hours of 6/23 in the setting of his left knee locking up, falling, and striking his head on the floor.  In ED, noted to have prolonged QT (499) in the setting of hypokalemia, hypomagnesemia, and hypocalcemia. ETOH 114.  UDS + for cocaine. Though loss of consciousness does not appear to be arrhythmic/cardiogenic in nature, Echo has shown LV dysfxn w/ an EF of 20-25%.  No events on tele.  QT/QTc improved this AM.  See below.  2.  Cardiomyopathy:  EF 20-25%.  No prior h/o c/p or dyspnea.  + FH of premature CAD (brother died of MI in late 24's; mother died w/ CHF in late 53's).  HsT elevated on arrival (90) but has since trended down.  Euvolemic on exam.  UDS + for cocaine but pt denies usage - says he is regularly around friends that smoke crack in his presence though.  Placed on  blocker and counseled on potential risks of cocaine exposure and  blocker therapy.  As bp stable, will add losartan and can look to transition to entresto if he tolerates/can afford (was on lisinopril @ home - last dose 6/22).  Will plan on R & L heart cath tomorrow morning as scheduling in cath lab will not allow for procedure today. The patient understands that risks include but are not limited to stroke (1 in 1000), death (1 in 1000), kidney failure [usually temporary] (1 in 500), bleeding (1 in 200), allergic reaction [possibly serious] (1 in 200), and agrees to proceed. .  3.  Essential HTN:  Cont  blocker.  Adding back ARB.  4.  ETOH Abuse:  Cessation advised.  5.  Cocaine exposure:  UDS + for cocaine.  Pt says he has  exposure through friends that smoke crack, but he doesn't use himself.  Advised to avoid in setting of cardiomyopathy and addition of  blocker therapy.  6.  Elevated HsTroponin:  In setting of above.  Right and left heart cath today.  Signed, Nicolasa Ducking, NP  06/09/2020, 9:31 AM    For questions or updates, please contact   Please consult [email or web address] for contact info under Cardiology/STEMI.

## 2020-06-09 NOTE — Progress Notes (Signed)
PROGRESS NOTE    [name]  ZOX:[id number] DOB: 08-20-1956 DOA: 06/08/2020 PCP: Marshell Garfinkel, MD   Brief Narrative:  Kurt Blankenship is a 64 y.o. male with medical history significant for hypertension and COPD.  He was brought into the emergency room after he had a syncopal episode at home.  Patient stated that he had gotten up to use the bathroom and his knees gave out resulting in a fall backwards.  Patient thinks he may have lost consciousness and states that his family heard him fall and found him on the floor unresponsive and called EMS.  Patient has elevated ethanol level at 114, UDS positive for cocaine and he took his trazodone 100 mg before going to bed.  Fall and syncopal episode most likely secondary to intoxication. Also found to have new diagnosis of EF of 20-25%.  No prior personal cardiac history but has an extensive family cardiac history.  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  Cardiology was consulted and going for right and left cardiac catheterization tomorrow.  Subjective: Patient has no new complaints when seen today.  No chest pain or shortness of breath.  He denies any prior cardiac history.  He also denied that he ever used cocaine, stating that all of his friends smoke cocaine in his presence and he never used it.  Assessment & Plan:   Principal Problem:   Syncope and collapse Active Problems:   COPD (chronic obstructive pulmonary disease) (HCC)   Hypertension   Hypokalemia  Syncope and collapse.  Most likely secondary to intoxication as his ethanol levels were elevated and UDS was positive for cocaine although he denies the use of it.  He did took his trazodone 100 mg before going to bed.  Cardiac arrhythmia cannot be ruled out completely as he found to have low EF. Now appears at his baseline.  HFrEF.  Echocardiogram with EF of 20 to 25%.  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 Extensive family cardiac  history.  Cardiology was consulted. -They start him on low-dose losartan and carvedilol. -For right and left heart catheterization tomorrow.  Alcohol abuse/cocaine abuse.  Patient was counseled against alcohol and cocaine use.  He declined the use of cocaine stating that his friends smoke cocaine in his presence?? Patient was started on low-dose carvedilol and we discussed the risk using cocaine with beta-blocker. -Continue with CIWA protocol.  Hypertension.  He was on lisinopril at home which was held initially due to softer blood pressure. -Cardiology started him on low-dose carvedilol and losartan.  Hypokalemia.  Resolved.  Magnesium was 1.8. -I will replete magnesium to keep it above 2 due to concern of arrhythmia secondary to low EF.  COPD.  No acute concern -Continue as needed bronchodilators.  Objective: Vitals:   06/09/20 0500 06/09/20 0827 06/09/20 0900 06/09/20 1226  BP:  (!) 146/98 (!) 142/93 131/81  Pulse:  92 78 72  Resp:   20 18  Temp:   97.9 F (36.6 C) 97.9 F (36.6 C)  TempSrc:   Oral Oral  SpO2:    92%  Weight: 77.4 kg     Height:        Intake/Output Summary (Last 24 hours) at 06/09/2020 1530 Last data filed at 06/09/2020 0408 Gross per 24 hour  Intake --  Output 900 ml  Net -900 ml   Filed Weights   06/08/20 0348 06/09/20 0500  Weight: 75.8 kg 77.4 kg    Examination:  General exam: Appears calm and comfortable  Respiratory system: Clear to auscultation. Respiratory effort normal. Cardiovascular system: S1 & S2 heard, RRR. No JVD, murmurs, rubs, Gastrointestinal system: Soft, nontender, nondistended, bowel sounds positive. Central nervous system: Alert and oriented. No focal neurological deficits.Symmetric 5 x 5 power. Extremities: No edema, no cyanosis, pulses intact and symmetrical. Psychiatry: Judgement and insight appear normal.   DVT prophylaxis: SCDs Code Status: Full Family Communication: Cussed with patient Disposition Plan:  Status  is: Observation  The patient remains OBS appropriate and will d/c before 2 midnights.  Dispo: The patient is from: Home              Anticipated d/c is to: Home              Anticipated d/c date is: 1 day              Patient currently is medically stable to d/c.  He is going for right and left cardiac catheterization for his new diagnosis of reduced EF tomorrow. If remains stable can be discharged after that.  Consultants:   Cardiology  Procedures:  Antimicrobials:   Data Reviewed: I have personally reviewed following labs and imaging studies  CBC: Recent Labs  Lab 06/08/20 0344 06/08/20 0921 06/09/20 0534  WBC 8.0 9.2 7.6  HGB 16.7 17.0 15.7  HCT 48.1 47.9 45.1  MCV 98.0 96.0 97.6  PLT 189 176 174   Basic Metabolic Panel: Recent Labs  Lab 06/08/20 0344 06/08/20 0921 06/09/20 0534  NA 139 137 136  K 3.4* 3.5 4.1  CL 104 101 99  CO2 26 28 30   GLUCOSE 126* 127* 99  BUN 6* 7* 9  CREATININE 0.92 0.81 0.72  CALCIUM 8.7* 8.7* 8.7*  MG  --  1.7 1.8  PHOS  --  3.7  --    GFR: Estimated Creatinine Clearance: 98.9 mL/min (by C-G formula based on SCr of 0.72 mg/dL). Liver Function Tests: Recent Labs  Lab 06/08/20 0344 06/08/20 0921  AST 21 19  ALT 15 17  ALKPHOS 53 60  BILITOT 0.8 0.9  PROT 5.7* 5.9*  ALBUMIN 3.3* 3.4*   No results for input(s): LIPASE, AMYLASE in the last 168 hours. No results for input(s): AMMONIA in the last 168 hours. Coagulation Profile: No results for input(s): INR, PROTIME in the last 168 hours. Cardiac Enzymes: No results for input(s): CKTOTAL, CKMB, CKMBINDEX, TROPONINI in the last 168 hours. BNP (last 3 results) No results for input(s): PROBNP in the last 8760 hours. HbA1C: No results for input(s): HGBA1C in the last 72 hours. CBG: Recent Labs  Lab 06/09/20 0650  GLUCAP 108*   Lipid Profile: No results for input(s): CHOL, HDL, LDLCALC, TRIG, CHOLHDL, LDLDIRECT in the last 72 hours. Thyroid Function Tests: No results for  input(s): TSH, T4TOTAL, FREET4, T3FREE, THYROIDAB in the last 72 hours. Anemia Panel: No results for input(s): VITAMINB12, FOLATE, FERRITIN, TIBC, IRON, RETICCTPCT in the last 72 hours. Sepsis Labs: No results for input(s): PROCALCITON, LATICACIDVEN in the last 168 hours.  Recent Results (from the past 240 hour(s))  SARS Coronavirus 2 by RT PCR (hospital order, performed in [facility] hospital lab) Nasopharyngeal Nasopharyngeal Swab     Status: None   Collection Time: 06/08/20  9:21 AM   Specimen: Nasopharyngeal Swab  Result Value Ref Range Status   SARS Coronavirus 2 NEGATIVE NEGATIVE Final    Comment: (NOTE) SARS-CoV-2 target nucleic acids are NOT DETECTED.  The SARS-CoV-2 RNA is generally detectable in upper  and lower respiratory specimens during the acute phase of infection. The lowest concentration of SARS-CoV-2 viral copies this assay can detect is 250 copies / mL. A negative result does not preclude SARS-CoV-2 infection and should not be used as the sole basis for treatment or other patient management decisions.  A negative result may occur with improper specimen collection / handling, submission of specimen other than nasopharyngeal swab, presence of viral mutation(s) within the areas targeted by this assay, and inadequate number of viral copies (<250 copies / mL). A negative result must be combined with clinical observations, patient history, and epidemiological information.  Fact Sheet for Patients:   BoilerBrush.com.cy  Fact Sheet for Healthcare Providers: https://pope.com/  This test is not yet approved or  cleared by the [place] FDA and has been authorized for detection and/or diagnosis of SARS-CoV-2 by FDA under an Emergency Use Authorization (EUA).  This EUA will remain in effect (meaning this test can be used) for the duration of the COVID-19 declaration under Section 564(b)(1) of the Act, 21 U.S.C. section  360bbb-3(b)(1), unless the authorization is terminated or revoked sooner.  Performed at [facility], [address] [place], [place] [address]      Radiology Studies: CT Head Wo Contrast  Result Date: 06/08/2020 CLINICAL DATA:  Larey Seat backwards with posterior laceration EXAM: CT HEAD WITHOUT CONTRAST CT CERVICAL SPINE WITHOUT CONTRAST TECHNIQUE: Multidetector CT imaging of the head and cervical spine was performed following the standard protocol without intravenous contrast. Multiplanar CT image reconstructions of the cervical spine were also generated. COMPARISON:  CT head and cervical spine 05/07/2020 FINDINGS: CT HEAD FINDINGS Brain: No evidence of acute infarction, hemorrhage, hydrocephalus, extra-axial collection or mass lesion/mass effect. Symmetric prominence of the ventricles, cisterns and sulci compatible with parenchymal volume loss. Patchy areas of white matter hypoattenuation are most compatible with chronic microvascular angiopathy. Vascular: Atherosclerotic calcification of the carotid siphons. No hyperdense vessel. Skull: High left parietal scalp laceration and contusive change with trace crescentic hematoma measuring up to 3 mm in maximal thickness (axial image 2/23). No subjacent calvarial fracture, sutural diastasis or other acute traumatic osseous abnormality. No worrisome osseous lesions. Plate screw fixation of the superolateral right orbit without acute complication. Sinuses/Orbits: Paranasal sinuses and mastoid air cells are predominantly clear. Pneumatization of the petrous apices. Included orbital structures are unremarkable. Other: None. CT CERVICAL SPINE FINDINGS Alignment: Stabilization collar in place. Straightening of the normal cervical lordosis possibly related to this device. Mild anterolisthesis at C2-3 and C4-5 is unchanged from comparison sin favored to be on a degenerative basis. Minimal left lateral neck flexion. No evidence of traumatic listhesis. Bony  fusion of the left C2-3 facet. Some asymmetric degenerative changes at the left C4-5 facet resulting in some mild widening. No other abnormally widened, perched or jumped facets. Normal alignment of the craniocervical and atlantoaxial articulations. Skull base and vertebrae: No acute vertebral body fracture or height loss. No visible skull base fractures. Mild sclerosis in the dens, likely degenerative and unchanged from prior. Stable sclerotic focus in the T2 spinous process. Bony fusion the left C2-3 facets, as above. Additional multilevel spondylitic changes and facet arthropathy, further detailed below. Soft tissues and spinal canal: No pre or paravertebral fluid or swelling. No visible canal hematoma. Enthesopathic mineralization in the nuchal ligament. Disc levels: Multilevel intervertebral disc height loss with spondylitic endplate changes. Posterior disc osteophyte complexes efface the ventral thecal sac most pronounced at C5-6 but without significant canal stenosis. Uncinate spurring and facet hypertrophic changes are  present throughout the cervical spine with mild multilevel neural foraminal narrowing. More moderate to severe foraminal stenoses present at C 5-6, C6-7 bilaterally. Upper chest: Apical pleuroparenchymal scarring and emphysematous changes. Cervical carotid atherosclerosis. No acute abnormality in the upper chest or imaged lung apices. Small amount of gas in the right jugular vein, likely related to intravenous access. Other: No worrisome thyroid nodules. IMPRESSION: 1. High left parietal scalp laceration and contusive change with trace crescentic hematoma measuring up to 3 mm in maximal thickness. No subjacent calvarial fracture, sutural diastasis or other acute traumatic osseous abnormality. 2. No acute intracranial abnormality. Background of chronic mild parenchymal volume loss and microvascular angiopathy. 3. No evidence of acute fracture or traumatic listhesis of the cervical spine. 4.  Bony fusion of the left C2-3 facet. 5. 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 No significant canal stenosis. Moderate to severe bilateral foraminal narrowing C5-C7. 6. Cervical and intracranial atherosclerosis. 7. Emphysema (ICD10-J43.9). Electronically Signed   By: Kreg Shropshire M.D.   On: 06/08/2020 04:59   CT Cervical Spine Wo Contrast  Result Date: 06/08/2020 CLINICAL DATA:  Larey Seat backwards with posterior laceration EXAM: CT HEAD WITHOUT CONTRAST CT CERVICAL SPINE WITHOUT CONTRAST TECHNIQUE: Multidetector CT imaging of the head and cervical spine was performed following the standard protocol without intravenous contrast. Multiplanar CT image reconstructions of the cervical spine were also generated. COMPARISON:  CT head and cervical spine 05/07/2020 FINDINGS: CT HEAD FINDINGS Brain: No evidence of acute infarction, hemorrhage, hydrocephalus, extra-axial collection or mass lesion/mass effect. Symmetric prominence of the ventricles, cisterns and sulci compatible with parenchymal volume loss. Patchy areas of white matter hypoattenuation are most compatible with chronic microvascular angiopathy. Vascular: Atherosclerotic calcification of the carotid siphons. No hyperdense vessel. Skull: High left parietal scalp laceration and contusive change with trace crescentic hematoma measuring up to 3 mm in maximal thickness (axial image 2/23). No subjacent calvarial fracture, sutural diastasis or other acute traumatic osseous abnormality. No worrisome osseous lesions. Plate screw fixation of the superolateral right orbit without acute complication. Sinuses/Orbits: Paranasal sinuses and mastoid air cells are predominantly clear. Pneumatization of the petrous apices. Included orbital structures are unremarkable. Other: None. CT CERVICAL SPINE FINDINGS Alignment: Stabilization collar in place. Straightening of the normal cervical lordosis possibly related to this device. Mild  anterolisthesis at C2-3 and C4-5 is unchanged from comparison sin favored to be on a degenerative basis. Minimal left lateral neck flexion. No evidence of traumatic listhesis. Bony fusion of the left C2-3 facet. Some asymmetric degenerative changes at the left C4-5 facet resulting in some mild widening. No other abnormally widened, perched or jumped facets. Normal alignment of the craniocervical and atlantoaxial articulations. Skull base and vertebrae: No acute vertebral body fracture or height loss. No visible skull base fractures. Mild sclerosis in the dens, likely degenerative and unchanged from prior. Stable sclerotic focus in the T2 spinous process. Bony fusion the left C2-3 facets, as above. Additional multilevel spondylitic changes and facet arthropathy, further detailed below. Soft tissues and spinal canal: No pre or paravertebral fluid or swelling. No visible canal hematoma. Enthesopathic mineralization in the nuchal ligament. Disc levels: Multilevel intervertebral disc height loss with spondylitic endplate changes. Posterior disc osteophyte complexes efface the ventral thecal sac most pronounced at C5-6 but without significant canal stenosis. Uncinate spurring and facet hypertrophic changes are present throughout the cervical spine with mild multilevel neural foraminal narrowing. More moderate to severe foraminal stenoses present at C 5-6, C6-7 bilaterally. Upper chest: Apical pleuroparenchymal  scarring and emphysematous changes. Cervical carotid atherosclerosis. No acute abnormality in the upper chest or imaged lung apices. Small amount of gas in the right jugular vein, likely related to intravenous access. Other: No worrisome thyroid nodules. IMPRESSION: 1. High left parietal scalp laceration and contusive change with trace crescentic hematoma measuring up to 3 mm in maximal thickness. No subjacent calvarial fracture, sutural diastasis or other acute traumatic osseous abnormality. 2. No acute  intracranial abnormality. Background of chronic mild parenchymal volume loss and microvascular angiopathy. 3. No evidence of acute fracture or traumatic listhesis of the cervical spine. 4. Bony fusion of the left C2-3 facet. 5. 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 No significant canal stenosis. Moderate to severe bilateral foraminal narrowing C5-C7. 6. Cervical and intracranial atherosclerosis. 7. Emphysema (ICD10-J43.9). Electronically Signed   By: Kreg Shropshire M.D.   On: 06/08/2020 04:59   ECHOCARDIOGRAM COMPLETE  Result Date: 06/08/2020    ECHOCARDIOGRAM REPORT   Patient Name:   GREYSEN DEVINO Date of Exam: 06/08/2020 Medical Rec #:  [id number]      Height:       70.0 in Accession #:    [id number]     Weight:       167.1 lb Date of Birth:  1956/11/21       BSA:          1.934 m Patient Age:    63 years       BP:           137/85 mmHg Patient Gender: M              HR:           79 bpm. Exam Location:  [facility] Procedure: 2D Echo, Cardiac Doppler and Color Doppler Indications:     Syncope 780.2  History:         Patient has no prior history of Echocardiogram examinations.                  COPD; Risk Factors:Hypertension.  Sonographer:     Cristela Blue RDCS (AE) Referring Phys:  [id number] Lucile Shutters Diagnosing Phys: Cristal Deer End MD  Sonographer Comments: Technically challenging study due to limited acoustic windows and no apical window. Image acquisition challenging due to patient body habitus. IMPRESSIONS  1. Left ventricular ejection fraction, by estimation, is 20 to 25%. The left ventricle has severely decreased function. The left ventricle demonstrates global hypokinesis. Left ventricular diastolic function could not be evaluated.  2. Right ventricular systolic function is normal. The right ventricular size is mildly enlarged.  3. The mitral valve was not well visualized. No evidence of mitral valve regurgitation.  4. The aortic valve was not well visualized.  Aortic valve regurgitation not well assessed.  5. The inferior vena cava is dilated in size with >50% respiratory variability, suggesting right atrial pressure of 8 mmHg. FINDINGS  Left Ventricle: Left ventricular ejection fraction, by estimation, is 20 to 25%. The left ventricle has severely decreased function. The left ventricle demonstrates global hypokinesis. The left ventricular internal cavity size was normal in size. There is no left ventricular hypertrophy. Left ventricular diastolic function could not be evaluated. Right Ventricle: The right ventricular size is mildly enlarged. No increase in right ventricular wall thickness. Right ventricular systolic function is normal. Left Atrium: Left atrial size was not well visualized. Right Atrium: Right atrial size was not well visualized. Pericardium: There is no evidence of pericardial effusion. Mitral Valve: The  mitral valve was not well visualized. There is mild thickening of the mitral valve leaflet(s). There is mild calcification of the mitral valve leaflet(s). No evidence of mitral valve regurgitation. Tricuspid Valve: The tricuspid valve is not well visualized. Tricuspid valve regurgitation is not demonstrated. Aortic Valve: The aortic valve was not well visualized. Aortic valve regurgitation not well assessed. Pulmonic Valve: The pulmonic valve was not well visualized. Pulmonic valve regurgitation is not visualized. No evidence of pulmonic stenosis. Aorta: The aortic root is normal in size and structure. Pulmonary Artery: The pulmonary artery is not well seen. Venous: The inferior vena cava is dilated in size with greater than 50% respiratory variability, suggesting right atrial pressure of 8 mmHg. IAS/Shunts: No atrial level shunt detected by color flow Doppler.  LEFT VENTRICLE PLAX 2D LVIDd:         5.43 cm LVIDs:         5.08 cm LV PW:         0.87 cm LV IVS:        0.95 cm LVOT diam:     2.00 cm LVOT Area:     3.14 cm  LEFT ATRIUM         Index LA  diam:    2.30 cm 1.19 cm/m                        PULMONIC VALVE AORTA                 PV Vmax:        0.56 m/s Ao Root diam: 3.30 cm PV Peak grad:   1.3 mmHg                       RVOT Peak grad: 2 mmHg   SHUNTS Systemic Diam: 2.00 cm Nelva Bush MD Electronically signed by Nelva Bush MD Signature Date/Time: 06/08/2020/1:27:40 PM    Final     Scheduled Meds: . atorvastatin  20 mg Oral Daily  . carvedilol  3.125 mg Oral BID WC  . colchicine  0.6 mg Oral Daily  . folic acid  1 mg Oral Daily  . gabapentin  300 mg Oral BID  . losartan  25 mg Oral Daily  . multivitamin with minerals  1 tablet Oral Daily  . nicotine  21 mg Transdermal Daily  . sodium chloride flush  3 mL Intravenous Q12H  . sodium chloride flush  3 mL Intravenous Q12H  . thiamine  100 mg Oral Daily   Or  . thiamine  100 mg Intravenous Daily   Continuous Infusions:   LOS: 0 days   Time spent: [place], MD [facility]  If 7PM-7AM, please contact night-coverage [email or web address]  06/09/2020, 3:30 PM   This record has been created using Systems analyst. Errors have been sought and corrected,but may not always be located. Such creation errors do not reflect on the standard of care.

## 2020-06-09 NOTE — Progress Notes (Addendum)
Progress Note  Patient Name: Kurt Blankenship Date of Encounter: 06/09/2020  Primary Cardiologist: Nelva Bush, MD  Subjective   No c/p or sob.  Still w/ posterior scalp and left shoulder pain s/p fall.  Inpatient Medications    Scheduled Meds: . atorvastatin  20 mg Oral Daily  . carvedilol  3.125 mg Oral BID WC  . colchicine  0.6 mg Oral Daily  . folic acid  1 mg Oral Daily  . gabapentin  300 mg Oral BID  . multivitamin with minerals  1 tablet Oral Daily  . nicotine  21 mg Transdermal Daily  . oxyCODONE  10 mg Oral Q6H  . sodium chloride flush  3 mL Intravenous Q12H  . thiamine  100 mg Oral Daily   Or  . thiamine  100 mg Intravenous Daily   Continuous Infusions: . magnesium sulfate bolus IVPB     PRN Meds: LORazepam **OR** LORazepam, meclizine   Vital Signs    Vitals:   06/09/20 0311 06/09/20 0500 06/09/20 0827 06/09/20 0900  BP: (!) 132/92  (!) 146/98 (!) 142/93  Pulse: 70  92 78  Resp: 20   20  Temp: 98 F (36.7 C)   97.9 F (36.6 C)  TempSrc: Oral   Oral  SpO2: 90%     Weight:  77.4 kg    Height:        Intake/Output Summary (Last 24 hours) at 06/09/2020 0931 Last data filed at 06/09/2020 0408 Gross per 24 hour  Intake 227.81 ml  Output 900 ml  Net -672.19 ml   Filed Weights   06/08/20 0348 06/09/20 0500  Weight: 75.8 kg 77.4 kg    Physical Exam   GEN: Well nourished, well developed, in no acute distress.  HEENT: Grossly normal.  Neck: Supple, no JVD, carotid bruits, or masses. Cardiac: RRR, no murmurs, rubs, or gallops. No clubbing, cyanosis, edema.  Radials/DP/PT 2+ and equal bilaterally.  Respiratory:  Respirations regular and unlabored, diminished breath sounds bilat w/ faint insp/exp wheezing. GI: Soft, nontender, nondistended, BS + x 4. MS: no deformity or atrophy. Skin: warm and dry, no rash. Neuro:  Strength and sensation are intact. Psych: AAOx3.  Normal affect.  Labs    Chemistry Recent Labs  Lab 06/08/20 0344  06/08/20 0921 06/09/20 0534  NA 139 137 136  K 3.4* 3.5 4.1  CL 104 101 99  CO2 26 28 30   GLUCOSE 126* 127* 99  BUN 6* 7* 9  CREATININE 0.92 0.81 0.72  CALCIUM 8.7* 8.7* 8.7*  PROT 5.7* 5.9*  --   ALBUMIN 3.3* 3.4*  --   AST 21 19  --   ALT 15 17  --   ALKPHOS 53 60  --   BILITOT 0.8 0.9  --   GFRNONAA >60 >60 >60  GFRAA >60 >60 >60  ANIONGAP 9 8 7      Hematology Recent Labs  Lab 06/08/20 0344 06/08/20 0921 06/09/20 0534  WBC 8.0 9.2 7.6  RBC 4.91 4.99 4.62  HGB 16.7 17.0 15.7  HCT 48.1 47.9 45.1  MCV 98.0 96.0 97.6  MCH 34.0 34.1* 34.0  MCHC 34.7 35.5 34.8  RDW 14.0 14.3 14.1  PLT 189 176 174    Cardiac Enzymes  Recent Labs  Lab 06/08/20 0344 06/08/20 0602  TROPONINIHS 31* 25*     Radiology    CT Head Wo Contrast  Result Date: 06/08/2020 CLINICAL DATA:  [place] backwards with posterior laceration EXAM: CT HEAD WITHOUT CONTRAST CT  CERVICAL SPINE WITHOUT CONTRAST TECHNIQUE: Multidetector CT imaging of the head and cervical spine was performed following the standard protocol without intravenous contrast. Multiplanar CT image reconstructions of the cervical spine were also generated. COMPARISON:  CT head and cervical spine 05/07/2020 FINDINGS: CT HEAD FINDINGS Brain: No evidence of acute infarction, hemorrhage, hydrocephalus, extra-axial collection or mass lesion/mass effect. Symmetric prominence of the ventricles, cisterns and sulci compatible with parenchymal volume loss. Patchy areas of white matter hypoattenuation are most compatible with chronic microvascular angiopathy. Vascular: Atherosclerotic calcification of the carotid siphons. No hyperdense vessel. Skull: High left parietal scalp laceration and contusive change with trace crescentic hematoma measuring up to 3 mm in maximal thickness (axial image 2/23). No subjacent calvarial fracture, sutural diastasis or other acute traumatic osseous abnormality. No worrisome osseous lesions. Plate screw fixation of the  superolateral right orbit without acute complication. Sinuses/Orbits: Paranasal sinuses and mastoid air cells are predominantly clear. Pneumatization of the petrous apices. Included orbital structures are unremarkable. Other: None. CT CERVICAL SPINE FINDINGS Alignment: Stabilization collar in place. Straightening of the normal cervical lordosis possibly related to this device. Mild anterolisthesis at C2-3 and C4-5 is unchanged from comparison sin favored to be on a degenerative basis. Minimal left lateral neck flexion. No evidence of traumatic listhesis. Bony fusion of the left C2-3 facet. Some asymmetric degenerative changes at the left C4-5 facet resulting in some mild widening. No other abnormally widened, perched or jumped facets. Normal alignment of the craniocervical and atlantoaxial articulations. Skull base and vertebrae: No acute vertebral body fracture or height loss. No visible skull base fractures. Mild sclerosis in the dens, likely degenerative and unchanged from prior. Stable sclerotic focus in the T2 spinous process. Bony fusion the left C2-3 facets, as above. Additional multilevel spondylitic changes and facet arthropathy, further detailed below. Soft tissues and spinal canal: No pre or paravertebral fluid or swelling. No visible canal hematoma. Enthesopathic mineralization in the nuchal ligament. Disc levels: Multilevel intervertebral disc height loss with spondylitic endplate changes. Posterior disc osteophyte complexes efface the ventral thecal sac most pronounced at C5-6 but without significant canal stenosis. Uncinate spurring and facet hypertrophic changes are present throughout the cervical spine with mild multilevel neural foraminal narrowing. More moderate to severe foraminal stenoses present at C 5-6, C6-7 bilaterally. Upper chest: Apical pleuroparenchymal scarring and emphysematous changes. Cervical carotid atherosclerosis. No acute abnormality in the upper chest or imaged lung apices.  Small amount of gas in the right jugular vein, likely related to intravenous access. Other: No worrisome thyroid nodules. IMPRESSION: 1. High left parietal scalp laceration and contusive change with trace crescentic hematoma measuring up to 3 mm in maximal thickness. No subjacent calvarial fracture, sutural diastasis or other acute traumatic osseous abnormality. 2. No acute intracranial abnormality. Background of chronic mild parenchymal volume loss and microvascular angiopathy. 3. No evidence of acute fracture or traumatic listhesis of the cervical spine. 4. Bony fusion of the left C2-3 facet. 5. 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 No significant canal stenosis. Moderate to severe bilateral foraminal narrowing C5-C7. 6. Cervical and intracranial atherosclerosis. 7. Emphysema (ICD10-J43.9). Electronically Signed   By: Kreg Shropshire M.D.   On: 06/08/2020 04:59   CT Cervical Spine Wo Contrast  Result Date: 06/08/2020 CLINICAL DATA:  [place] Seat backwards with posterior laceration EXAM: CT HEAD WITHOUT CONTRAST CT CERVICAL SPINE WITHOUT CONTRAST TECHNIQUE: Multidetector CT imaging of the head and cervical spine was performed following the standard protocol without intravenous contrast. Multiplanar CT image reconstructions  of the cervical spine were also generated. COMPARISON:  CT head and cervical spine 05/07/2020 FINDINGS: CT HEAD FINDINGS Brain: No evidence of acute infarction, hemorrhage, hydrocephalus, extra-axial collection or mass lesion/mass effect. Symmetric prominence of the ventricles, cisterns and sulci compatible with parenchymal volume loss. Patchy areas of white matter hypoattenuation are most compatible with chronic microvascular angiopathy. Vascular: Atherosclerotic calcification of the carotid siphons. No hyperdense vessel. Skull: High left parietal scalp laceration and contusive change with trace crescentic hematoma measuring up to 3 mm in maximal  thickness (axial image 2/23). No subjacent calvarial fracture, sutural diastasis or other acute traumatic osseous abnormality. No worrisome osseous lesions. Plate screw fixation of the superolateral right orbit without acute complication. Sinuses/Orbits: Paranasal sinuses and mastoid air cells are predominantly clear. Pneumatization of the petrous apices. Included orbital structures are unremarkable. Other: None. CT CERVICAL SPINE FINDINGS Alignment: Stabilization collar in place. Straightening of the normal cervical lordosis possibly related to this device. Mild anterolisthesis at C2-3 and C4-5 is unchanged from comparison sin favored to be on a degenerative basis. Minimal left lateral neck flexion. No evidence of traumatic listhesis. Bony fusion of the left C2-3 facet. Some asymmetric degenerative changes at the left C4-5 facet resulting in some mild widening. No other abnormally widened, perched or jumped facets. Normal alignment of the craniocervical and atlantoaxial articulations. Skull base and vertebrae: No acute vertebral body fracture or height loss. No visible skull base fractures. Mild sclerosis in the dens, likely degenerative and unchanged from prior. Stable sclerotic focus in the T2 spinous process. Bony fusion the left C2-3 facets, as above. Additional multilevel spondylitic changes and facet arthropathy, further detailed below. Soft tissues and spinal canal: No pre or paravertebral fluid or swelling. No visible canal hematoma. Enthesopathic mineralization in the nuchal ligament. Disc levels: Multilevel intervertebral disc height loss with spondylitic endplate changes. Posterior disc osteophyte complexes efface the ventral thecal sac most pronounced at C5-6 but without significant canal stenosis. Uncinate spurring and facet hypertrophic changes are present throughout the cervical spine with mild multilevel neural foraminal narrowing. More moderate to severe foraminal stenoses present at C 5-6, C6-7  bilaterally. Upper chest: Apical pleuroparenchymal scarring and emphysematous changes. Cervical carotid atherosclerosis. No acute abnormality in the upper chest or imaged lung apices. Small amount of gas in the right jugular vein, likely related to intravenous access. Other: No worrisome thyroid nodules. IMPRESSION: 1. High left parietal scalp laceration and contusive change with trace crescentic hematoma measuring up to 3 mm in maximal thickness. No subjacent calvarial fracture, sutural diastasis or other acute traumatic osseous abnormality. 2. No acute intracranial abnormality. Background of chronic mild parenchymal volume loss and microvascular angiopathy. 3. No evidence of acute fracture or traumatic listhesis of the cervical spine. 4. Bony fusion of the left C2-3 facet. 5. 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 No significant canal stenosis. Moderate to severe bilateral foraminal narrowing C5-C7. 6. Cervical and intracranial atherosclerosis. 7. Emphysema (ICD10-J43.9). Electronically Signed   By: Kreg Shropshire M.D.   On: 06/08/2020 04:59   Telemetry    RSR, PVCs. QT 370, QTc 430 - Personally Reviewed  Cardiac Studies   2D Echocardiogram 6.23.2021    1. Left ventricular ejection fraction, by estimation, is 20 to 25%. The  left ventricle has severely decreased function. The left ventricle  demonstrates global hypokinesis. Left ventricular diastolic function could not be evaluated.   2. Right ventricular systolic function is normal. The right ventricular  size is mildly enlarged.  3. The mitral valve was not well visualized. No evidence of mitral valve  regurgitation.   4. The aortic valve was not well visualized. Aortic valve regurgitation  not well assessed.   5. The inferior vena cava is dilated in size with >50% respiratory  variability, suggesting right atrial pressure of 8 mmHg.  _____________   Patient Profile      64 y.o. male with  a history of HTN, tob abuse, COPD, alcohol use, and chronic low back pain req narcotics, who was admitted 6/23 following fall and LOC after hitting his head on the floor, and was subsequently found to have a prolonged QT and an EF of 20-25% by echo in the setting of etoh abuse (intoxication) and UDS + for cocaine.  Assessment & Plan    1.  Loss of consciousness:  [place] Seat in his home in the early morning hours of 6/23 in the setting of his left knee locking up, falling, and striking his head on the floor.  In ED, noted to have prolonged QT (499) in the setting of hypokalemia, hypomagnesemia, and hypocalcemia. ETOH 114.  UDS + for cocaine. Though loss of consciousness does not appear to be arrhythmic/cardiogenic in nature, Echo has shown LV dysfxn w/ an EF of 20-25%.  No events on tele.  QT/QTc improved this AM.  See below.  2.  Cardiomyopathy:  EF 20-25%.  No prior h/o c/p or dyspnea.  + FH of premature CAD (brother died of MI in late 24's; mother died w/ CHF in late 53's).  HsT elevated on arrival (90) but has since trended down.  Euvolemic on exam.  UDS + for cocaine but pt denies usage - says he is regularly around friends that smoke crack in his presence though.  Placed on  blocker and counseled on potential risks of cocaine exposure and  blocker therapy.  As bp stable, will add losartan and can look to transition to entresto if he tolerates/can afford (was on lisinopril @ home - last dose 6/22).  Will plan on R & L heart cath tomorrow morning as scheduling in cath lab will not allow for procedure today. The patient understands that risks include but are not limited to stroke (1 in 1000), death (1 in 1000), kidney failure [usually temporary] (1 in 500), bleeding (1 in 200), allergic reaction [possibly serious] (1 in 200), and agrees to proceed. .  3.  Essential HTN:  Cont  blocker.  Adding back ARB.  4.  ETOH Abuse:  Cessation advised.  5.  Cocaine exposure:  UDS + for cocaine.  Pt says he has  exposure through friends that smoke crack, but he doesn't use himself.  Advised to avoid in setting of cardiomyopathy and addition of  blocker therapy.  6.  Elevated HsTroponin:  In setting of above.  Right and left heart cath today.  Signed, Nicolasa Ducking, NP  06/09/2020, 9:31 AM    For questions or updates, please contact   Please consult [email or web address] for contact info under Cardiology/STEMI.

## 2020-06-10 ENCOUNTER — Encounter
Admission: EM | Disposition: A | Discharge status: Home / Self Care | Payer: Self-pay | Source: Home / Self Care | Attending: Internal Medicine

## 2020-06-10 ENCOUNTER — Encounter: Payer: Self-pay | Admitting: Internal Medicine

## 2020-06-10 DIAGNOSIS — F10120 Alcohol abuse with intoxication, uncomplicated: Secondary | ICD-10-CM | POA: Diagnosis present

## 2020-06-10 DIAGNOSIS — Z79899 Other long term (current) drug therapy: Secondary | ICD-10-CM | POA: Diagnosis not present

## 2020-06-10 DIAGNOSIS — W19XXXA Unspecified fall, initial encounter: Secondary | ICD-10-CM | POA: Diagnosis not present

## 2020-06-10 DIAGNOSIS — Z825 Family history of asthma and other chronic lower respiratory diseases: Secondary | ICD-10-CM | POA: Diagnosis not present

## 2020-06-10 DIAGNOSIS — S060X9A Concussion with loss of consciousness of unspecified duration, initial encounter: Secondary | ICD-10-CM | POA: Diagnosis present

## 2020-06-10 DIAGNOSIS — I10 Essential (primary) hypertension: Secondary | ICD-10-CM

## 2020-06-10 DIAGNOSIS — W010XXA Fall on same level from slipping, tripping and stumbling without subsequent striking against object, initial encounter: Secondary | ICD-10-CM | POA: Diagnosis present

## 2020-06-10 DIAGNOSIS — Z79891 Long term (current) use of opiate analgesic: Secondary | ICD-10-CM | POA: Diagnosis not present

## 2020-06-10 DIAGNOSIS — F1490 Cocaine use, unspecified, uncomplicated: Secondary | ICD-10-CM | POA: Diagnosis present

## 2020-06-10 DIAGNOSIS — Z8249 Family history of ischemic heart disease and other diseases of the circulatory system: Secondary | ICD-10-CM | POA: Diagnosis not present

## 2020-06-10 DIAGNOSIS — E876 Hypokalemia: Secondary | ICD-10-CM | POA: Diagnosis present

## 2020-06-10 DIAGNOSIS — I2510 Atherosclerotic heart disease of native coronary artery without angina pectoris: Secondary | ICD-10-CM

## 2020-06-10 DIAGNOSIS — F17210 Nicotine dependence, cigarettes, uncomplicated: Secondary | ICD-10-CM | POA: Diagnosis present

## 2020-06-10 DIAGNOSIS — I426 Alcoholic cardiomyopathy: Secondary | ICD-10-CM | POA: Diagnosis not present

## 2020-06-10 DIAGNOSIS — E785 Hyperlipidemia, unspecified: Secondary | ICD-10-CM | POA: Diagnosis present

## 2020-06-10 DIAGNOSIS — J449 Chronic obstructive pulmonary disease, unspecified: Secondary | ICD-10-CM | POA: Diagnosis present

## 2020-06-10 DIAGNOSIS — Z7141 Alcohol abuse counseling and surveillance of alcoholic: Secondary | ICD-10-CM | POA: Diagnosis not present

## 2020-06-10 DIAGNOSIS — I110 Hypertensive heart disease with heart failure: Secondary | ICD-10-CM | POA: Diagnosis present

## 2020-06-10 DIAGNOSIS — Y905 Blood alcohol level of 100-119 mg/100 ml: Secondary | ICD-10-CM | POA: Diagnosis present

## 2020-06-10 DIAGNOSIS — Z7951 Long term (current) use of inhaled steroids: Secondary | ICD-10-CM | POA: Diagnosis not present

## 2020-06-10 DIAGNOSIS — Z716 Tobacco abuse counseling: Secondary | ICD-10-CM | POA: Diagnosis not present

## 2020-06-10 DIAGNOSIS — Z20822 Contact with and (suspected) exposure to covid-19: Secondary | ICD-10-CM | POA: Diagnosis present

## 2020-06-10 DIAGNOSIS — I951 Orthostatic hypotension: Secondary | ICD-10-CM | POA: Diagnosis present

## 2020-06-10 DIAGNOSIS — R55 Syncope and collapse: Secondary | ICD-10-CM | POA: Diagnosis present

## 2020-06-10 DIAGNOSIS — I5020 Unspecified systolic (congestive) heart failure: Secondary | ICD-10-CM | POA: Diagnosis present

## 2020-06-10 DIAGNOSIS — S0101XA Laceration without foreign body of scalp, initial encounter: Secondary | ICD-10-CM | POA: Diagnosis present

## 2020-06-10 DIAGNOSIS — I428 Other cardiomyopathies: Secondary | ICD-10-CM | POA: Diagnosis present

## 2020-06-10 DIAGNOSIS — I429 Cardiomyopathy, unspecified: Secondary | ICD-10-CM

## 2020-06-10 DIAGNOSIS — G8929 Other chronic pain: Secondary | ICD-10-CM | POA: Diagnosis present

## 2020-06-10 HISTORY — PX: RIGHT/LEFT HEART CATH AND CORONARY ANGIOGRAPHY: CATH118266

## 2020-06-10 SURGERY — RIGHT/LEFT HEART CATH AND CORONARY ANGIOGRAPHY
Anesthesia: Moderate Sedation

## 2020-06-10 MED ORDER — HEPARIN (PORCINE) IN NACL 1000-0.9 UT/500ML-% IV SOLN
INTRAVENOUS | Status: AC
  Filled 2020-06-10: qty 1000

## 2020-06-10 MED ORDER — IPRATROPIUM-ALBUTEROL 0.5-2.5 (3) MG/3ML IN SOLN: 3.0000 mL | Freq: Once | RESPIRATORY_TRACT | Status: DC

## 2020-06-10 MED ORDER — POTASSIUM CHLORIDE CRYS ER 20 MEQ PO TBCR: 20.0000 meq | EXTENDED_RELEASE_TABLET | Freq: Every day | ORAL | 0 refills | Status: DC

## 2020-06-10 MED ORDER — POTASSIUM CHLORIDE CRYS ER 20 MEQ PO TBCR: 20.0000 meq | EXTENDED_RELEASE_TABLET | Freq: Every day | ORAL | Status: DC

## 2020-06-10 MED ORDER — VERAPAMIL HCL 2.5 MG/ML IV SOLN
INTRAVENOUS | Status: AC
  Filled 2020-06-10: qty 2

## 2020-06-10 MED ORDER — IPRATROPIUM-ALBUTEROL 0.5-2.5 (3) MG/3ML IN SOLN
RESPIRATORY_TRACT | Status: AC
  Filled 2020-06-10: qty 3

## 2020-06-10 MED ORDER — LOSARTAN POTASSIUM 25 MG PO TABS: 25.0000 mg | ORAL_TABLET | Freq: Every day | ORAL | 0 refills | Status: DC

## 2020-06-10 MED ORDER — MIDAZOLAM HCL 2 MG/2ML IJ SOLN
INTRAMUSCULAR | Status: AC
  Filled 2020-06-10: qty 2

## 2020-06-10 MED ORDER — SODIUM CHLORIDE 0.9 % IV SOLN: 250.0000 mL | INTRAVENOUS | Status: DC | PRN

## 2020-06-10 MED ORDER — HEPARIN SODIUM (PORCINE) 1000 UNIT/ML IJ SOLN
INTRAMUSCULAR | Status: DC | PRN
  Administered 2020-06-10: 4000 [IU] via INTRAVENOUS

## 2020-06-10 MED ORDER — MAGNESIUM SULFATE 2 GM/50ML IV SOLN: 2.0000 g | Freq: Once | INTRAVENOUS | Status: DC

## 2020-06-10 MED ORDER — ASPIRIN 81 MG PO CHEW
CHEWABLE_TABLET | ORAL | Status: AC
  Filled 2020-06-10: qty 1

## 2020-06-10 MED ORDER — LIDOCAINE HCL (PF) 1 % IJ SOLN
INTRAMUSCULAR | Status: AC
  Filled 2020-06-10: qty 30

## 2020-06-10 MED ORDER — CARVEDILOL 3.125 MG PO TABS: 3.1250 mg | ORAL_TABLET | Freq: Two times a day (BID) | ORAL | 0 refills | Status: DC

## 2020-06-10 MED ORDER — MIDAZOLAM HCL 2 MG/2ML IJ SOLN
INTRAMUSCULAR | Status: DC | PRN
  Administered 2020-06-10: 1 mg via INTRAVENOUS

## 2020-06-10 MED ORDER — FENTANYL CITRATE (PF) 100 MCG/2ML IJ SOLN
INTRAMUSCULAR | Status: DC | PRN
  Administered 2020-06-10: 25 ug via INTRAVENOUS

## 2020-06-10 MED ORDER — IOHEXOL 300 MG/ML  SOLN
INTRAMUSCULAR | Status: DC | PRN
  Administered 2020-06-10: 25 mL

## 2020-06-10 MED ORDER — HEPARIN SODIUM (PORCINE) 1000 UNIT/ML IJ SOLN
INTRAMUSCULAR | Status: AC
  Filled 2020-06-10: qty 1

## 2020-06-10 MED ORDER — FENTANYL CITRATE (PF) 100 MCG/2ML IJ SOLN
INTRAMUSCULAR | Status: AC
  Filled 2020-06-10: qty 2

## 2020-06-10 MED ORDER — ASPIRIN 81 MG PO CHEW
CHEWABLE_TABLET | ORAL | Status: DC | PRN
  Administered 2020-06-10: 81 mg via ORAL

## 2020-06-10 MED ORDER — SODIUM CHLORIDE 0.9% FLUSH: 3.0000 mL | INTRAVENOUS | Status: DC | PRN

## 2020-06-10 MED ORDER — SODIUM CHLORIDE 0.9 % IV SOLN: INTRAVENOUS | Status: DC

## 2020-06-10 MED ORDER — LIDOCAINE HCL (PF) 1 % IJ SOLN
INTRAMUSCULAR | Status: DC | PRN
  Administered 2020-06-10: 2 mL

## 2020-06-10 MED ORDER — FUROSEMIDE 20 MG PO TABS: 20.0000 mg | ORAL_TABLET | Freq: Every day | ORAL | 0 refills | Status: DC

## 2020-06-10 MED ORDER — FUROSEMIDE 20 MG PO TABS: 20.0000 mg | ORAL_TABLET | Freq: Every day | ORAL | Status: DC

## 2020-06-10 SURGICAL SUPPLY — 10 items
CATH 5F 110X4 TIG (CATHETERS) ×1 IMPLANT
CATH BALLN WEDGE 5F 110CM (CATHETERS) ×1 IMPLANT
DEVICE RAD TR BAND REGULAR (VASCULAR PRODUCTS) ×1 IMPLANT
GLIDESHEATH SLEND SS 6F .021 (SHEATH) ×1 IMPLANT
GUIDEWIRE .025 260CM (WIRE) ×1 IMPLANT
GUIDEWIRE INQWIRE 1.5J.035X260 (WIRE) IMPLANT
INQWIRE 1.5J .035X260CM (WIRE) ×2
KIT MANI 3VAL PERCEP (MISCELLANEOUS) ×2 IMPLANT
PACK CARDIAC CATH (CUSTOM PROCEDURE TRAY) ×2 IMPLANT
SHEATH GLIDE SLENDER 4/5FR (SHEATH) ×1 IMPLANT

## 2020-06-10 NOTE — Progress Notes (Addendum)
Progress Note  Patient Name: Kurt Blankenship Date of Encounter: 06/10/2020  [facility] Cardiologist: Yvonne Kendall, MD   Subjective   Patient complains of shortness of breath this morning, improved with DuoNeb.  No chest pain.  He still has some discomfort along his posterior scalp and left shoulder.  Inpatient Medications    Scheduled Meds: . [MAR Hold] atorvastatin  20 mg Oral Daily  . [MAR Hold] carvedilol  3.125 mg Oral BID WC  . [MAR Hold] colchicine  0.6 mg Oral Daily  . [MAR Hold] folic acid  1 mg Oral Daily  . [MAR Hold] gabapentin  300 mg Oral BID  . [MAR Hold] ipratropium-albuterol  3 mL Nebulization Once  . ipratropium-albuterol      . [MAR Hold] losartan  25 mg Oral Daily  . [MAR Hold] multivitamin with minerals  1 tablet Oral Daily  . [MAR Hold] nicotine  21 mg Transdermal Daily  . [MAR Hold] sodium chloride flush  3 mL Intravenous Q12H  . [MAR Hold] sodium chloride flush  3 mL Intravenous Q12H  . [MAR Hold] thiamine  100 mg Oral Daily   Or  . [MAR Hold] thiamine  100 mg Intravenous Daily   Continuous Infusions: . sodium chloride 20 mL/hr at 06/10/20 0813  . sodium chloride 50 mL/hr at 06/10/20 0656   PRN Meds: sodium chloride, [MAR Hold] LORazepam **OR** [MAR Hold] LORazepam, [MAR Hold] meclizine, [MAR Hold] oxyCODONE, sodium chloride flush   Vital Signs    Vitals:   06/10/20 0009 06/10/20 0727 06/10/20 0803 06/10/20 0855  BP: (!) 155/85 130/83  140/69  Pulse: 71 75  61  Resp: 17 20  10   Temp: 98.5 F (36.9 C) (!) 97.2 F (36.2 C)    TempSrc:  Oral    SpO2: 95% 94% 97% 92%  Weight:      Height:        Intake/Output Summary (Last 24 hours) at 06/10/2020 0858 Last data filed at 06/09/2020 2100 Gross per 24 hour  Intake 0 ml  Output --  Net 0 ml   Last 3 Weights 06/09/2020 06/08/2020 05/07/2020  Weight (lbs) 170 lb 10.2 oz 167 lb 1.7 oz 167 lb  Weight (kg) 77.4 kg 75.8 kg 75.751 kg      Telemetry    Sinus rhythm with PVCs - Personally  Reviewed  ECG   No new tracing.  Physical Exam   GEN: No acute distress.   Neck: No JVD Cardiac: RRR, no murmurs, rubs, or gallops.  Respiratory:  Coarse breath sounds bilaterally with scattered rhonchi. GI: Soft, nontender, non-distended  MS: No edema; No deformity. Neuro:  Nonfocal  Psych: Normal affect   Labs    High Sensitivity Troponin:   Recent Labs  Lab 06/08/20 0344 06/08/20 0602  TROPONINIHS 31* 25*      Chemistry Recent Labs  Lab 06/08/20 0344 06/08/20 0921 06/09/20 0534  NA 139 137 136  K 3.4* 3.5 4.1  CL 104 101 99  CO2 26 28 30   GLUCOSE 126* 127* 99  BUN 6* 7* 9  CREATININE 0.92 0.81 0.72  CALCIUM 8.7* 8.7* 8.7*  PROT 5.7* 5.9*  --   ALBUMIN 3.3* 3.4*  --   AST 21 19  --   ALT 15 17  --   ALKPHOS 53 60  --   BILITOT 0.8 0.9  --   GFRNONAA >60 >60 >60  GFRAA >60 >60 >60  ANIONGAP 9 8 7      Hematology  Recent Labs  Lab 06/08/20 0344 06/08/20 0921 06/09/20 0534  WBC 8.0 9.2 7.6  RBC 4.91 4.99 4.62  HGB 16.7 17.0 15.7  HCT 48.1 47.9 45.1  MCV 98.0 96.0 97.6  MCH 34.0 34.1* 34.0  MCHC 34.7 35.5 34.8  RDW 14.0 14.3 14.1  PLT 189 176 174    BNPNo results for input(s): BNP, PROBNP in the last 168 hours.   DDimer No results for input(s): DDIMER in the last 168 hours.   Radiology    CARDIAC CATHETERIZATION  Result Date: 06/10/2020 Conclusions: 1. Mild to moderate, nonobstructive coronary artery disease with up to 50% stenoses involving small D1 branch as well as mid RCA.  Systolic dysfunction is out of proportion to CAD, consistent with nonischemic cardiomyopathy. 2. Mildly elevated left and right heart filling pressures. 3. Low normal to mildly reduced Fick cardiac output. Recommendations: 1. Optimize goal-directed medical therapy for management of nonischemic cardiomyopathy.  Smoking, tobacco, and drug cessation encouraged, as cardiomyopathy certainly could be related to substance abuse. 2. Medical therapy and risk factor modification  to prevent progression of coronary artery disease. Yvonne Kendall, MD [facility]   ECHOCARDIOGRAM COMPLETE  Result Date: 06/08/2020    ECHOCARDIOGRAM REPORT   Patient Name:   Kurt Blankenship Date of Exam: 06/08/2020 Medical Rec #:  [id number]      Height:       70.0 in Accession #:    [id number]     Weight:       167.1 lb Date of Birth:  October 01, 1956       BSA:          1.934 m Patient Age:    63 years       BP:           137/85 mmHg Patient Gender: M              HR:           79 bpm. Exam Location:  [facility] Procedure: 2D Echo, Cardiac Doppler and Color Doppler Indications:     Syncope 780.2  History:         Patient has no prior history of Echocardiogram examinations.                  COPD; Risk Factors:Hypertension.  Sonographer:     Cristela Blue RDCS (AE) Referring Phys:  [id number] Lucile Shutters Diagnosing Phys: Cristal Deer Makana Feigel MD  Sonographer Comments: Technically challenging study due to limited acoustic windows and no apical window. Image acquisition challenging due to patient body habitus. IMPRESSIONS  1. Left ventricular ejection fraction, by estimation, is 20 to 25%. The left ventricle has severely decreased function. The left ventricle demonstrates global hypokinesis. Left ventricular diastolic function could not be evaluated.  2. Right ventricular systolic function is normal. The right ventricular size is mildly enlarged.  3. The mitral valve was not well visualized. No evidence of mitral valve regurgitation.  4. The aortic valve was not well visualized. Aortic valve regurgitation not well assessed.  5. The inferior vena cava is dilated in size with >50% respiratory variability, suggesting right atrial pressure of 8 mmHg. FINDINGS  Left Ventricle: Left ventricular ejection fraction, by estimation, is 20 to 25%. The left ventricle has severely decreased function. The left ventricle demonstrates global hypokinesis. The left ventricular internal cavity size was normal in size. There is no left ventricular  hypertrophy. Left ventricular diastolic function could not be evaluated. Right Ventricle: The right ventricular size is mildly enlarged. No  increase in right ventricular wall thickness. Right ventricular systolic function is normal. Left Atrium: Left atrial size was not well visualized. Right Atrium: Right atrial size was not well visualized. Pericardium: There is no evidence of pericardial effusion. Mitral Valve: The mitral valve was not well visualized. There is mild thickening of the mitral valve leaflet(s). There is mild calcification of the mitral valve leaflet(s). No evidence of mitral valve regurgitation. Tricuspid Valve: The tricuspid valve is not well visualized. Tricuspid valve regurgitation is not demonstrated. Aortic Valve: The aortic valve was not well visualized. Aortic valve regurgitation not well assessed. Pulmonic Valve: The pulmonic valve was not well visualized. Pulmonic valve regurgitation is not visualized. No evidence of pulmonic stenosis. Aorta: The aortic root is normal in size and structure. Pulmonary Artery: The pulmonary artery is not well seen. Venous: The inferior vena cava is dilated in size with greater than 50% respiratory variability, suggesting right atrial pressure of 8 mmHg. IAS/Shunts: No atrial level shunt detected by color flow Doppler.  LEFT VENTRICLE PLAX 2D LVIDd:         5.43 cm LVIDs:         5.08 cm LV PW:         0.87 cm LV IVS:        0.95 cm LVOT diam:     2.00 cm LVOT Area:     3.14 cm  LEFT ATRIUM         Index LA diam:    2.30 cm 1.19 cm/m                        PULMONIC VALVE AORTA                 PV Vmax:        0.56 m/s Ao Root diam: 3.30 cm PV Peak grad:   1.3 mmHg                       RVOT Peak grad: 2 mmHg   SHUNTS Systemic Diam: 2.00 cm Nelva Bush MD Electronically signed by Nelva Bush MD Signature Date/Time: 06/08/2020/1:27:40 PM    Final     Cardiac Studies   See cath and echo as above  Patient Profile     64 y.o. male history of  hypertension and polysubstance abuse, admitted with mechanical fall and loss of consciousness but incidentally found to have severely reduced LVEF.  Assessment & Plan    Nonischemic cardiomyopathy: Mr. Scharnhorst reports some shortness of breath this morning improved with DuoNeb, most consistent with obstructive lung disease.  Right and left heart catheterization notable for nonobstructive CAD and mildly elevated left and right heart filling pressures.  I suspect his cardiomyopathy is due to substance abuse, particularly alcohol use worse than what the patient reports.  Concurrent tobacco and cocaine use likely exacerbate the problem.  Continue current doses of carvedilol and losartan.  Start furosemide 20 mg p.o. p.o. daily tomorrow.  I do not recommend LifeVest placement, as nature of fall and loss of consciousness is not consistent with a cardiogenic cause.  I also worry in the setting of polysubstance abuse and questionable compliance, that risks of LifeVest use may outweigh benefits.  QT prolongation: QT improved but still slightly prolonged on most recent EKG from 6/23.  Repeat EKG today.  Replete potassium and magnesium to maintain levels greater than 4.0 and 2.0, respectively.  Avoid QT prolonging medications, including antiemetics such as ondansetron.  COPD: Likely driving dyspnea this morning.  Ongoing management per internal medicine.  Hypertension: Blood pressure reasonably well controlled.  Continue current doses of losartan and carvedilol.  Fall: Mechanical with trauma to the scalp and left shoulder.  Per internal medicine.  Polysubstance abuse: Patient found to have elevated blood alcohol level on admission as well as being cocaine positive.  He also smokes tobacco.  Abstinence from alcohol, drugs, tobacco strongly encouraged.  [facility] will sign off.   Medication Recommendations: Continue current doses of carvedilol and losartan; start furosemide 20 mg p.o.  daily tomorrow if renal function and electrolytes are stable.  Avoid QT prolonging medications. Other recommendations (labs, testing, etc): None. Follow up as an outpatient: Follow-up with APP in our office in 1 to 2 weeks.  For questions or updates, please contact [facility] Please consult [email or web address] for contact info under [facility].     Signed, Yvonne Kendall, MD  06/10/2020, 8:58 AM

## 2020-06-10 NOTE — OR Nursing (Signed)
Pt verbally aggressive, requesting pain medication. Oxycodone due at 10 am, tremor noted on monitor. Versed 2 mg IV given.

## 2020-06-10 NOTE — Plan of Care (Signed)
  Problem: Education: Goal: Knowledge of General Education information will improve Description: Including pain rating scale, medication(s)/side effects and non-pharmacologic comfort measures Outcome: Progressing   Problem: Health Behavior/Discharge Planning: Goal: Ability to manage health-related needs will improve Outcome: Progressing   Problem: Clinical Measurements: Goal: Ability to maintain clinical measurements within normal limits will improve Outcome: Progressing Goal: Will remain free from infection Outcome: Progressing Goal: Diagnostic test results will improve Outcome: Progressing Goal: Respiratory complications will improve Outcome: Progressing Goal: Cardiovascular complication will be avoided Outcome: Progressing   Problem: Pain Managment: Goal: General experience of comfort will improve Outcome: Progressing   Problem: Safety: Goal: Ability to remain free from injury will improve Outcome: Progressing   

## 2020-06-10 NOTE — Discharge Summary (Signed)
Physician Discharge Summary  Kurt Blankenship ZOX:[id number] DOB: 15-Mar-1956 DOA: 06/08/2020  PCP: Marshell Garfinkel, MD  Admit date: 06/08/2020 Discharge date: 06/10/2020  Admitted From: Home  Disposition:  Home   Recommendations for Outpatient Follow-up and new medication changes:  1. Follow up with Dr, Loa Socks in 7 days.  2. Patient placed on heart failure regimen with carvedilol and losartan.  3. Diuretic therapy with furosemide and K supplements.  4. Remove scalp staples in 7 days.   Home Health: no   Equipment/Devices: no   Discharge Condition: stable  CODE STATUS: full  Diet recommendation: heart healthy low sodium  Brief/Interim Summary: Patient admitted to the hospital with a working diagnosis of syncope.  64 year old male with past medical history for hypertension and COPD.  He was brought to the hospital after syncope episode.  He reported standing, and going to the bathroom when his knees gave out and he fell backwards.  His family heard him falling, he was found unresponsive and EMS was called.  He denied any frank prodromal symptoms.  He drinks alcohol every day.  When EMS arrived patient had another syncope episode while getting into the stretcher, his blood pressure then was 88/62.  He received 500 cc of intravenous fluids and was transported to the hospital.  In the emergency department his blood pressure was 107/64, heart rate 84, respiratory rate 23, temperature 97.4, oxygen saturation 93%.  He had moist mucous membranes, his lungs were clear to auscultation bilaterally, heart S1-S2, present and rhythmic, his abdomen was soft, no lower extremity edema, he was neurologically nonfocal.  Sodium 139, potassium 3.4, chloride 104, bicarb 26, glucose 126, BUN 6, creatinine 0.92, troponin I 31, white count 8.0, hemoglobin 16.7, hematocrit 48.1, platelets 189. CT head with left parietal scalp laceration and contusive changes.  Trace crescentic hematoma measuring up to 3 mm in thickness.  No  acute intracranial abnormalities.  Multilevels monolithic and facet arthropathy of the cervical spine.  Moderate to severe bilateral foraminal narrowing, C5-C7.  SARS COVID-19 negative.  Toxicology screen positive for cocaine, alcohol level 114. EKG 81 bpm, normal axis, QTC 500, sinus rhythm, no significant ST segment or T wave changes.  1.  Orthostatic syncope/ complicated with head trauma.  Patient was admitted to the medical ward, he was placed on the remote telemetry monitor.  Patient received gentle hydration with isotonic saline with improvement of his symptoms.  His discharge blood pressure 126/65, no further orthostatic symptoms.  Patient had staples placed at left parietal scalp laceration.  2.  Newly diagnosed systolic heart failure, nonischemic cardiomyopathy without decompensation. Patient underwent further work-up with echocardiography which showed ejection fraction 20 to 25% on the left ventricle.  Positive severe LV global hypokinesis.  No significant valvular disease.  Patient was seen by cardiology and underwent cardiac catheterization which showed mild to moderate nonobstructive coronary disease up to 50% stenosis involving small D1 branch as well as mid RCA.  Systolic dysfunction out of proportion of coronary disease consistent with nonischemic cardiomyopathy.  Patient has been placed on carvedilol and losartan, diuretic therapy with furosemide.  Follow-up as an outpatient.  3.  Prolonged QTc.  Manually corrected QTC 500 ms, his electrolytes were corrected, target potassium of 4, magnesium of 2.   Recommendations to avoid QT prolonging medications and follow-up EKG as an outpatient.  4.  Alcohol abuse, cocaine abuse.  No signs of acute alcohol withdrawal.  Patient received lorazepam as needed per CIWA protocol.  He was advised to avoid cocaine  and alcohol.  5.  COPD.  No signs of acute exacerbation, resume bronchodilator therapy as needed.  6. Dyslipidemia. Continue with  atorvastatin.   Discharge Diagnoses:  Principal Problem:   Syncope and collapse Active Problems:   COPD (chronic obstructive pulmonary disease) (HCC)   Syncope   Hypertension   Hypokalemia   Cardiomyopathy ([employer])    Discharge Instructions   Allergies as of 06/10/2020   No Known Allergies     Medication List    STOP taking these medications   chlorthalidone 25 MG tablet Commonly known as: HYGROTON   doxycycline 100 MG tablet Commonly known as: VIBRA-TABS   lisinopril 20 MG tablet Commonly known as: ZESTRIL   predniSONE 10 MG (21) Tbpk tablet Commonly known as: STERAPRED UNI-PAK 21 TAB   predniSONE 10 MG tablet Commonly known as: DELTASONE     TAKE these medications   albuterol 108 (90 Base) MCG/ACT inhaler Commonly known as: VENTOLIN HFA Inhale 2 puffs into the lungs every 4 (four) hours as needed for wheezing or shortness of breath. What changed: Another medication with the same name was removed. Continue taking this medication, and follow the directions you see here.   atorvastatin 20 MG tablet Commonly known as: LIPITOR Take 1 tablet by mouth daily.   budesonide-formoterol 160-4.5 MCG/ACT inhaler Commonly known as: SYMBICORT Inhale 2 puffs into the lungs 2 (two) times daily.   carvedilol 3.125 MG tablet Commonly known as: COREG Take 1 tablet (3.125 mg total) by mouth 2 (two) times daily with a meal.   furosemide 20 MG tablet Commonly known as: LASIX Take 1 tablet (20 mg total) by mouth daily. Start on 06/11/20 Start taking on: June 11, 2020   gabapentin 300 MG capsule Commonly known as: NEURONTIN Take 1 capsule by mouth in the morning and at bedtime.   losartan 25 MG tablet Commonly known as: COZAAR Take 1 tablet (25 mg total) by mouth daily. Start taking on: June 11, 2020   meclizine 12.5 MG tablet Commonly known as: ANTIVERT Take 1 tablet by mouth 3 (three) times daily as needed for dizziness.   Mitigare 0.6 MG Caps Generic drug:  Colchicine Take 1 capsule by mouth daily as needed.   Oxycodone HCl 10 MG Tabs Take 10 mg by mouth every 6 (six) hours.   potassium chloride SA 20 MEQ tablet Commonly known as: KLOR-CON Take 1 tablet (20 mEq total) by mouth daily. Start taking on: June 11, 2020   traZODone 50 MG tablet Commonly known as: DESYREL Take 1 tablet by mouth at bedtime.       Follow-up Information    Ro, Charlynne Cousins, MD In 1 week.   Specialty: Family Medicine Contact information: [address] [phone]              No Known Allergies  Consultations:  Cardiology    Procedures/Studies: CT Head Wo Contrast  Result Date: 06/08/2020 CLINICAL DATA:  [place] backwards with posterior laceration EXAM: CT HEAD WITHOUT CONTRAST CT CERVICAL SPINE WITHOUT CONTRAST TECHNIQUE: Multidetector CT imaging of the head and cervical spine was performed following the standard protocol without intravenous contrast. Multiplanar CT image reconstructions of the cervical spine were also generated. COMPARISON:  CT head and cervical spine 05/07/2020 FINDINGS: CT HEAD FINDINGS Brain: No evidence of acute infarction, hemorrhage, hydrocephalus, extra-axial collection or mass lesion/mass effect. Symmetric prominence of the ventricles, cisterns and sulci compatible with parenchymal volume loss. Patchy areas of white matter hypoattenuation are most  compatible with chronic microvascular angiopathy. Vascular: Atherosclerotic calcification of the carotid siphons. No hyperdense vessel. Skull: High left parietal scalp laceration and contusive change with trace crescentic hematoma measuring up to 3 mm in maximal thickness (axial image 2/23). No subjacent calvarial fracture, sutural diastasis or other acute traumatic osseous abnormality. No worrisome osseous lesions. Plate screw fixation of the superolateral right orbit without acute complication. Sinuses/Orbits: Paranasal sinuses and mastoid air cells are  predominantly clear. Pneumatization of the petrous apices. Included orbital structures are unremarkable. Other: None. CT CERVICAL SPINE FINDINGS Alignment: Stabilization collar in place. Straightening of the normal cervical lordosis possibly related to this device. Mild anterolisthesis at C2-3 and C4-5 is unchanged from comparison sin favored to be on a degenerative basis. Minimal left lateral neck flexion. No evidence of traumatic listhesis. Bony fusion of the left C2-3 facet. Some asymmetric degenerative changes at the left C4-5 facet resulting in some mild widening. No other abnormally widened, perched or jumped facets. Normal alignment of the craniocervical and atlantoaxial articulations. Skull base and vertebrae: No acute vertebral body fracture or height loss. No visible skull base fractures. Mild sclerosis in the dens, likely degenerative and unchanged from prior. Stable sclerotic focus in the T2 spinous process. Bony fusion the left C2-3 facets, as above. Additional multilevel spondylitic changes and facet arthropathy, further detailed below. Soft tissues and spinal canal: No pre or paravertebral fluid or swelling. No visible canal hematoma. Enthesopathic mineralization in the nuchal ligament. Disc levels: Multilevel intervertebral disc height loss with spondylitic endplate changes. Posterior disc osteophyte complexes efface the ventral thecal sac most pronounced at C5-6 but without significant canal stenosis. Uncinate spurring and facet hypertrophic changes are present throughout the cervical spine with mild multilevel neural foraminal narrowing. More moderate to severe foraminal stenoses present at C 5-6, C6-7 bilaterally. Upper chest: Apical pleuroparenchymal scarring and emphysematous changes. Cervical carotid atherosclerosis. No acute abnormality in the upper chest or imaged lung apices. Small amount of gas in the right jugular vein, likely related to intravenous access. Other: No worrisome thyroid  nodules. IMPRESSION: 1. High left parietal scalp laceration and contusive change with trace crescentic hematoma measuring up to 3 mm in maximal thickness. No subjacent calvarial fracture, sutural diastasis or other acute traumatic osseous abnormality. 2. No acute intracranial abnormality. Background of chronic mild parenchymal volume loss and microvascular angiopathy. 3. No evidence of acute fracture or traumatic listhesis of the cervical spine. 4. Bony fusion of the left C2-3 facet. 5. 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 No significant canal stenosis. Moderate to severe bilateral foraminal narrowing C5-C7. 6. Cervical and intracranial atherosclerosis. 7. Emphysema (ICD10-J43.9). Electronically Signed   By: Kreg Shropshire M.D.   On: 06/08/2020 04:59   CT Cervical Spine Wo Contrast  Result Date: 06/08/2020 CLINICAL DATA:  [place] backwards with posterior laceration EXAM: CT HEAD WITHOUT CONTRAST CT CERVICAL SPINE WITHOUT CONTRAST TECHNIQUE: Multidetector CT imaging of the head and cervical spine was performed following the standard protocol without intravenous contrast. Multiplanar CT image reconstructions of the cervical spine were also generated. COMPARISON:  CT head and cervical spine 05/07/2020 FINDINGS: CT HEAD FINDINGS Brain: No evidence of acute infarction, hemorrhage, hydrocephalus, extra-axial collection or mass lesion/mass effect. Symmetric prominence of the ventricles, cisterns and sulci compatible with parenchymal volume loss. Patchy areas of white matter hypoattenuation are most compatible with chronic microvascular angiopathy. Vascular: Atherosclerotic calcification of the carotid siphons. No hyperdense vessel. Skull: High left parietal scalp laceration and contusive change with trace crescentic  hematoma measuring up to 3 mm in maximal thickness (axial image 2/23). No subjacent calvarial fracture, sutural diastasis or other acute traumatic osseous  abnormality. No worrisome osseous lesions. Plate screw fixation of the superolateral right orbit without acute complication. Sinuses/Orbits: Paranasal sinuses and mastoid air cells are predominantly clear. Pneumatization of the petrous apices. Included orbital structures are unremarkable. Other: None. CT CERVICAL SPINE FINDINGS Alignment: Stabilization collar in place. Straightening of the normal cervical lordosis possibly related to this device. Mild anterolisthesis at C2-3 and C4-5 is unchanged from comparison sin favored to be on a degenerative basis. Minimal left lateral neck flexion. No evidence of traumatic listhesis. Bony fusion of the left C2-3 facet. Some asymmetric degenerative changes at the left C4-5 facet resulting in some mild widening. No other abnormally widened, perched or jumped facets. Normal alignment of the craniocervical and atlantoaxial articulations. Skull base and vertebrae: No acute vertebral body fracture or height loss. No visible skull base fractures. Mild sclerosis in the dens, likely degenerative and unchanged from prior. Stable sclerotic focus in the T2 spinous process. Bony fusion the left C2-3 facets, as above. Additional multilevel spondylitic changes and facet arthropathy, further detailed below. Soft tissues and spinal canal: No pre or paravertebral fluid or swelling. No visible canal hematoma. Enthesopathic mineralization in the nuchal ligament. Disc levels: Multilevel intervertebral disc height loss with spondylitic endplate changes. Posterior disc osteophyte complexes efface the ventral thecal sac most pronounced at C5-6 but without significant canal stenosis. Uncinate spurring and facet hypertrophic changes are present throughout the cervical spine with mild multilevel neural foraminal narrowing. More moderate to severe foraminal stenoses present at C 5-6, C6-7 bilaterally. Upper chest: Apical pleuroparenchymal scarring and emphysematous changes. Cervical carotid  atherosclerosis. No acute abnormality in the upper chest or imaged lung apices. Small amount of gas in the right jugular vein, likely related to intravenous access. Other: No worrisome thyroid nodules. IMPRESSION: 1. High left parietal scalp laceration and contusive change with trace crescentic hematoma measuring up to 3 mm in maximal thickness. No subjacent calvarial fracture, sutural diastasis or other acute traumatic osseous abnormality. 2. No acute intracranial abnormality. Background of chronic mild parenchymal volume loss and microvascular angiopathy. 3. No evidence of acute fracture or traumatic listhesis of the cervical spine. 4. Bony fusion of the left C2-3 facet. 5. 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 No significant canal stenosis. Moderate to severe bilateral foraminal narrowing C5-C7. 6. Cervical and intracranial atherosclerosis. 7. Emphysema (ICD10-J43.9). Electronically Signed   By: Kreg Shropshire M.D.   On: 06/08/2020 04:59   CARDIAC CATHETERIZATION  Result Date: 06/10/2020 Conclusions: 1. Mild to moderate, nonobstructive coronary artery disease with up to 50% stenoses involving small D1 branch as well as mid RCA.  Systolic dysfunction is out of proportion to CAD, consistent with nonischemic cardiomyopathy. 2. Mildly elevated left and right heart filling pressures. 3. Low normal to mildly reduced Fick cardiac output. Recommendations: 1. Optimize goal-directed medical therapy for management of nonischemic cardiomyopathy.  Smoking, tobacco, and drug cessation encouraged, as cardiomyopathy certainly could be related to substance abuse. 2. Medical therapy and risk factor modification to prevent progression of coronary artery disease. Yvonne Kendall, MD [facility]   ECHOCARDIOGRAM COMPLETE  Result Date: 06/08/2020    ECHOCARDIOGRAM REPORT   Patient Name:   KASHIS VANA Date of Exam: 06/08/2020 Medical Rec #:  [id number]      Height:       70.0  in Accession #:    [id number]  Weight:       167.1 lb Date of Birth:  07-14-1956       BSA:          1.934 m Patient Age:    63 years       BP:           137/85 mmHg Patient Gender: M              HR:           79 bpm. Exam Location:  [facility] Procedure: 2D Echo, Cardiac Doppler and Color Doppler Indications:     Syncope 780.2  History:         Patient has no prior history of Echocardiogram examinations.                  COPD; Risk Factors:Hypertension.  Sonographer:     Cristela Blue RDCS (AE) Referring Phys:  [id number] Lucile Shutters Diagnosing Phys: Cristal Deer End MD  Sonographer Comments: Technically challenging study due to limited acoustic windows and no apical window. Image acquisition challenging due to patient body habitus. IMPRESSIONS  1. Left ventricular ejection fraction, by estimation, is 20 to 25%. The left ventricle has severely decreased function. The left ventricle demonstrates global hypokinesis. Left ventricular diastolic function could not be evaluated.  2. Right ventricular systolic function is normal. The right ventricular size is mildly enlarged.  3. The mitral valve was not well visualized. No evidence of mitral valve regurgitation.  4. The aortic valve was not well visualized. Aortic valve regurgitation not well assessed.  5. The inferior vena cava is dilated in size with >50% respiratory variability, suggesting right atrial pressure of 8 mmHg. FINDINGS  Left Ventricle: Left ventricular ejection fraction, by estimation, is 20 to 25%. The left ventricle has severely decreased function. The left ventricle demonstrates global hypokinesis. The left ventricular internal cavity size was normal in size. There is no left ventricular hypertrophy. Left ventricular diastolic function could not be evaluated. Right Ventricle: The right ventricular size is mildly enlarged. No increase in right ventricular wall thickness. Right ventricular systolic function is normal. Left Atrium: Left atrial size was not well  visualized. Right Atrium: Right atrial size was not well visualized. Pericardium: There is no evidence of pericardial effusion. Mitral Valve: The mitral valve was not well visualized. There is mild thickening of the mitral valve leaflet(s). There is mild calcification of the mitral valve leaflet(s). No evidence of mitral valve regurgitation. Tricuspid Valve: The tricuspid valve is not well visualized. Tricuspid valve regurgitation is not demonstrated. Aortic Valve: The aortic valve was not well visualized. Aortic valve regurgitation not well assessed. Pulmonic Valve: The pulmonic valve was not well visualized. Pulmonic valve regurgitation is not visualized. No evidence of pulmonic stenosis. Aorta: The aortic root is normal in size and structure. Pulmonary Artery: The pulmonary artery is not well seen. Venous: The inferior vena cava is dilated in size with greater than 50% respiratory variability, suggesting right atrial pressure of 8 mmHg. IAS/Shunts: No atrial level shunt detected by color flow Doppler.  LEFT VENTRICLE PLAX 2D LVIDd:         5.43 cm LVIDs:         5.08 cm LV PW:         0.87 cm LV IVS:        0.95 cm LVOT diam:     2.00 cm LVOT Area:     3.14 cm  LEFT ATRIUM         Index  LA diam:    2.30 cm 1.19 cm/m                        PULMONIC VALVE AORTA                 PV Vmax:        0.56 m/s Ao Root diam: 3.30 cm PV Peak grad:   1.3 mmHg                       RVOT Peak grad: 2 mmHg   SHUNTS Systemic Diam: 2.00 cm Yvonne Kendall MD Electronically signed by Yvonne Kendall MD Signature Date/Time: 06/08/2020/1:27:40 PM    Final       Procedures: cardiac catheterization   Subjective: Patient is feeling well. No further syncope episodes, no nausea or vomiting, no dyspnea or chest pain. Mild pain on right shoulder.   Discharge Exam: Vitals:   06/10/20 1030 06/10/20 1126  BP: (!) 116/91 126/65  Pulse: 72 77  Resp: 10 18  Temp:  98.3 F (36.8 C)  SpO2: 94% 91%   Vitals:   06/10/20 0950  06/10/20 1000 06/10/20 1030 06/10/20 1126  BP: 131/69 124/75 (!) 116/91 126/65  Pulse: 68 69 72 77  Resp: (!) 9 (!) 7 10 18   Temp:    98.3 F (36.8 C)  TempSrc:    Oral  SpO2: 90% 91% 94% 91%  Weight:      Height:        General: Not in pain or dyspnea.  Neurology: Awake and alert, non focal  E ENT: no pallor, no icterus, oral mucosa moist Cardiovascular: No JVD. S1-S2 present, rhythmic, no gallops, rubs, or murmurs. No lower extremity edema. Pulmonary: positive breath sounds bilaterally, adequate air movement, no wheezing, rhonchi or rales. Gastrointestinal. Abdomen with no organomegaly, non tender, no rebound or guarding Skin. No rashes Musculoskeletal: no joint deformities   The results of significant diagnostics from this hospitalization (including imaging, microbiology, ancillary and laboratory) are listed below for reference.     Microbiology: Recent Results (from the past 240 hour(s))  SARS Coronavirus 2 by RT PCR (hospital order, performed in [facility] hospital lab) Nasopharyngeal Nasopharyngeal Swab     Status: None   Collection Time: 06/08/20  9:21 AM   Specimen: Nasopharyngeal Swab  Result Value Ref Range Status   SARS Coronavirus 2 NEGATIVE NEGATIVE Final    Comment: (NOTE) SARS-CoV-2 target nucleic acids are NOT DETECTED.  The SARS-CoV-2 RNA is generally detectable in upper and lower respiratory specimens during the acute phase of infection. The lowest concentration of SARS-CoV-2 viral copies this assay can detect is 250 copies / mL. A negative result does not preclude SARS-CoV-2 infection and should not be used as the sole basis for treatment or other patient management decisions.  A negative result may occur with improper specimen collection / handling, submission of specimen other than nasopharyngeal swab, presence of viral mutation(s) within the areas targeted by this assay, and inadequate number of viral copies (<250 copies / mL). A negative result must  be combined with clinical observations, patient history, and epidemiological information.  Fact Sheet for Patients:   06/10/20  Fact Sheet for Healthcare Providers: BoilerBrush.com.cy  This test is not yet approved or  cleared by the https://pope.com/ FDA and has been authorized for detection and/or diagnosis of SARS-CoV-2 by FDA under an Emergency Use Authorization (EUA).  This EUA will remain in effect (meaning  this test can be used) for the duration of the COVID-19 declaration under Section 564(b)(1) of the Act, 21 U.S.C. section 360bbb-3(b)(1), unless the authorization is terminated or revoked sooner.  Performed at [facility], [address] [place], [place] [address]      Labs: BNP (last 3 results) Recent Labs    05/07/20 0629  BNP 254.9*   Basic Metabolic Panel: Recent Labs  Lab 06/08/20 0344 06/08/20 0921 06/09/20 0534  NA 139 137 136  K 3.4* 3.5 4.1  CL 104 101 99  CO2 26 28 30   GLUCOSE 126* 127* 99  BUN 6* 7* 9  CREATININE 0.92 0.81 0.72  CALCIUM 8.7* 8.7* 8.7*  MG  --  1.7 1.8  PHOS  --  3.7  --    Liver Function Tests: Recent Labs  Lab 06/08/20 0344 06/08/20 0921  AST 21 19  ALT 15 17  ALKPHOS 53 60  BILITOT 0.8 0.9  PROT 5.7* 5.9*  ALBUMIN 3.3* 3.4*   No results for input(s): LIPASE, AMYLASE in the last 168 hours. No results for input(s): AMMONIA in the last 168 hours. CBC: Recent Labs  Lab 06/08/20 0344 06/08/20 0921 06/09/20 0534  WBC 8.0 9.2 7.6  HGB 16.7 17.0 15.7  HCT 48.1 47.9 45.1  MCV 98.0 96.0 97.6  PLT 189 176 174   Cardiac Enzymes: No results for input(s): CKTOTAL, CKMB, CKMBINDEX, TROPONINI in the last 168 hours. BNP: Invalid input(s): POCBNP CBG: Recent Labs  Lab 06/09/20 0650  GLUCAP 108*   D-Dimer No results for input(s): DDIMER in the last 72 hours. Hgb A1c No results for input(s): HGBA1C in the last 72 hours. Lipid Profile No results for  input(s): CHOL, HDL, LDLCALC, TRIG, CHOLHDL, LDLDIRECT in the last 72 hours. Thyroid function studies No results for input(s): TSH, T4TOTAL, T3FREE, THYROIDAB in the last 72 hours.  Invalid input(s): FREET3 Anemia work up No results for input(s): VITAMINB12, FOLATE, FERRITIN, TIBC, IRON, RETICCTPCT in the last 72 hours. Urinalysis    Component Value Date/Time   COLORURINE AMBER (A) 02/25/2019 1203   APPEARANCEUR CLEAR (A) 02/25/2019 1203   APPEARANCEUR Clear 11/09/2012 1801   LABSPEC 1.029 02/25/2019 1203   LABSPEC 1.005 11/09/2012 1801   PHURINE 6.0 02/25/2019 1203   GLUCOSEU NEGATIVE 02/25/2019 1203   GLUCOSEU Negative 11/09/2012 1801   HGBUR NEGATIVE 02/25/2019 1203   BILIRUBINUR NEGATIVE 02/25/2019 1203   BILIRUBINUR Negative 11/09/2012 1801   KETONESUR 20 (A) 02/25/2019 1203   PROTEINUR 100 (A) 02/25/2019 1203   NITRITE NEGATIVE 02/25/2019 1203   LEUKOCYTESUR NEGATIVE 02/25/2019 1203   LEUKOCYTESUR Negative 11/09/2012 1801   Sepsis Labs Invalid input(s): PROCALCITONIN,  WBC,  LACTICIDVEN Microbiology Recent Results (from the past 240 hour(s))  SARS Coronavirus 2 by RT PCR (hospital order, performed in [facility] hospital lab) Nasopharyngeal Nasopharyngeal Swab     Status: None   Collection Time: 06/08/20  9:21 AM   Specimen: Nasopharyngeal Swab  Result Value Ref Range Status   SARS Coronavirus 2 NEGATIVE NEGATIVE Final    Comment: (NOTE) SARS-CoV-2 target nucleic acids are NOT DETECTED.  The SARS-CoV-2 RNA is generally detectable in upper and lower respiratory specimens during the acute phase of infection. The lowest concentration of SARS-CoV-2 viral copies this assay can detect is 250 copies / mL. A negative result does not preclude SARS-CoV-2 infection and should not be used as the sole basis for treatment or other patient management decisions.  A negative result may occur with improper specimen collection /  handling, submission of specimen other than  nasopharyngeal swab, presence of viral mutation(s) within the areas targeted by this assay, and inadequate number of viral copies (<250 copies / mL). A negative result must be combined with clinical observations, patient history, and epidemiological information.  Fact Sheet for Patients:   BoilerBrush.com.cy  Fact Sheet for Healthcare Providers: https://pope.com/  This test is not yet approved or  cleared by the [place] FDA and has been authorized for detection and/or diagnosis of SARS-CoV-2 by FDA under an Emergency Use Authorization (EUA).  This EUA will remain in effect (meaning this test can be used) for the duration of the COVID-19 declaration under Section 564(b)(1) of the Act, 21 U.S.C. section 360bbb-3(b)(1), unless the authorization is terminated or revoked sooner.  Performed at [facility], [address], [place], [place] [address]      Time coordinating discharge: 45 minutes  SIGNED:   Coralie Keens, MD  [facility] 06/10/2020, 12:54 PM

## 2020-06-10 NOTE — Interval H&P Note (Signed)
History and Physical Interval Note:  06/10/2020 7:53 AM  Kurt Blankenship  has presented today for surgery, with the diagnosis of cardiomyopathy.  The various methods of treatment have been discussed with the patient and family. After consideration of risks, benefits and other options for treatment, the patient has consented to  Procedure(s): RIGHT/LEFT HEART CATH AND CORONARY ANGIOGRAPHY (N/A) as a surgical intervention.  The patient's history has been reviewed, patient examined, no change in status, stable for surgery.  I have reviewed the patient's chart and labs.  Questions were answered to the patient's satisfaction.    Cath Lab Visit (complete for each Cath Lab visit)  Clinical Evaluation Leading to the Procedure:   ACS: No.  Non-ACS:    Anginal Classification: No Symptoms (fall with questionable syncope)  Anti-ischemic medical therapy: No Therapy  Non-Invasive Test Results: No non-invasive testing performed (LVEF 20-25% - high risk)  Prior CABG: No previous CABG  [name]

## 2020-06-10 NOTE — Progress Notes (Signed)
Pt discharged from via private vehicle. Discharge instructions explained and given to pt. No further questions at this time. No s/s of distress noted. IV and tele box removed.

## 2020-06-14 ENCOUNTER — Other Ambulatory Visit: Payer: Medicare PPO

## 2020-06-23 ENCOUNTER — Ambulatory Visit: Payer: Medicare PPO | Admitting: Family

## 2020-06-23 NOTE — Progress Notes (Deleted)
Office Visit    Patient Name: Kurt Blankenship Date of Encounter: 06/23/2020  Primary Care Provider:  Marshell Garfinkel, MD Primary Cardiologist:  Yvonne Kendall, MD Electrophysiologist:  None   Chief Complaint    Kurt Blankenship is a 64 y.o. male with a hx of HTN, tobacco abuse, COPD, alcohol use, chronic low back pain requiring narcotics presents today for hospital follow-up.   Family history notable for coronary disease with a brother diagnosed 30s of MI.  His mother died in her 31s with likely heart failure per his description.  06/08/20 he was walking in his kitchen and sometime around midnight his knee locked up on him.  He stumbled and fell backwards.  He lost consciousness and EMS was called.  EKG in ED notable for SR 81 bpm with nonspecific T changes and prolonged QT at 449 ms the setting of mild hypokalemia (3.5), hypomagnesium (1.7), hypocalcemia (8.7).  Initial high-sensitivity troponin elevated at 90 with follow-ups of 31 and 25.  Head CT notable for left parietal scalp laceration but otherwise without acute injury.  He was noted to be intoxicated at time of admission.  Echo 06/08/2020 LVEF 20-25% with global hypokinesis, RV mildly enlarged.  Subsequent left and right heart cath 06/10/2020 with mild to moderate nonobstructive CAD with up to 50% stenoses involving small D1 branch as well as mid RCA.  Systolic dysfunction out of proportion to CAD consistent with nonischemic cardiomyopathy.  Mildly elevated left and right heart filling pressures.  Past Medical History    Past Medical History:  Diagnosis Date  . Alcohol use   . COPD (chronic obstructive pulmonary disease) (HCC)   . Degenerative joint disease    a. S/p bilateral knee replacements.  . Hypertension   . Low back pain   . Tobacco abuse    Past Surgical History:  Procedure Laterality Date  . Bilateral knee replacements    . RIGHT/LEFT HEART CATH AND CORONARY ANGIOGRAPHY N/A 06/10/2020   Procedure: RIGHT/LEFT HEART CATH  AND CORONARY ANGIOGRAPHY;  Surgeon: Yvonne Kendall, MD;  Location: [facility] INVASIVE CV LAB;  Service: Cardiovascular;  Laterality: N/A;    Allergies  No Known Allergies  History of Present Illness    Kurt Blankenship is a 64 y.o. male with a hx of *** last seen while hospitalized.  EKGs/Labs/Other Studies Reviewed:   The following studies were reviewed today: ***  EKG:  EKG is *** ordered today.  The ekg ordered today demonstrates ***  Recent Labs: 05/07/2020: B Natriuretic Peptide 254.9 06/08/2020: ALT 17 06/09/2020: BUN 9; Creatinine, Ser 0.72; Hemoglobin 15.7; Magnesium 1.8; Platelets 174; Potassium 4.1; Sodium 136  Recent Lipid Panel No results found for: CHOL, TRIG, HDL, CHOLHDL, VLDL, LDLCALC, LDLDIRECT  Home Medications   No outpatient medications have been marked as taking for the 06/23/20 encounter (Appointment) with Alver Sorrow, NP.      Review of Systems    ***   ROS All other systems reviewed and are otherwise negative except as noted above.  Physical Exam    VS:  There were no vitals taken for this visit. , BMI There is no height or weight on file to calculate BMI. GEN: Well nourished, well developed, in no acute distress. HEENT: normal. Neck: Supple, no JVD, carotid bruits, or masses. Cardiac: ***RRR, no murmurs, rubs, or gallops. No clubbing, cyanosis, edema.  ***Radials/DP/PT 2+ and equal bilaterally.  Respiratory:  ***Respirations regular and unlabored, clear to auscultation bilaterally. GI: Soft, nontender, nondistended, BS +  x 4. MS: No deformity or atrophy. Skin: Warm and dry, no rash. Neuro:  Strength and sensation are intact. Psych: Normal affect.  Assessment & Plan    1. *** 2. HTN -  3. Prolonged QT -  4. Tobacco abuse/COPD -  5. Alcohol use -   Disposition: Follow up {follow up:15908} with Dr. Okey Dupre or APP  Alver Sorrow, NP 06/23/2020, 9:41 AM

## 2020-10-05 ENCOUNTER — Emergency Department: Payer: Medicare HMO

## 2020-10-05 ENCOUNTER — Other Ambulatory Visit: Payer: Self-pay

## 2020-10-05 ENCOUNTER — Encounter: Payer: Self-pay | Admitting: Emergency Medicine

## 2020-10-05 ENCOUNTER — Inpatient Hospital Stay
Admission: EM | Admit: 2020-10-05 | Discharge: 2020-10-08 | DRG: 291 | Discharge status: Against Medical Advice | Payer: Medicare HMO | Attending: Internal Medicine | Admitting: Internal Medicine

## 2020-10-05 DIAGNOSIS — F1090 Alcohol use, unspecified, uncomplicated: Secondary | ICD-10-CM

## 2020-10-05 DIAGNOSIS — R109 Unspecified abdominal pain: Secondary | ICD-10-CM

## 2020-10-05 DIAGNOSIS — J441 Chronic obstructive pulmonary disease with (acute) exacerbation: Secondary | ICD-10-CM | POA: Diagnosis present

## 2020-10-05 DIAGNOSIS — F17210 Nicotine dependence, cigarettes, uncomplicated: Secondary | ICD-10-CM | POA: Diagnosis present

## 2020-10-05 DIAGNOSIS — Z789 Other specified health status: Secondary | ICD-10-CM

## 2020-10-05 DIAGNOSIS — E785 Hyperlipidemia, unspecified: Secondary | ICD-10-CM | POA: Diagnosis present

## 2020-10-05 DIAGNOSIS — Z9981 Dependence on supplemental oxygen: Secondary | ICD-10-CM

## 2020-10-05 DIAGNOSIS — R778 Other specified abnormalities of plasma proteins: Secondary | ICD-10-CM | POA: Diagnosis not present

## 2020-10-05 DIAGNOSIS — Z79899 Other long term (current) drug therapy: Secondary | ICD-10-CM | POA: Diagnosis not present

## 2020-10-05 DIAGNOSIS — R0602 Shortness of breath: Secondary | ICD-10-CM | POA: Diagnosis present

## 2020-10-05 DIAGNOSIS — J90 Pleural effusion, not elsewhere classified: Secondary | ICD-10-CM | POA: Diagnosis present

## 2020-10-05 DIAGNOSIS — Z96653 Presence of artificial knee joint, bilateral: Secondary | ICD-10-CM | POA: Diagnosis present

## 2020-10-05 DIAGNOSIS — J9621 Acute and chronic respiratory failure with hypoxia: Secondary | ICD-10-CM | POA: Diagnosis present

## 2020-10-05 DIAGNOSIS — I110 Hypertensive heart disease with heart failure: Secondary | ICD-10-CM | POA: Diagnosis present

## 2020-10-05 DIAGNOSIS — R7989 Other specified abnormal findings of blood chemistry: Secondary | ICD-10-CM | POA: Diagnosis present

## 2020-10-05 DIAGNOSIS — F1010 Alcohol abuse, uncomplicated: Secondary | ICD-10-CM | POA: Diagnosis present

## 2020-10-05 DIAGNOSIS — Z825 Family history of asthma and other chronic lower respiratory diseases: Secondary | ICD-10-CM | POA: Diagnosis not present

## 2020-10-05 DIAGNOSIS — Z20822 Contact with and (suspected) exposure to covid-19: Secondary | ICD-10-CM | POA: Diagnosis present

## 2020-10-05 DIAGNOSIS — Z8249 Family history of ischemic heart disease and other diseases of the circulatory system: Secondary | ICD-10-CM

## 2020-10-05 DIAGNOSIS — Z7951 Long term (current) use of inhaled steroids: Secondary | ICD-10-CM | POA: Diagnosis not present

## 2020-10-05 DIAGNOSIS — I5023 Acute on chronic systolic (congestive) heart failure: Secondary | ICD-10-CM | POA: Diagnosis present

## 2020-10-05 DIAGNOSIS — I10 Essential (primary) hypertension: Secondary | ICD-10-CM | POA: Diagnosis present

## 2020-10-05 DIAGNOSIS — I5021 Acute systolic (congestive) heart failure: Secondary | ICD-10-CM | POA: Diagnosis not present

## 2020-10-05 DIAGNOSIS — I248 Other forms of acute ischemic heart disease: Secondary | ICD-10-CM | POA: Diagnosis present

## 2020-10-05 DIAGNOSIS — Z720 Tobacco use: Secondary | ICD-10-CM | POA: Diagnosis present

## 2020-10-05 DIAGNOSIS — Z7289 Other problems related to lifestyle: Secondary | ICD-10-CM

## 2020-10-05 DIAGNOSIS — Z5329 Procedure and treatment not carried out because of patient's decision for other reasons: Secondary | ICD-10-CM | POA: Diagnosis present

## 2020-10-05 DIAGNOSIS — I509 Heart failure, unspecified: Secondary | ICD-10-CM

## 2020-10-05 HISTORY — DX: Heart failure, unspecified: I50.9

## 2020-10-05 LAB — BRAIN NATRIURETIC PEPTIDE: B Natriuretic Peptide: 1305.7 pg/mL — ABNORMAL HIGH (ref 0.0–100.0)

## 2020-10-05 LAB — TROPONIN I (HIGH SENSITIVITY)
Troponin I (High Sensitivity): 61 ng/L — ABNORMAL HIGH (ref <18)
Troponin I (High Sensitivity): 49 ng/L — ABNORMAL HIGH (ref <18)
Troponin I (High Sensitivity): 30 ng/L — ABNORMAL HIGH (ref <18)

## 2020-10-05 LAB — CBC
HCT: 45.2 % (ref 39.0–52.0)
Hemoglobin: 15.4 g/dL (ref 13.0–17.0)
MCH: 34.6 pg — ABNORMAL HIGH (ref 26.0–34.0)
MCHC: 34.1 g/dL (ref 30.0–36.0)
MCV: 101.6 fL — ABNORMAL HIGH (ref 80.0–100.0)
Platelets: 219 10*3/uL (ref 150–400)
RBC: 4.45 MIL/uL (ref 4.22–5.81)
RDW: 13.4 % (ref 11.5–15.5)
WBC: 9.2 10*3/uL (ref 4.0–10.5)
nRBC: 0 % (ref 0.0–0.2)

## 2020-10-05 LAB — RESPIRATORY PANEL BY RT PCR (FLU A&B, COVID)
Influenza A by PCR: NEGATIVE
Influenza B by PCR: NEGATIVE
SARS Coronavirus 2 by RT PCR: NEGATIVE

## 2020-10-05 LAB — BASIC METABOLIC PANEL
Anion gap: 10 (ref 5–15)
BUN: 15 mg/dL (ref 8–23)
CO2: 27 mmol/L (ref 22–32)
Calcium: 9.1 mg/dL (ref 8.9–10.3)
Chloride: 99 mmol/L (ref 98–111)
Creatinine, Ser: 0.75 mg/dL (ref 0.61–1.24)
GFR, Estimated: >60 mL/min (ref >60)
Glucose, Bld: 100 mg/dL — ABNORMAL HIGH (ref 70–99)
Potassium: 3.7 mmol/L (ref 3.5–5.1)
Sodium: 136 mmol/L (ref 135–145)

## 2020-10-05 LAB — PROTIME-INR
INR: 1.1 (ref 0.8–1.2)
Prothrombin Time: 14 seconds (ref 11.4–15.2)

## 2020-10-05 LAB — PROCALCITONIN: Procalcitonin: <0.1 ng/mL

## 2020-10-05 MED ORDER — FOLIC ACID 1 MG PO TABS
1.0000 mg | ORAL_TABLET | Freq: Every day | ORAL | Status: DC
  Administered 2020-10-05 – 2020-10-08 (×4): 1 mg via ORAL
  Filled 2020-10-05 (×5): qty 1

## 2020-10-05 MED ORDER — SUMATRIPTAN SUCCINATE 50 MG PO TABS
50.0000 mg | ORAL_TABLET | ORAL | Status: DC | PRN
  Filled 2020-10-05: qty 1

## 2020-10-05 MED ORDER — IPRATROPIUM-ALBUTEROL 0.5-2.5 (3) MG/3ML IN SOLN
3.0000 mL | RESPIRATORY_TRACT | Status: DC
  Administered 2020-10-05 – 2020-10-07 (×7): 3 mL via RESPIRATORY_TRACT
  Filled 2020-10-05 (×6): qty 3

## 2020-10-05 MED ORDER — SODIUM CHLORIDE 0.9 % IV SOLN: 250.0000 mL | INTRAVENOUS | Status: DC | PRN

## 2020-10-05 MED ORDER — FUROSEMIDE 10 MG/ML IJ SOLN
60.0000 mg | Freq: Once | INTRAMUSCULAR | Status: AC
  Administered 2020-10-05: 60 mg via INTRAVENOUS
  Filled 2020-10-05: qty 8

## 2020-10-05 MED ORDER — LORAZEPAM 2 MG/ML IJ SOLN
0.0000 mg | Freq: Four times a day (QID) | INTRAMUSCULAR | Status: AC
  Administered 2020-10-06: 2 mg via INTRAVENOUS
  Filled 2020-10-05: qty 1

## 2020-10-05 MED ORDER — OXYCODONE HCL 5 MG PO TABS
10.0000 mg | ORAL_TABLET | Freq: Four times a day (QID) | ORAL | Status: DC
  Administered 2020-10-05 – 2020-10-08 (×11): 10 mg via ORAL
  Filled 2020-10-05 (×11): qty 2

## 2020-10-05 MED ORDER — THIAMINE HCL 100 MG/ML IJ SOLN
100.0000 mg | Freq: Every day | INTRAMUSCULAR | Status: DC
  Filled 2020-10-05: qty 2

## 2020-10-05 MED ORDER — FUROSEMIDE 10 MG/ML IJ SOLN
40.0000 mg | Freq: Two times a day (BID) | INTRAMUSCULAR | Status: DC
  Administered 2020-10-06 – 2020-10-08 (×5): 40 mg via INTRAVENOUS
  Filled 2020-10-05 (×5): qty 4

## 2020-10-05 MED ORDER — NICOTINE 21 MG/24HR TD PT24
21.0000 mg | MEDICATED_PATCH | Freq: Every day | TRANSDERMAL | Status: DC
  Administered 2020-10-05 – 2020-10-07 (×3): 21 mg via TRANSDERMAL
  Filled 2020-10-05 (×3): qty 1

## 2020-10-05 MED ORDER — ENOXAPARIN SODIUM 40 MG/0.4ML ~~LOC~~ SOLN
40.0000 mg | SUBCUTANEOUS | Status: DC
  Administered 2020-10-05 – 2020-10-07 (×3): 40 mg via SUBCUTANEOUS
  Filled 2020-10-05 (×3): qty 0.4

## 2020-10-05 MED ORDER — GABAPENTIN 300 MG PO CAPS
300.0000 mg | ORAL_CAPSULE | Freq: Two times a day (BID) | ORAL | Status: DC
  Administered 2020-10-05 – 2020-10-08 (×6): 300 mg via ORAL
  Filled 2020-10-05 (×6): qty 1

## 2020-10-05 MED ORDER — ACETAMINOPHEN 325 MG PO TABS: 650.0000 mg | ORAL_TABLET | Freq: Four times a day (QID) | ORAL | Status: DC | PRN

## 2020-10-05 MED ORDER — LOSARTAN POTASSIUM 25 MG PO TABS
25.0000 mg | ORAL_TABLET | Freq: Every day | ORAL | Status: DC
  Administered 2020-10-05 – 2020-10-08 (×4): 25 mg via ORAL
  Filled 2020-10-05 (×4): qty 1

## 2020-10-05 MED ORDER — LORAZEPAM 1 MG PO TABS: 1.0000 mg | ORAL_TABLET | ORAL | Status: DC | PRN

## 2020-10-05 MED ORDER — COLCHICINE 0.6 MG PO TABS
0.6000 mg | ORAL_TABLET | Freq: Every day | ORAL | Status: DC | PRN
  Filled 2020-10-05: qty 1

## 2020-10-05 MED ORDER — THIAMINE HCL 100 MG PO TABS
100.0000 mg | ORAL_TABLET | Freq: Every day | ORAL | Status: DC
  Administered 2020-10-05 – 2020-10-08 (×4): 100 mg via ORAL
  Filled 2020-10-05 (×4): qty 1

## 2020-10-05 MED ORDER — MECLIZINE HCL 12.5 MG PO TABS
12.5000 mg | ORAL_TABLET | Freq: Three times a day (TID) | ORAL | Status: DC | PRN
  Filled 2020-10-05: qty 1

## 2020-10-05 MED ORDER — ONDANSETRON HCL 4 MG/2ML IJ SOLN: 4.0000 mg | Freq: Three times a day (TID) | INTRAMUSCULAR | Status: DC | PRN

## 2020-10-05 MED ORDER — CARVEDILOL 3.125 MG PO TABS
3.1250 mg | ORAL_TABLET | Freq: Two times a day (BID) | ORAL | Status: DC
  Administered 2020-10-06 – 2020-10-08 (×5): 3.125 mg via ORAL
  Filled 2020-10-05 (×5): qty 1

## 2020-10-05 MED ORDER — ADULT MULTIVITAMIN W/MINERALS CH
1.0000 | ORAL_TABLET | Freq: Every day | ORAL | Status: DC
  Administered 2020-10-05 – 2020-10-08 (×4): 1 via ORAL
  Filled 2020-10-05 (×3): qty 1

## 2020-10-05 MED ORDER — SODIUM CHLORIDE 0.9% FLUSH: 3.0000 mL | INTRAVENOUS | Status: DC | PRN

## 2020-10-05 MED ORDER — OXYCODONE HCL 5 MG PO TABS
10.0000 mg | ORAL_TABLET | Freq: Once | ORAL | Status: AC
  Administered 2020-10-05: 10 mg via ORAL
  Filled 2020-10-05: qty 2

## 2020-10-05 MED ORDER — LORAZEPAM 2 MG/ML IJ SOLN: 0.0000 mg | Freq: Two times a day (BID) | INTRAMUSCULAR | Status: DC

## 2020-10-05 MED ORDER — LORAZEPAM 2 MG/ML IJ SOLN: 1.0000 mg | INTRAMUSCULAR | Status: DC | PRN

## 2020-10-05 MED ORDER — DM-GUAIFENESIN ER 30-600 MG PO TB12: 1.0000 | ORAL_TABLET | Freq: Two times a day (BID) | ORAL | Status: DC | PRN

## 2020-10-05 MED ORDER — ALBUTEROL SULFATE (2.5 MG/3ML) 0.083% IN NEBU
2.5000 mg | INHALATION_SOLUTION | RESPIRATORY_TRACT | Status: DC | PRN
  Filled 2020-10-05: qty 3

## 2020-10-05 MED ORDER — HYDRALAZINE HCL 20 MG/ML IJ SOLN: 5.0000 mg | INTRAMUSCULAR | Status: DC | PRN

## 2020-10-05 MED ORDER — TRAZODONE HCL 50 MG PO TABS: 50.0000 mg | ORAL_TABLET | Freq: Every evening | ORAL | Status: DC | PRN

## 2020-10-05 MED ORDER — ASPIRIN EC 81 MG PO TBEC
81.0000 mg | DELAYED_RELEASE_TABLET | Freq: Every day | ORAL | Status: DC
  Administered 2020-10-05 – 2020-10-08 (×4): 81 mg via ORAL
  Filled 2020-10-05 (×4): qty 1

## 2020-10-05 MED ORDER — SODIUM CHLORIDE 0.9% FLUSH
3.0000 mL | Freq: Two times a day (BID) | INTRAVENOUS | Status: DC
  Administered 2020-10-06 – 2020-10-08 (×5): 3 mL via INTRAVENOUS

## 2020-10-05 NOTE — H&P (Signed)
History and Physical    Kurt Blankenship:[id number] DOB: Aug 31, 1956 DOA: 10/05/2020  Referring MD/NP/PA:   PCP: System, Provider Not In   Patient coming from:  The patient is coming from home.  At baseline, pt is independent for most of ADL.        Chief Complaint: Shortness of breath  HPI: Kurt Blankenship is a 64 y.o. male with medical history significant of sCHF with EF of 20-25%, hypertension, hyperlipidemia, COPD on 1-2 L oxygen at home, tobacco abuse, alcohol abuse, back pain, gout, who presents with shortness breath.  Patient states that he has been short of breath in the past several days, which has worsened since last night.  Patient has orthopnea, and bilateral leg edema. He had some chest pain earlier, which has resolved.  Currently no chest pain, fever or chills.  Patient has dry cough.  He states that he has gained 20 pounds of body weight recently.  No nausea, vomiting, diarrhea or abdominal pain, no symptoms of UTI or unilateral weakness.   ED Course: pt was found to have BNP 1305, troponin level 61, negative Covid PCR, electrolytes renal function okay, WBC 9.2, electrolytes renal function okay, blood pressure 132/101, heart rate 101, RR 21, oxygen saturation 94% on 2 L oxygen, chest x-ray showed interstitial edema and possible bilateral small pleural effusion.  Patient is admitted to progressive bed as inpatient.  Review of Systems:   General: no fevers, chills, has fatigue HEENT: no blurry vision, hearing changes or sore throat Respiratory: has dyspnea, coughing, wheezing CV: had chest pain, no palpitations GI: no nausea, vomiting, abdominal pain, diarrhea, constipation GU: no dysuria, burning on urination, increased urinary frequency, hematuria  Ext: has leg edema Neuro: no unilateral weakness, numbness, or tingling, no vision change or hearing loss Skin: no rash, no skin tear. MSK: No muscle spasm, no deformity, no limitation of range of movement in spin Heme: No  easy bruising.  Travel history: No recent long distant travel.  Allergy: No Known Allergies  Past Medical History:  Diagnosis Date  . Alcohol use   . CHF (congestive heart failure) (HCC)   . COPD (chronic obstructive pulmonary disease) (HCC)   . Degenerative joint disease    a. S/p bilateral knee replacements.  . Hypertension   . Low back pain   . Tobacco abuse     Past Surgical History:  Procedure Laterality Date  . Bilateral knee replacements    . RIGHT/LEFT HEART CATH AND CORONARY ANGIOGRAPHY N/A 06/10/2020   Procedure: RIGHT/LEFT HEART CATH AND CORONARY ANGIOGRAPHY;  Surgeon: Yvonne Kendall, MD;  Location: [facility] INVASIVE CV LAB;  Service: Cardiovascular;  Laterality: N/A;    Social History:  reports that he has been smoking cigarettes. He has been smoking about 1.50 packs per day. He has never used smokeless tobacco. He reports current alcohol use of about 1.0 - 2.0 standard drink of alcohol per week. He reports current drug use. Drug: Cocaine.  Family History:  Family History  Problem Relation Age of Onset  . Heart failure Mother        died w/ "enlarged heart" in late 37's  . Cancer Father   . COPD Sister   . Heart attack Brother        died in his late 81's  . COPD Sister   . COPD Sister      Prior to Admission medications   Medication Sig Start Date End Date Taking? Authorizing Provider  albuterol (PROVENTIL HFA;VENTOLIN HFA) 108 (  90 Base) MCG/ACT inhaler Inhale 2 puffs into the lungs every 4 (four) hours as needed for wheezing or shortness of breath.    [provider]  atorvastatin (LIPITOR) 20 MG tablet Take 1 tablet by mouth daily. 11/17/19 11/16/20  [provider]  budesonide-formoterol (SYMBICORT) 160-4.5 MCG/ACT inhaler Inhale 2 puffs into the lungs 2 (two) times daily.    [provider]  carvedilol (COREG) 3.125 MG tablet Take 1 tablet (3.125 mg total) by mouth 2 (two) times daily with a meal. 06/10/20 07/10/20  Arrien, York Ram, MD  Colchicine (MITIGARE) 0.6 MG CAPS Take 1 capsule by mouth daily as needed. 02/08/20   [provider]  furosemide (LASIX) 20 MG tablet Take 1 tablet (20 mg total) by mouth daily. Start on 06/11/20 06/11/20 07/11/20  Arrien, York Ram, MD  gabapentin (NEURONTIN) 300 MG capsule Take 1 capsule by mouth in the morning and at bedtime. 03/24/20 03/24/21  [provider]  losartan (COZAAR) 25 MG tablet Take 1 tablet (25 mg total) by mouth daily. 06/11/20 07/11/20  Arrien, York Ram, MD  meclizine (ANTIVERT) 12.5 MG tablet Take 1 tablet by mouth 3 (three) times daily as needed for dizziness. 11/17/19 11/16/20  [provider]  Oxycodone HCl 10 MG TABS Take 10 mg by mouth every 6 (six) hours.    [provider]  potassium chloride SA (KLOR-CON) 20 MEQ tablet Take 1 tablet (20 mEq total) by mouth daily. 06/11/20 07/11/20  Arrien, York Ram, MD  traZODone (DESYREL) 50 MG tablet Take 1 tablet by mouth at bedtime. 04/17/19   [provider]    Physical Exam: Vitals:   10/05/20 1600 10/05/20 1630 10/05/20 1730 10/05/20 1800  BP: 118/86 125/83 101/89 (!) 124/91  Pulse: 100 100 (!) 117 98  Resp: 20 19 (!) 22 (!) 22  SpO2: 95% 96% 92% 94%  Weight:      Height:       General: Not in acute distress HEENT:       Eyes: PERRL, EOMI, no scleral icterus.       ENT: No discharge from the ears and nose, no pharynx injection, no tonsillar enlargement.        Neck: Positive JVD, no bruit, no mass felt. Heme: No neck lymph node enlargement. Cardiac: S1/S2, RRR, No murmurs, No gallops or rubs. Respiratory: has fine crackles and mild wheezing bilaterally.  Air movement is decreased bilaterally GI: Soft, nondistended, nontender, no rebound pain, no organomegaly, BS present. GU: No hematuria Ext: has 2+ pitting leg edema bilaterally. 1+DP/PT pulse bilaterally. Musculoskeletal: No joint deformities, No joint redness or warmth, no limitation of ROM in  spin. Skin: No rashes.  Neuro: Alert, oriented X3, cranial nerves II-XII grossly intact, moves all extremities normally.  Psych: Patient is not psychotic, no suicidal or hemocidal ideation.  Labs on Admission: I have personally reviewed following labs and imaging studies  CBC: Recent Labs  Lab 10/05/20 1217  WBC 9.2  HGB 15.4  HCT 45.2  MCV 101.6*  PLT 219   Basic Metabolic Panel: Recent Labs  Lab 10/05/20 1217  NA 136  K 3.7  CL 99  CO2 27  GLUCOSE 100*  BUN 15  CREATININE 0.75  CALCIUM 9.1   GFR: Estimated Creatinine Clearance: 94.5 mL/min (by C-G formula based on SCr of 0.75 mg/dL). Liver Function Tests: No results for input(s): AST, ALT, ALKPHOS, BILITOT, PROT, ALBUMIN in the last 168 hours. No results for input(s): LIPASE, AMYLASE in the last 168  hours. No results for input(s): AMMONIA in the last 168 hours. Coagulation Profile: No results for input(s): INR, PROTIME in the last 168 hours. Cardiac Enzymes: No results for input(s): CKTOTAL, CKMB, CKMBINDEX, TROPONINI in the last 168 hours. BNP (last 3 results) No results for input(s): PROBNP in the last 8760 hours. HbA1C: No results for input(s): HGBA1C in the last 72 hours. CBG: No results for input(s): GLUCAP in the last 168 hours. Lipid Profile: No results for input(s): CHOL, HDL, LDLCALC, TRIG, CHOLHDL, LDLDIRECT in the last 72 hours. Thyroid Function Tests: No results for input(s): TSH, T4TOTAL, FREET4, T3FREE, THYROIDAB in the last 72 hours. Anemia Panel: No results for input(s): VITAMINB12, FOLATE, FERRITIN, TIBC, IRON, RETICCTPCT in the last 72 hours. Urine analysis:    Component Value Date/Time   COLORURINE AMBER (A) 02/25/2019 1203   APPEARANCEUR CLEAR (A) 02/25/2019 1203   APPEARANCEUR Clear 11/09/2012 1801   LABSPEC 1.029 02/25/2019 1203   LABSPEC 1.005 11/09/2012 1801   PHURINE 6.0 02/25/2019 1203   GLUCOSEU NEGATIVE 02/25/2019 1203   GLUCOSEU Negative 11/09/2012 1801   HGBUR NEGATIVE  02/25/2019 1203   BILIRUBINUR NEGATIVE 02/25/2019 1203   BILIRUBINUR Negative 11/09/2012 1801   KETONESUR 20 (A) 02/25/2019 1203   PROTEINUR 100 (A) 02/25/2019 1203   NITRITE NEGATIVE 02/25/2019 1203   LEUKOCYTESUR NEGATIVE 02/25/2019 1203   LEUKOCYTESUR Negative 11/09/2012 1801   Sepsis Labs: @LABRCNTIP (procalcitonin:4,lacticidven:4) ) Recent Results (from the past 240 hour(s))  Respiratory Panel by RT PCR (Flu A&B, Covid) - Nasopharyngeal Swab     Status: None   Collection Time: 10/05/20  1:25 PM   Specimen: Nasopharyngeal Swab  Result Value Ref Range Status   SARS Coronavirus 2 by RT PCR NEGATIVE NEGATIVE Final    Comment: (NOTE) SARS-CoV-2 target nucleic acids are NOT DETECTED.  The SARS-CoV-2 RNA is generally detectable in upper respiratoy specimens during the acute phase of infection. The lowest concentration of SARS-CoV-2 viral copies this assay can detect is 131 copies/mL. A negative result does not preclude SARS-Cov-2 infection and should not be used as the sole basis for treatment or other patient management decisions. A negative result may occur with  improper specimen collection/handling, submission of specimen other than nasopharyngeal swab, presence of viral mutation(s) within the areas targeted by this assay, and inadequate number of viral copies (<131 copies/mL). A negative result must be combined with clinical observations, patient history, and epidemiological information. The expected result is Negative.  Fact Sheet for Patients:  https://www.moore.com/  Fact Sheet for Healthcare Providers:  https://www.young.biz/  This test is no t yet approved or cleared by the [place] FDA and  has been authorized for detection and/or diagnosis of SARS-CoV-2 by FDA under an Emergency Use Authorization (EUA). This EUA will remain  in effect (meaning this test can be used) for the duration of the COVID-19 declaration under  Section 564(b)(1) of the Act, 21 U.S.C. section 360bbb-3(b)(1), unless the authorization is terminated or revoked sooner.     Influenza A by PCR NEGATIVE NEGATIVE Final   Influenza B by PCR NEGATIVE NEGATIVE Final    Comment: (NOTE) The Xpert Xpress SARS-CoV-2/FLU/RSV assay is intended as an aid in  the diagnosis of influenza from Nasopharyngeal swab specimens and  should not be used as a sole basis for treatment. Nasal washings and  aspirates are unacceptable for Xpert Xpress SARS-CoV-2/FLU/RSV  testing.  Fact Sheet for Patients: https://www.moore.com/  Fact Sheet for Healthcare Providers: https://www.young.biz/  This test is not yet approved or cleared by the  [place] [employer] and  has been authorized for detection and/or diagnosis of SARS-CoV-2 by  FDA under an TEFL teacher (EUA). This EUA will remain  in effect (meaning this test can be used) for the duration of the  Covid-19 declaration under Section 564(b)(1) of the Act, 21  U.S.C. section 360bbb-3(b)(1), unless the authorization is  terminated or revoked. Performed at [facility], [address], [place], [place] [address]      Radiological Exams on Admission: DG Chest 2 View  Result Date: 10/05/2020 CLINICAL DATA:  Shortness of breath. EXAM: CHEST - 2 VIEW COMPARISON:  May 07, 2020 FINDINGS: Borderline enlargement the cardiac silhouette. New diffuse interstitial opacities with patchy airspace opacities, greatest at the right lung base. Suspect small bilateral pleural effusions with fluid tracking along the right major fissure. No discernible pneumothorax. No acute osseous abnormality. IMPRESSION: 1. New diffuse interstitial opacities with patchy airspace opacities, compatible with edema and/or infection. 2. Suspect small bilateral pleural effusions with fluid tracking along the right major fissure. 3. Borderline cardiomegaly. Electronically Signed   By:  Feliberto Harts MD   On: 10/05/2020 13:01     EKG:  Not done in ED, will get one.   Assessment/Plan Principal Problem:   Acute on chronic systolic CHF (congestive heart failure) (HCC) Active Problems:   COPD exacerbation (HCC)   Elevated troponin   HTN (hypertension)   HLD (hyperlipidemia)   Alcohol use   Tobacco abuse   Acute on chronic systolic CHF (congestive heart failure) (HCC): 2D echo on 6/20//21 showed EF of 20-25%.  Patient has elevated BNP 1305, bilateral leg edema, positive JVD, lung crackles on auscultation, consistent with CHF exacerbation.  -Will admit to progressive unit as inpatient -Lasix 40 mg bid by IV (patient received 60 mg of Lasix by IV in ED) -2d echo -Daily weights -strict I/O's -Low salt diet -Fluid restriction -Obtain REDs Vest reading  COPD exacerbation [facility]): Patient has some mild wheezing on auscultation, indicating COPD exacerbation. -Bronchodilators -As needed Mucinex -Patient received 125 mg of Solu-Medrol  Elevated troponin: Troponin 61 --> 49. No chest pain, likely due to demand ischemia -Aspirin, Coreg -Check UDS -Check A1c, FLP -Trend troponin -Get EKG -Follow-up 2D echo  HTN (hypertension) -IV hydralazine as needed -Continue Coreg, Cozaar  HLD (hyperlipidemia): Patient used to take Lipitor, currently not taking his medications -Follow-up FLP  Tobacco abuse and Alcohol abuse: -Did counseling about importance of quitting smoking -Nicotine patch -Did counseling about the importance of quitting drinking -CIWA protocol      DVT ppx: SQ Lovenox Code Status: Full code Family Communication: not done, no family member is at bed side.   Disposition Plan:  Anticipate discharge back to previous environment Consults called:  none Admission status:   progressive unit as inpt       Status is: Inpatient  Remains inpatient appropriate because:Inpatient level of care appropriate due to severity of illness. Patient has multiple  comorbidities, now presents with combination of CHF and COPD exacerbation.  Patient also has elevated troponin.  BNP is up to 1305.  His presentation is highly complicated.  Patient is at high risk of deteriorating given his very low EF 20-25%.  Patient will need to be treated in hospital for at least 2 days.   Dispo: The patient is from: Home              Anticipated d/c is to: Home  Anticipated d/c date is: 2 days              Patient currently is not medically stable to d/c.           Date of Service 10/05/2020    Lorretta Harp [facility]   If 7PM-7AM, please contact night-coverage [email or web address] 10/05/2020, 6:16 PM

## 2020-10-05 NOTE — ED Triage Notes (Signed)
Pt comes into the ED via [facility] from home c/o increased SHOB.  Pt has h/o CHF.  Pt wears 1-2L at home PRN.  Pt given 2 duonebs, 125 solumedrol.  Pt has audible wheezing.

## 2020-10-05 NOTE — ED Provider Notes (Signed)
[facility] Emergency Department Provider Note  ____________________________________________   I have reviewed the triage vital signs and the nursing notes.   HISTORY  Chief Complaint Shortness of Breath   History limited by: Not Limited   HPI Kurt Blankenship is a 64 y.o. male who presents to the emergency department today because of concern for shortness of breath. The patient states that it started somewhat acutely last night. He had to sit up because lying flat made the breathing worse. This has been accompanied by cough. He does have a history of heart failure and states he has been taking his medication as prescribed. He did have some accompanying chest pain when the symptoms first began however that has since resolved. He denies any fevers. He has noticed leg swelling for the past week.   Records reviewed. Per medical record review patient has a history of COPD, CHF.   Past Medical History:  Diagnosis Date  . Alcohol use   . CHF (congestive heart failure) (HCC)   . COPD (chronic obstructive pulmonary disease) (HCC)   . Degenerative joint disease    a. S/p bilateral knee replacements.  . Hypertension   . Low back pain   . Tobacco abuse     Patient Active Problem List   Diagnosis Date Noted  . Cardiomyopathy (HCC)   . Syncope 06/08/2020  . Syncope and collapse 06/08/2020  . Hypertension   . Hypokalemia   . COPD (chronic obstructive pulmonary disease) (HCC) 05/15/2016    Past Surgical History:  Procedure Laterality Date  . Bilateral knee replacements    . RIGHT/LEFT HEART CATH AND CORONARY ANGIOGRAPHY N/A 06/10/2020   Procedure: RIGHT/LEFT HEART CATH AND CORONARY ANGIOGRAPHY;  Surgeon: Yvonne Kendall, MD;  Location: [facility] INVASIVE CV LAB;  Service: Cardiovascular;  Laterality: N/A;    Prior to Admission medications   Medication Sig Start Date End Date Taking? Authorizing Provider  albuterol (PROVENTIL HFA;VENTOLIN HFA) 108 (90 Base) MCG/ACT  inhaler Inhale 2 puffs into the lungs every 4 (four) hours as needed for wheezing or shortness of breath.    [provider]  atorvastatin (LIPITOR) 20 MG tablet Take 1 tablet by mouth daily. 11/17/19 11/16/20  [provider]  budesonide-formoterol (SYMBICORT) 160-4.5 MCG/ACT inhaler Inhale 2 puffs into the lungs 2 (two) times daily.    [provider]  carvedilol (COREG) 3.125 MG tablet Take 1 tablet (3.125 mg total) by mouth 2 (two) times daily with a meal. 06/10/20 07/10/20  Arrien, York Ram, MD  Colchicine (MITIGARE) 0.6 MG CAPS Take 1 capsule by mouth daily as needed. 02/08/20   [provider]  furosemide (LASIX) 20 MG tablet Take 1 tablet (20 mg total) by mouth daily. Start on 06/11/20 06/11/20 07/11/20  Arrien, York Ram, MD  gabapentin (NEURONTIN) 300 MG capsule Take 1 capsule by mouth in the morning and at bedtime. 03/24/20 03/24/21  [provider]  losartan (COZAAR) 25 MG tablet Take 1 tablet (25 mg total) by mouth daily. 06/11/20 07/11/20  Arrien, York Ram, MD  meclizine (ANTIVERT) 12.5 MG tablet Take 1 tablet by mouth 3 (three) times daily as needed for dizziness. 11/17/19 11/16/20  [provider]  Oxycodone HCl 10 MG TABS Take 10 mg by mouth every 6 (six) hours.    [provider]  potassium chloride SA (KLOR-CON) 20 MEQ tablet Take 1 tablet (20 mEq total) by mouth daily. 06/11/20 07/11/20  Arrien, York Ram, MD  traZODone (DESYREL) 50 MG tablet Take 1 tablet  by mouth at bedtime. 04/17/19   [provider]    Allergies Patient has no known allergies.  Family History  Problem Relation Age of Onset  . Heart failure Mother        died w/ "enlarged heart" in late 75's  . Cancer Father   . COPD Sister   . Heart attack Brother        died in his late 4's  . COPD Sister   . COPD Sister     Social History Social History   Tobacco Use  . Smoking status: Current Every Day Smoker    Packs/day: 1.50     Types: Cigarettes  . Smokeless tobacco: Never Used  Vaping Use  . Vaping Use: Never used  Substance Use Topics  . Alcohol use: Yes    Alcohol/week: 1.0 - 2.0 standard drink    Types: 1 - 2 Cans of beer per week    Comment: 1-2 beer daily  . Drug use: Yes    Types: Cocaine    Comment: pt denies    Review of Systems Constitutional: No fever/chills Eyes: No visual changes. ENT: No sore throat. Cardiovascular: Denies chest pain. Respiratory: Positive for shortness of breath.  Gastrointestinal: No abdominal pain.  No nausea, no vomiting.  No diarrhea.   Genitourinary: Negative for dysuria. Musculoskeletal: Positive for leg swelling.  Skin: Negative for rash. Neurological: Negative for headaches, focal weakness or numbness.  ____________________________________________   PHYSICAL EXAM:  VITAL SIGNS: ED Triage Vitals  Enc Vitals Group     BP --      Pulse --      Resp --      Temp --      Temp src --      SpO2 --      Weight 10/05/20 1212 187 lb (84.8 kg)     Height 10/05/20 1212 5\' 9"  (1.753 m)     Head Circumference --      Peak Flow --      Pain Score 10/05/20 1211 8   Constitutional: Alert and oriented.  Eyes: Conjunctivae are normal.  ENT      Head: Normocephalic and atraumatic.      Nose: No congestion/rhinnorhea.      Mouth/Throat: Mucous membranes are moist.      Neck: No stridor. Hematological/Lymphatic/Immunilogical: No cervical lymphadenopathy. Cardiovascular: Normal rate, regular rhythm.  No murmurs, rubs, or gallops.  Respiratory: Positive for increased respiratory effort. Diffuse wheezing.  Gastrointestinal: Soft and non tender. No rebound. No guarding.  Genitourinary: Deferred Musculoskeletal: Normal range of motion in all extremities. Positive for lower extremity edema.  Neurologic:  Normal speech and language. No gross focal neurologic deficits are appreciated.  Skin:  Skin is warm, dry and intact. No rash noted. Psychiatric: Mood and affect  are normal. Speech and behavior are normal. Patient exhibits appropriate insight and judgment.  ____________________________________________    LABS (pertinent positives/negatives)  CBC wbc 9.2, hgb 15.4, plt 219 BMP wnl except glu 100 Covid negative BNP 1305.7 Trop hs 61 Procalcitonin <0.10 ____________________________________________   EKG  I, 10/07/20, attending physician, personally viewed and interpreted this EKG  EKG Time: 1214 Rate: 100 Rhythm: sinus tachycardia Axis: normal Intervals: qtc 556 QRS: LVH ST changes: no st elevation Impression: abnormal ekg ____________________________________________    RADIOLOGY  CXR New diffuse opacities  ____________________________________________   PROCEDURES  Procedures  ____________________________________________   INITIAL IMPRESSION / ASSESSMENT AND PLAN / ED COURSE  Pertinent labs & imaging results  that were available during my care of the patient were reviewed by me and considered in my medical decision making (see chart for details).   Patient presented to the emergency department today because of concern for for shortness of breath. Patient has history of COPD and CHF and is on 2L o2 chronically. The patient was given breathing treatments and steroids by EMS. Exam here does demonstrate wheezing and lower extremity edema. Chest x-ray and bnp elevated. Will plan on admission and IV lasix. Covid negative.  ___________________________________________   FINAL CLINICAL IMPRESSION(S) / ED DIAGNOSES  Final diagnoses:  Congestive heart failure, unspecified HF chronicity, unspecified heart failure type (HCC)  SOB (shortness of breath)     Note: This dictation was prepared with Dragon dictation. Any transcriptional errors that result from this process are unintentional     Phineas Semen, MD 10/05/20 [phone]

## 2020-10-05 NOTE — ED Notes (Signed)
Patient urinated 

## 2020-10-05 NOTE — ED Notes (Signed)
Patient transported to X-ray 

## 2020-10-06 ENCOUNTER — Inpatient Hospital Stay: Payer: Medicare HMO

## 2020-10-06 ENCOUNTER — Inpatient Hospital Stay (HOSPITAL_COMMUNITY)
Admit: 2020-10-06 | Discharge: 2020-10-06 | Disposition: A | Discharge status: Home / Self Care | Payer: Medicare HMO | Attending: Internal Medicine | Admitting: Internal Medicine

## 2020-10-06 ENCOUNTER — Encounter: Payer: Self-pay | Admitting: Internal Medicine

## 2020-10-06 DIAGNOSIS — I5023 Acute on chronic systolic (congestive) heart failure: Secondary | ICD-10-CM

## 2020-10-06 DIAGNOSIS — I5021 Acute systolic (congestive) heart failure: Secondary | ICD-10-CM

## 2020-10-06 DIAGNOSIS — I5043 Acute on chronic combined systolic (congestive) and diastolic (congestive) heart failure: Secondary | ICD-10-CM

## 2020-10-06 LAB — BASIC METABOLIC PANEL
Anion gap: 8 (ref 5–15)
BUN: 18 mg/dL (ref 8–23)
CO2: 29 mmol/L (ref 22–32)
Calcium: 9 mg/dL (ref 8.9–10.3)
Chloride: 95 mmol/L — ABNORMAL LOW (ref 98–111)
Creatinine, Ser: 0.85 mg/dL (ref 0.61–1.24)
GFR, Estimated: >60 mL/min (ref >60)
Glucose, Bld: 175 mg/dL — ABNORMAL HIGH (ref 70–99)
Potassium: 4.3 mmol/L (ref 3.5–5.1)
Sodium: 132 mmol/L — ABNORMAL LOW (ref 135–145)

## 2020-10-06 LAB — ECHOCARDIOGRAM COMPLETE
AR max vel: 2.96 cm2
AV Area VTI: 2.77 cm2
AV Area mean vel: 2.85 cm2
AV Mean grad: 1 mmHg
AV Peak grad: 2.3 mmHg
Ao pk vel: 0.75 m/s
Area-P 1/2: 5.9 cm2
Height: 69 in
S' Lateral: 5.29 cm
Weight: 2992 oz

## 2020-10-06 LAB — URINE DRUG SCREEN, QUALITATIVE (ARMC ONLY)
Amphetamines, Ur Screen: NOT DETECTED
Barbiturates, Ur Screen: NOT DETECTED
Benzodiazepine, Ur Scrn: NOT DETECTED
Cannabinoid 50 Ng, Ur ~~LOC~~: NOT DETECTED
Cocaine Metabolite,Ur ~~LOC~~: NOT DETECTED
MDMA (Ecstasy)Ur Screen: NOT DETECTED
Methadone Scn, Ur: NOT DETECTED
Opiate, Ur Screen: NOT DETECTED
Phencyclidine (PCP) Ur S: NOT DETECTED
Tricyclic, Ur Screen: NOT DETECTED

## 2020-10-06 LAB — LIPID PANEL
Cholesterol: 123 mg/dL (ref 0–200)
HDL: 41 mg/dL (ref >40)
LDL Cholesterol: 73 mg/dL (ref 0–99)
Total CHOL/HDL Ratio: 3 RATIO
Triglycerides: 46 mg/dL (ref <150)
VLDL: 9 mg/dL (ref 0–40)

## 2020-10-06 LAB — MAGNESIUM: Magnesium: 2.1 mg/dL (ref 1.7–2.4)

## 2020-10-06 LAB — CBC
HCT: 42.5 % (ref 39.0–52.0)
Hemoglobin: 14.5 g/dL (ref 13.0–17.0)
MCH: 35.2 pg — ABNORMAL HIGH (ref 26.0–34.0)
MCHC: 34.1 g/dL (ref 30.0–36.0)
MCV: 103.2 fL — ABNORMAL HIGH (ref 80.0–100.0)
Platelets: 188 10*3/uL (ref 150–400)
RBC: 4.12 MIL/uL — ABNORMAL LOW (ref 4.22–5.81)
RDW: 13.4 % (ref 11.5–15.5)
WBC: 7.4 10*3/uL (ref 4.0–10.5)
nRBC: 0 % (ref 0.0–0.2)

## 2020-10-06 LAB — TROPONIN I (HIGH SENSITIVITY): Troponin I (High Sensitivity): 40 ng/L — ABNORMAL HIGH (ref <18)

## 2020-10-06 MED ORDER — PERFLUTREN LIPID MICROSPHERE
1.0000 mL | INTRAVENOUS | Status: AC | PRN
  Administered 2020-10-06: 2 mL via INTRAVENOUS
  Filled 2020-10-06: qty 10

## 2020-10-06 MED ORDER — ALBUTEROL SULFATE HFA 108 (90 BASE) MCG/ACT IN AERS
1.0000 | INHALATION_SPRAY | RESPIRATORY_TRACT | Status: DC | PRN
  Filled 2020-10-06: qty 6.7

## 2020-10-06 NOTE — Progress Notes (Signed)
Heart Failure Nurse Navigator Note  HFrEF 20-25%  He presented with over 20 pound weight gain, bilateral leg edema, chest pain PND, orthopnea and PND  Co morbidities:  Hypertension Hyperlipidemia COPD Gout   He also has history of drug abuse (cocaine), alcohol and tobacco abuse.  BMI 27.62  Medications:  Lipitor 20 mg daily Lasix 40 mg IV every 12 hours Losartan 25 mg daily Potassium 20 meq daily Coreg 3.125 mg BID   Uses 02 prn 1-2 liters prn  Labs:  BNP 1305, sodium 132, potassium 4.3, BUN 18, creatinine 0.85, magnesium 2.1, pro calcitonin <0.1, total cholesterol 123,, triglycerides 46, Hdl 41, Ldl 73, hemoglobin 14.5, hematocrit 42.5.    CXR- interstitial edema  Assessment:  General- he is awake and alert, sitting upright on gurney in the ED.  HEENT- normocephalic, pupils equal, ear lobes cyanotic.+JVD  Cardiac-heart tones distant but regular.  Chest- diminshed breath sounds with expiratory wheezes.  Gi- abdomen, soft non tender  Musculoskeletal- 1+ edema bilateral legs   Psych- is pleasant and appropriate  Neuro-speech is clear.    On exam he was sitting at a 90 degree angle, appears SOB.  He states that he weighs himself and had noted a gradual weight gain but did not call physician.  He does not follow a low sodium diet or limit his fluid intake.  Discussed with the function of his heart, he should be limiting both.    Given heart failure teaching booklet and zone magnet.  He expressed wishes that he could be considered for lung transplant, explained that he would have to quit smoking for at least 6 months before being considered.  Will continue to follow.   Tresa Endo RN, CHFN

## 2020-10-06 NOTE — Progress Notes (Signed)
Patient found sleeping with [place] off, SAT at 77%. Patient placed on 4L [place] with SATs improving to 94%. Scheduled breathing treatment given. Patient resting comfortably in bed with [place] on.

## 2020-10-06 NOTE — Progress Notes (Signed)
PROGRESS NOTE    Kurt Blankenship  HAL:[id number] DOB: 1956/06/01 DOA: 10/05/2020 PCP: System, Provider Not In    Assessment & Plan:   Principal Problem:   Acute on chronic systolic CHF (congestive heart failure) (HCC) Active Problems:   COPD exacerbation (HCC)   Elevated troponin   HTN (hypertension)   HLD (hyperlipidemia)   Alcohol use   Tobacco abuse   Acute on chronic systolic CHF exacerbation: echo on 6/20//21 showed EF of 20-25%. Repeat echo pending. Continue on IV lasix. Monitor I/Os & daily weights. REDs Vest reading is pending.   COPD exacerbation: continue on bronchodilators and encourage incentive spirometry. Mucinex prn   Elevated troponin: likely secondary to demand ischemia  HTN: continue on coreg, losartan. IV hydralazine prn   HLD: pt is no longer taking a statin   Tobacco abuse: nicotine patch to prevent w/drawal. Smoking cessation counseling  Alcohol abuse: continue on CIWA protocol. Alcohol cessation counseling    DVT prophylaxis: lovenox  Code Status: full  Family Communication: Disposition Plan: depends on PT/OT recs   Status is: Inpatient  Remains inpatient appropriate because:Ongoing diagnostic testing needed not appropriate for outpatient work up and IV treatments appropriate due to intensity of illness or inability to take PO   Dispo: The patient is from: Home              Anticipated d/c is to: Home  (depends on PT/OT recs)               Anticipated d/c date is: 3 days or 2 days               Patient currently is not medically stable to d/c.     Consultants:    Procedures:   Antimicrobials:    Subjective: Pt c/o shortness of breath   Objective: Vitals:   10/06/20 0700 10/06/20 0715 10/06/20 0730 10/06/20 0800  BP: (!) 119/102  104/87   Pulse:  92 95 92  Resp:  (!) 27 20 17   SpO2:  95% 94% 91%  Weight:      Height:        Intake/Output Summary (Last 24 hours) at 10/06/2020 0820 Last data filed at 10/06/2020  0215 Gross per 24 hour  Intake --  Output 1050 ml  Net -1050 ml   Filed Weights   10/05/20 1212  Weight: 84.8 kg    Examination:  General exam: Appears calm and comfortable  Respiratory system: course breath sounds b/l Cardiovascular system: S1 & S2+. No  rubs, gallops or clicks.  Gastrointestinal system: Abdomen is nondistended, soft and nontender.  Normal bowel sounds heard. Central nervous system: Alert and oriented. Moves all 4 extremities  Psychiatry: Judgement and insight appear normal. Flat mood and affect     Data Reviewed: I have personally reviewed following labs and imaging studies  CBC: Recent Labs  Lab 10/05/20 1217 10/06/20 0409  WBC 9.2 7.4  HGB 15.4 14.5  HCT 45.2 42.5  MCV 101.6* 103.2*  PLT 219 188   Basic Metabolic Panel: Recent Labs  Lab 10/05/20 1217 10/06/20 0409  NA 136 132*  K 3.7 4.3  CL 99 95*  CO2 27 29  GLUCOSE 100* 175*  BUN 15 18  CREATININE 0.75 0.85  CALCIUM 9.1 9.0  MG  --  2.1   GFR: Estimated Creatinine Clearance: 89 mL/min (by C-G formula based on SCr of 0.85 mg/dL). Liver Function Tests: No results for input(s): AST, ALT, ALKPHOS, BILITOT, PROT, ALBUMIN in the  last 168 hours. No results for input(s): LIPASE, AMYLASE in the last 168 hours. No results for input(s): AMMONIA in the last 168 hours. Coagulation Profile: Recent Labs  Lab 10/05/20 1857  INR 1.1   Cardiac Enzymes: No results for input(s): CKTOTAL, CKMB, CKMBINDEX, TROPONINI in the last 168 hours. BNP (last 3 results) No results for input(s): PROBNP in the last 8760 hours. HbA1C: No results for input(s): HGBA1C in the last 72 hours. CBG: No results for input(s): GLUCAP in the last 168 hours. Lipid Profile: Recent Labs    10/06/20 0409  CHOL 123  HDL 41  LDLCALC 73  TRIG 46  CHOLHDL 3.0   Thyroid Function Tests: No results for input(s): TSH, T4TOTAL, FREET4, T3FREE, THYROIDAB in the last 72 hours. Anemia Panel: No results for input(s):  VITAMINB12, FOLATE, FERRITIN, TIBC, IRON, RETICCTPCT in the last 72 hours. Sepsis Labs: Recent Labs  Lab 10/05/20 1221  PROCALCITON <0.10    Recent Results (from the past 240 hour(s))  Respiratory Panel by RT PCR (Flu A&B, Covid) - Nasopharyngeal Swab     Status: None   Collection Time: 10/05/20  1:25 PM   Specimen: Nasopharyngeal Swab  Result Value Ref Range Status   SARS Coronavirus 2 by RT PCR NEGATIVE NEGATIVE Final    Comment: (NOTE) SARS-CoV-2 target nucleic acids are NOT DETECTED.  The SARS-CoV-2 RNA is generally detectable in upper respiratoy specimens during the acute phase of infection. The lowest concentration of SARS-CoV-2 viral copies this assay can detect is 131 copies/mL. A negative result does not preclude SARS-Cov-2 infection and should not be used as the sole basis for treatment or other patient management decisions. A negative result may occur with  improper specimen collection/handling, submission of specimen other than nasopharyngeal swab, presence of viral mutation(s) within the areas targeted by this assay, and inadequate number of viral copies (<131 copies/mL). A negative result must be combined with clinical observations, patient history, and epidemiological information. The expected result is Negative.  Fact Sheet for Patients:  https://www.moore.com/  Fact Sheet for Healthcare Providers:  https://www.young.biz/  This test is no t yet approved or cleared by the [place] FDA and  has been authorized for detection and/or diagnosis of SARS-CoV-2 by FDA under an Emergency Use Authorization (EUA). This EUA will remain  in effect (meaning this test can be used) for the duration of the COVID-19 declaration under Section 564(b)(1) of the Act, 21 U.S.C. section 360bbb-3(b)(1), unless the authorization is terminated or revoked sooner.     Influenza A by PCR NEGATIVE NEGATIVE Final   Influenza B by PCR NEGATIVE  NEGATIVE Final    Comment: (NOTE) The Xpert Xpress SARS-CoV-2/FLU/RSV assay is intended as an aid in  the diagnosis of influenza from Nasopharyngeal swab specimens and  should not be used as a sole basis for treatment. Nasal washings and  aspirates are unacceptable for Xpert Xpress SARS-CoV-2/FLU/RSV  testing.  Fact Sheet for Patients: https://www.moore.com/  Fact Sheet for Healthcare Providers: https://www.young.biz/  This test is not yet approved or cleared by the [place] FDA and  has been authorized for detection and/or diagnosis of SARS-CoV-2 by  FDA under an Emergency Use Authorization (EUA). This EUA will remain  in effect (meaning this test can be used) for the duration of the  Covid-19 declaration under Section 564(b)(1) of the Act, 21  U.S.C. section 360bbb-3(b)(1), unless the authorization is  terminated or revoked. Performed at [facility], [address], [place], [place] [address]  Radiology Studies: DG Chest 2 View  Result Date: 10/05/2020 CLINICAL DATA:  Shortness of breath. EXAM: CHEST - 2 VIEW COMPARISON:  May 07, 2020 FINDINGS: Borderline enlargement the cardiac silhouette. New diffuse interstitial opacities with patchy airspace opacities, greatest at the right lung base. Suspect small bilateral pleural effusions with fluid tracking along the right major fissure. No discernible pneumothorax. No acute osseous abnormality. IMPRESSION: 1. New diffuse interstitial opacities with patchy airspace opacities, compatible with edema and/or infection. 2. Suspect small bilateral pleural effusions with fluid tracking along the right major fissure. 3. Borderline cardiomegaly. Electronically Signed   By: Feliberto Harts MD   On: 10/05/2020 13:01        Scheduled Meds: . aspirin EC  81 mg Oral Daily  . carvedilol  3.125 mg Oral BID WC  . enoxaparin (LOVENOX) injection  40 mg Subcutaneous Q24H  . folic acid   1 mg Oral Daily  . furosemide  40 mg Intravenous Q12H  . gabapentin  300 mg Oral BID  . ipratropium-albuterol  3 mL Nebulization Q4H  . LORazepam  0-4 mg Intravenous Q6H   Followed by  . [START ON 10/07/2020] LORazepam  0-4 mg Intravenous Q12H  . losartan  25 mg Oral Daily  . multivitamin with minerals  1 tablet Oral Daily  . nicotine  21 mg Transdermal Daily  . oxyCODONE  10 mg Oral Q6H  . sodium chloride flush  3 mL Intravenous Q12H  . thiamine  100 mg Oral Daily   Or  . thiamine  100 mg Intravenous Daily   Continuous Infusions: . sodium chloride       LOS: 1 day    Time spent: 35 mins     Charise Killian, MD [facility] Pager [phone]  If 7PM-7AM, please contact night-coverage [email or web address] 10/06/2020, 8:20 AM

## 2020-10-06 NOTE — Progress Notes (Signed)
*  PRELIMINARY RESULTS* Echocardiogram 2D Echocardiogram has been performed.  Kurt Blankenship 10/06/2020, 11:50 AM

## 2020-10-06 NOTE — Evaluation (Signed)
Occupational Therapy Evaluation Patient Details Name: Kurt Blankenship MRN: [id number] DOB: Feb 01, 1956 Today's Date: 10/06/2020    History of Present Illness Pt is 64 y/o M with PMH: sCHF w/ EF 20-25%, HTN, HLD, COPD on 1-2Lnc at home, tobaccoa abuse, alcohol abuse, back pain, and gout. Pt presents SOB. Current MD assessment includes: Acute on chronic CHF exacerbation-echo on 6/20//21 showed EF of 20-25%. Repeat echo pending; COPD exacerbation, elevated troponin-likely secondary to demand ischemia   Clinical Impression   Pt seen for OT Evaluation this date in setting of presenting to acute hospital d/t SOB. Pt reports living with male friend in [facility] with ~3 STE. Pt reports being INDEP with all I/ADLs and mobility at baseline with no AD, uses home O2. On assessment this date, pt able to perform all his self care at MOD I level with increased time d/t reduced endurance/tolerance in setting of acute CHF exacerbation. Pt INDEP with bed mobility and transfer to standing. Of note: de-sats with transition to sitting/standing to as low as ~81% with questionable pleth. Pt on 4Lnc initially, OT increases to 5Lnc for transition to sitting/standing. Pt requires ~30 seconds-43min with PLB and rest to increase to >90%. RN notified. Do not detect further acute OT needs at this time. Will complete order. Please re-consult should pt's needs change. Anticipate pt could benefit from f/u with pulmonary rehab and regular intervention with mobility specialists while in acute setting.     Follow Up Recommendations  No OT follow up;Other (comment) (anticipate pt could benefit from pulmonary rehab upon f/u. Please consult mobility specialists to see pt while he is inpatient.)    Equipment Recommendations  None recommended by OT    Recommendations for Other Services       Precautions / Restrictions Precautions Precautions: None Restrictions Weight Bearing Restrictions: No Other Position/Activity Restrictions: monitor O2       Mobility Bed Mobility Overal bed mobility: Independent                  Transfers Overall transfer level: Independent               General transfer comment: de-sats with transition to sitting/standing to as low as ~81% with questionable pleth. Pt on 4Lnc initially, OT increases to 5Lnc for transition to sitting/standing. Pt requires ~30 seconds-81min with PLB and rest to increase to >90%.    Balance Overall balance assessment: Mild deficits observed, not formally tested                                         ADL either performed or assessed with clinical judgement   ADL Overall ADL's : Modified independent;At baseline                                             Vision Baseline Vision/History: Wears glasses Wears Glasses: Reading only Patient Visual Report: No change from baseline       Perception     Praxis      Pertinent Vitals/Pain Pain Assessment: No/denies pain     Hand Dominance Right   Extremity/Trunk Assessment Upper Extremity Assessment Upper Extremity Assessment: Overall WFL for tasks assessed   Lower Extremity Assessment Lower Extremity Assessment: Overall WFL for tasks assessed       Communication  Communication Communication: No difficulties   Cognition Arousal/Alertness: Awake/alert Behavior During Therapy: WFL for tasks assessed/performed Overall Cognitive Status: Within Functional Limits for tasks assessed                                     General Comments       Exercises Other Exercises Other Exercises: OT faciltiates ed re: role of OT and EC strategies for I/ADLs. Pt with good understanding.   Shoulder Instructions      Home Living Family/patient expects to be discharged to:: Private residence Living Arrangements: Non-relatives/Friends Available Help at Discharge: Friend(s);Available PRN/intermittently Type of Home: Other(Comment) ("modular home") Home Access:  Stairs to enter [employer] of Steps: 3   Home Layout: One level     Bathroom Shower/Tub: Tub/shower unit         Home Equipment: Other (comment) (home oxygen)          Prior Functioning/Environment Level of Independence: Independent        Comments: Pt reports his friend drives him or he gets county transportation to MD appts. States he uses no equipment. Able to perform I/ADLs I'ly at baseline. Endorses recently decreased endruance/tolerance        OT Problem List: Decreased activity tolerance;Cardiopulmonary status limiting activity      OT Treatment/Interventions: Self-care/ADL training;Therapeutic activities    OT Goals(Current goals can be found in the care plan section) Acute Rehab OT Goals Patient Stated Goal: to go home OT Goal Formulation: All assessment and education complete, DC therapy  OT Frequency:     Barriers to D/C:            Co-evaluation              AM-PAC OT "6 Clicks" Daily Activity     Outcome Measure Help from another person eating meals?: None Help from another person taking care of personal grooming?: None Help from another person toileting, which includes using toliet, bedpan, or urinal?: None Help from another person bathing (including washing, rinsing, drying)?: None Help from another person to put on and taking off regular upper body clothing?: None Help from another person to put on and taking off regular lower body clothing?: None 6 Click Score: 24   End of Session Equipment Utilized During Treatment: Oxygen Nurse Communication: Mobility status;Other (comment) (O2 status with mobility)  Activity Tolerance: Patient tolerated treatment well Patient left: in bed;with call bell/phone within reach  OT Visit Diagnosis: Other abnormalities of gait and mobility (R26.89)                Time: 5027-7412 OT Time Calculation (min): 23 min Charges:  OT General Charges $OT Visit: 1 Visit OT Evaluation $OT Eval  Moderate Complexity: 1 Mod OT Treatments $Self Care/Home Management : 8-22 mins  Rejeana Brock, MS, OTR/L ascom [phone] 10/06/20, 3:55 PM

## 2020-10-06 NOTE — TOC Initial Note (Addendum)
Transition of Care [facility]) - Initial/Assessment Note    Patient Details  Name: Kurt Blankenship MRN: [id number] Date of Birth: 05-Feb-1956  Transition of Care [facility]) CM/SW Contact:    Joseph Art, LCSWA Phone [phone] 10/06/2020, 2:50 PM  Clinical Narrative:                  Patient presents to [facility] ED due to SOB.  Patient is currently on O2 2L, as needed.  CSW explained TOC role in patient care. Patient cannot remember agency which supplies oxygen.  Patient stated he is able to perform all ADLs except driving.  His roommate Junior drives home to pick up his medications and to his doctor's appointments. Patient stated he is not interested in going to SNF facility regardless of recommendations and would like to return home with home with home health with whichever agency takes his insurance.  Patient stated he believes he needs to be on oxygen 24/7. Requested an appointment with a cardiologist at [facility] after he d/c. Main contact for pt is his brother Salih Williamson [phone], [phone] and Malone, [place] (Son) [phone].  Expected Discharge Plan: Skilled Nursing Facility Barriers to Discharge: Continued Medical Work up, ED SNF auth   Patient Goals and CMS Choice Patient states their goals for this hospitalization and ongoing recovery are:: "I don't want to go to a rehab place, I'm not ready for a nursing home. I want to go home from here.' CMS Medicare.gov Compare Post Acute Care list provided to:: Patient Choice offered to / list presented to : Patient  Expected Discharge Plan and Services Expected Discharge Plan: Skilled Nursing Facility In-house Referral: Clinical Social Work   Post Acute Care Choice: Home Health Living arrangements for the past 2 months: Single Family Home                                      Prior Living Arrangements/Services Living arrangements for the past 2 months: Single Family Home Lives with:: Roommate Patient language  and need for interpreter reviewed:: Yes Do you feel safe going back to the place where you live?: Yes      Need for Family Participation in Patient Care: No (Comment) Care giver support system in place?: No (comment)   Criminal Activity/Legal Involvement Pertinent to Current Situation/Hospitalization: No - Comment as needed  Activities of Daily Living      Permission Sought/Granted Permission sought to share information with : Facility Contact Representative Lex Linhares, brother [phone], [phone]) Permission granted to share information with : Yes, Verbal Permission Granted  Share Information with NAME: Ryen Rhames, brother [phone], [phone]        Permission granted to share info w Contact Information: [place], [place] [facility]) [phone]  Emotional Assessment Appearance:: Appears older than stated age Attitude/Demeanor/Rapport: Engaged Affect (typically observed): Stable Orientation: : Oriented to Self, Oriented to Place, Oriented to  Time, Oriented to Situation Alcohol / Substance Use: Not Applicable Psych Involvement: No (comment)  Admission diagnosis:  Acute on chronic systolic CHF (congestive heart failure) (HCC) [I50.23] Patient Active Problem List   Diagnosis Date Noted  . COPD exacerbation (HCC) 10/05/2020  . Acute on chronic systolic CHF (congestive heart failure) (HCC) 10/05/2020  . Elevated troponin 10/05/2020  . HTN (hypertension) 10/05/2020  . HLD (hyperlipidemia) 10/05/2020  . Alcohol use   . Tobacco abuse   . Cardiomyopathy (HCC)   .  Syncope 06/08/2020  . Syncope and collapse 06/08/2020  . Hypertension   . Hypokalemia   . COPD (chronic obstructive pulmonary disease) (HCC) 05/15/2016   PCP:  System, Provider Not In Pharmacy:   [id number] - [place], [place] - [address] [place] [address] Phone: [phone] Fax: [phone]  [facility] - [place], [place] - [address] [place] [address] Phone:  [phone] Fax: [phone]     Social Determinants of Health (SDOH) Interventions    Readmission Risk Interventions No flowsheet data found.

## 2020-10-06 NOTE — Progress Notes (Signed)
PT Cancellation Note  Patient Details Name: Kurt Blankenship MRN: [id number] DOB: [birth year]   Cancelled Treatment:    Reason Eval/Treat Not Completed: Other (comment). Consult received and chart reviewed. Per OT, pt independent with mobility, however does present with deconditioning. No acute PT needs identified affecting disposition. Current recommendations are for cardio/pulm rehab for endurance training and further mobility with hospital mobility specialists. Will dc orders at this time. Please re-consult if needs change.   Harrold Fitchett 10/06/2020, 1:39 PM  Elizabeth Palau, PT, DPT [phone]

## 2020-10-07 LAB — BASIC METABOLIC PANEL
Anion gap: 8 (ref 5–15)
BUN: 26 mg/dL — ABNORMAL HIGH (ref 8–23)
CO2: 32 mmol/L (ref 22–32)
Calcium: 9.4 mg/dL (ref 8.9–10.3)
Chloride: 95 mmol/L — ABNORMAL LOW (ref 98–111)
Creatinine, Ser: 1.05 mg/dL (ref 0.61–1.24)
GFR, Estimated: >60 mL/min (ref >60)
Glucose, Bld: 80 mg/dL (ref 70–99)
Potassium: 4.3 mmol/L (ref 3.5–5.1)
Sodium: 135 mmol/L (ref 135–145)

## 2020-10-07 LAB — HEMOGLOBIN A1C
Hgb A1c MFr Bld: 5.6 % (ref 4.8–5.6)
Mean Plasma Glucose: 114 mg/dL

## 2020-10-07 LAB — GLUCOSE, CAPILLARY
Glucose-Capillary: 84 mg/dL (ref 70–99)
Glucose-Capillary: 181 mg/dL — ABNORMAL HIGH (ref 70–99)

## 2020-10-07 MED ORDER — METHYLPREDNISOLONE SODIUM SUCC 40 MG IJ SOLR
40.0000 mg | Freq: Three times a day (TID) | INTRAMUSCULAR | Status: DC
  Administered 2020-10-07 – 2020-10-08 (×3): 40 mg via INTRAVENOUS
  Filled 2020-10-07 (×3): qty 1

## 2020-10-07 MED ORDER — TAMSULOSIN HCL 0.4 MG PO CAPS
0.4000 mg | ORAL_CAPSULE | Freq: Every day | ORAL | Status: DC
  Administered 2020-10-07 – 2020-10-08 (×2): 0.4 mg via ORAL
  Filled 2020-10-07 (×2): qty 1

## 2020-10-07 MED ORDER — IPRATROPIUM-ALBUTEROL 0.5-2.5 (3) MG/3ML IN SOLN
3.0000 mL | Freq: Four times a day (QID) | RESPIRATORY_TRACT | Status: DC
  Administered 2020-10-07 – 2020-10-08 (×4): 3 mL via RESPIRATORY_TRACT
  Filled 2020-10-07 (×4): qty 3

## 2020-10-07 MED ORDER — MENTHOL 3 MG MT LOZG
1.0000 | LOZENGE | OROMUCOSAL | Status: DC | PRN
  Administered 2020-10-07: 3 mg via ORAL
  Filled 2020-10-07: qty 9

## 2020-10-07 NOTE — Care Management Important Message (Signed)
Important Message  Patient Details  Name: Kurt Blankenship MRN: [id number] Date of Birth: [birth year]   Medicare Important Message Given:  N/A - LOS <3 / Initial given by admissions  Initial Medicare IM given by Patient Access Associate on 10/06/2020 at 8:16am.   Johnell Comings 10/07/2020, 8:44 AM

## 2020-10-07 NOTE — Progress Notes (Addendum)
PROGRESS NOTE    Kurt Blankenship  HYQ:[id number] DOB: 1956-01-01 DOA: 10/05/2020 PCP: System, Provider Not In    Assessment & Plan:   Principal Problem:   Acute on chronic systolic CHF (congestive heart failure) (HCC) Active Problems:   COPD exacerbation (HCC)   Elevated troponin   HTN (hypertension)   HLD (hyperlipidemia)   Alcohol use   Tobacco abuse   Acute on chronic systolic CHF exacerbation: echo on 6/20//21 showed EF of 20-25%, diastolic function is normal. Continue on IV lasix. Monitor I/Os & daily weights.   COPD exacerbation: continue on bronchodilators and encourage incentive spirometry. Started on IV steroids. Mucinex prn   Acute on chronic hypoxic respiratory failure: secondary to CHF & COPD. Continue on supplemental oxygen   Elevated troponin: likely secondary to demand ischemia  HTN: continue on coreg, losartan. IV hydralazine prn   HLD: pt is no longer taking a statin   Tobacco abuse: nicotine patch to prevent w/drawal. Smoking cessation counseling  Alcohol abuse: continue on CIWA protocol. Alcohol cessation counseling   Possible BPH: started on tamsulosin   DVT prophylaxis: lovenox  Code Status: full  Family Communication: Disposition Plan: depends on PT/OT recs   Status is: Inpatient  Remains inpatient appropriate because:Ongoing diagnostic testing needed not appropriate for outpatient work up and IV treatments appropriate due to intensity of illness or inability to take PO   Dispo: The patient is from: Home              Anticipated d/c is to: Home                Anticipated d/c date is: 3 days or 2 days               Patient currently is not medically stable to d/c.     Consultants:    Procedures:   Antimicrobials:    Subjective: Pt c/o difficulty urinating intermittently.   Objective: Vitals:   10/06/20 2342 10/07/20 0010 10/07/20 0300 10/07/20 0434  BP:  115/82    Pulse:  88    Resp:  18    Temp:  97.8 F (36.6 C)     TempSrc:  Oral    SpO2: 94% 95%  92%  Weight:   84.6 kg   Height:        Intake/Output Summary (Last 24 hours) at 10/07/2020 0754 Last data filed at 10/06/2020 1923 Gross per 24 hour  Intake 240 ml  Output 0 ml  Net 240 ml   Filed Weights   10/05/20 1212 10/06/20 1800 10/07/20 0300  Weight: 84.8 kg 83.5 kg 84.6 kg    Examination:  General exam: Appears calm and comfortable  Respiratory system: diminished breath sounds b/l. Wheezes b/l  Cardiovascular system: S1/S2+. No rubs or gallops.  Gastrointestinal system: Abdomen is nondistended, soft and nontender.  Normal bowel sounds heard. Central nervous system: Alert and oriented. Moves all 4 extremities  Psychiatry: Judgement and insight appear normal. Flat mood and affect     Data Reviewed: I have personally reviewed following labs and imaging studies  CBC: Recent Labs  Lab 10/05/20 1217 10/06/20 0409  WBC 9.2 7.4  HGB 15.4 14.5  HCT 45.2 42.5  MCV 101.6* 103.2*  PLT 219 188   Basic Metabolic Panel: Recent Labs  Lab 10/05/20 1217 10/06/20 0409 10/07/20 0509  NA 136 132* 135  K 3.7 4.3 4.3  CL 99 95* 95*  CO2 27 29 32  GLUCOSE 100* 175* 80  BUN  15 18 26*  CREATININE 0.75 0.85 1.05  CALCIUM 9.1 9.0 9.4  MG  --  2.1  --    GFR: Estimated Creatinine Clearance: 72 mL/min (by C-G formula based on SCr of 1.05 mg/dL). Liver Function Tests: No results for input(s): AST, ALT, ALKPHOS, BILITOT, PROT, ALBUMIN in the last 168 hours. No results for input(s): LIPASE, AMYLASE in the last 168 hours. No results for input(s): AMMONIA in the last 168 hours. Coagulation Profile: Recent Labs  Lab 10/05/20 1857  INR 1.1   Cardiac Enzymes: No results for input(s): CKTOTAL, CKMB, CKMBINDEX, TROPONINI in the last 168 hours. BNP (last 3 results) No results for input(s): PROBNP in the last 8760 hours. HbA1C: Recent Labs    10/06/20 0409  HGBA1C 5.6   CBG: No results for input(s): GLUCAP in the last 168  hours. Lipid Profile: Recent Labs    10/06/20 0409  CHOL 123  HDL 41  LDLCALC 73  TRIG 46  CHOLHDL 3.0   Thyroid Function Tests: No results for input(s): TSH, T4TOTAL, FREET4, T3FREE, THYROIDAB in the last 72 hours. Anemia Panel: No results for input(s): VITAMINB12, FOLATE, FERRITIN, TIBC, IRON, RETICCTPCT in the last 72 hours. Sepsis Labs: Recent Labs  Lab 10/05/20 1221  PROCALCITON <0.10    Recent Results (from the past 240 hour(s))  Respiratory Panel by RT PCR (Flu A&B, Covid) - Nasopharyngeal Swab     Status: None   Collection Time: 10/05/20  1:25 PM   Specimen: Nasopharyngeal Swab  Result Value Ref Range Status   SARS Coronavirus 2 by RT PCR NEGATIVE NEGATIVE Final    Comment: (NOTE) SARS-CoV-2 target nucleic acids are NOT DETECTED.  The SARS-CoV-2 RNA is generally detectable in upper respiratoy specimens during the acute phase of infection. The lowest concentration of SARS-CoV-2 viral copies this assay can detect is 131 copies/mL. A negative result does not preclude SARS-Cov-2 infection and should not be used as the sole basis for treatment or other patient management decisions. A negative result may occur with  improper specimen collection/handling, submission of specimen other than nasopharyngeal swab, presence of viral mutation(s) within the areas targeted by this assay, and inadequate number of viral copies (<131 copies/mL). A negative result must be combined with clinical observations, patient history, and epidemiological information. The expected result is Negative.  Fact Sheet for Patients:  https://www.moore.com/  Fact Sheet for Healthcare Providers:  https://www.young.biz/  This test is no t yet approved or cleared by the [place] FDA and  has been authorized for detection and/or diagnosis of SARS-CoV-2 by FDA under an Emergency Use Authorization (EUA). This EUA will remain  in effect (meaning this test can  be used) for the duration of the COVID-19 declaration under Section 564(b)(1) of the Act, 21 U.S.C. section 360bbb-3(b)(1), unless the authorization is terminated or revoked sooner.     Influenza A by PCR NEGATIVE NEGATIVE Final   Influenza B by PCR NEGATIVE NEGATIVE Final    Comment: (NOTE) The Xpert Xpress SARS-CoV-2/FLU/RSV assay is intended as an aid in  the diagnosis of influenza from Nasopharyngeal swab specimens and  should not be used as a sole basis for treatment. Nasal washings and  aspirates are unacceptable for Xpert Xpress SARS-CoV-2/FLU/RSV  testing.  Fact Sheet for Patients: https://www.moore.com/  Fact Sheet for Healthcare Providers: https://www.young.biz/  This test is not yet approved or cleared by the [place] FDA and  has been authorized for detection and/or diagnosis of SARS-CoV-2 by  FDA under an Emergency Use Authorization (  EUA). This EUA will remain  in effect (meaning this test can be used) for the duration of the  Covid-19 declaration under Section 564(b)(1) of the Act, 21  U.S.C. section 360bbb-3(b)(1), unless the authorization is  terminated or revoked. Performed at [facility], [address] [place], [place] [address]          Radiology Studies: DG Chest 2 View  Result Date: 10/05/2020 CLINICAL DATA:  Shortness of breath. EXAM: CHEST - 2 VIEW COMPARISON:  May 07, 2020 FINDINGS: Borderline enlargement the cardiac silhouette. New diffuse interstitial opacities with patchy airspace opacities, greatest at the right lung base. Suspect small bilateral pleural effusions with fluid tracking along the right major fissure. No discernible pneumothorax. No acute osseous abnormality. IMPRESSION: 1. New diffuse interstitial opacities with patchy airspace opacities, compatible with edema and/or infection. 2. Suspect small bilateral pleural effusions with fluid tracking along the right major fissure. 3.  Borderline cardiomegaly. Electronically Signed   By: Feliberto Harts MD   On: 10/05/2020 13:01   DG Abd Portable 1V  Result Date: 10/06/2020 CLINICAL DATA:  Abdominal pain. EXAM: PORTABLE ABDOMEN - 1 VIEW COMPARISON:  09/24/2004 CT abdomen pelvis. FINDINGS: The bowel gas pattern is normal. Mild stool burden. No radio-opaque calculi. Lumbar spondylosis. Findings above the diaphragm are better characterized on recent chest radiograph. IMPRESSION: Nonobstructive bowel gas pattern.  Mild stool burden. Electronically Signed   By: Stana Bunting M.D.   On: 10/06/2020 11:27   ECHOCARDIOGRAM COMPLETE  Result Date: 10/06/2020    ECHOCARDIOGRAM REPORT   Patient Name:   Kurt Blankenship Date of Exam: 10/06/2020 Medical Rec #:  [id number]      Height:       69.0 in Accession #:    [id number]     Weight:       187.0 lb Date of Birth:  1956/07/18       BSA:          2.008 m Patient Age:    64 years       BP:           104/87 mmHg Patient Gender: M              HR:           89 bpm. Exam Location:  [facility] Procedure: 2D Echo, Color Doppler, Cardiac Doppler and Intracardiac            Opacification Agent Indications:     I50.21 CHF-Acute Systolic  History:         Patient has prior history of Echocardiogram examinations, most                  recent 06/08/2020. CHF, COPD; Risk Factors:Current Smoker and                  Hypertension.  Sonographer:     Humphrey Rolls RDCS (AE) Referring Phys:  [id number] Brien Few NIU Diagnosing Phys: Julien Nordmann MD  Sonographer Comments: Suboptimal parasternal window. Image acquisition challenging due to COPD. IMPRESSIONS  1. Left ventricular ejection fraction, by estimation, is 20 to 25%. The left ventricle has severely decreased function. The left ventricle has no regional wall motion abnormalities. The left ventricular internal cavity size was moderately dilated. Left ventricular diastolic parameters were normal.  2. Right ventricular systolic function is moderately reduced. The right ventricular  size is normal. Tricuspid regurgitation signal is inadequate for assessing PA pressure.  3. Mild mitral valve regurgitation.  4. The inferior  vena cava is dilated in size with >50% respiratory variability, suggesting right atrial pressure of 8 mmHg. FINDINGS  Left Ventricle: Left ventricular ejection fraction, by estimation, is 20 to 25%. The left ventricle has severely decreased function. The left ventricle has no regional wall motion abnormalities. Definity contrast agent was given IV to delineate the left  ventricular endocardial borders. The left ventricular internal cavity size was moderately dilated. There is no left ventricular hypertrophy. Left ventricular diastolic parameters were normal. Right Ventricle: The right ventricular size is normal. No increase in right ventricular wall thickness. Right ventricular systolic function is moderately reduced. Tricuspid regurgitation signal is inadequate for assessing PA pressure. Left Atrium: Left atrial size was normal in size. Right Atrium: Right atrial size was normal in size. Pericardium: There is no evidence of pericardial effusion. Mitral Valve: The mitral valve is normal in structure. Mild mitral valve regurgitation. No evidence of mitral valve stenosis. MV peak gradient, 2.6 mmHg. The mean mitral valve gradient is 1.0 mmHg. Tricuspid Valve: The tricuspid valve is normal in structure. Tricuspid valve regurgitation is mild . No evidence of tricuspid stenosis. Aortic Valve: The aortic valve is normal in structure. Aortic valve regurgitation is not visualized. No aortic stenosis is present. Aortic valve mean gradient measures 1.0 mmHg. Aortic valve peak gradient measures 2.2 mmHg. Aortic valve area, by VTI measures 2.77 cm. Pulmonic Valve: The pulmonic valve was normal in structure. Pulmonic valve regurgitation is not visualized. No evidence of pulmonic stenosis. Aorta: The aortic root is normal in size and structure. Venous: The inferior vena cava is dilated in  size with greater than 50% respiratory variability, suggesting right atrial pressure of 8 mmHg. IAS/Shunts: No atrial level shunt detected by color flow Doppler.  LEFT VENTRICLE PLAX 2D LVIDd:         5.87 cm  Diastology LVIDs:         5.29 cm  LV e' medial:    6.53 cm/s LV PW:         1.01 cm  LV E/e' medial:  12.7 LV IVS:        0.86 cm  LV e' lateral:   10.20 cm/s LVOT diam:     2.30 cm  LV E/e' lateral: 8.1 LV SV:         32 LV SV Index:   16 LVOT Area:     4.15 cm  RIGHT VENTRICLE RV Basal diam:  3.38 cm LEFT ATRIUM             Index       RIGHT ATRIUM           Index LA diam:        4.10 cm 2.04 cm/m  RA Area:     18.10 cm LA Vol (A2C):   46.7 ml 23.26 ml/m RA Volume:   44.20 ml  22.01 ml/m LA Vol (A4C):   64.5 ml 32.12 ml/m LA Biplane Vol: 60.1 ml 29.93 ml/m  AORTIC VALVE AV Area (Vmax):    2.96 cm AV Area (Vmean):   2.85 cm AV Area (VTI):     2.77 cm AV Vmax:           75.00 cm/s AV Vmean:          53.500 cm/s AV VTI:            0.117 m AV Peak Grad:      2.2 mmHg AV Mean Grad:      1.0 mmHg LVOT Vmax:  53.50 cm/s LVOT Vmean:        36.700 cm/s LVOT VTI:          0.078 m LVOT/AV VTI ratio: 0.67  AORTA Ao Root diam: 3.60 cm MITRAL VALVE MV Area (PHT): 5.90 cm    SHUNTS MV Peak grad:  2.6 mmHg    Systemic VTI:  0.08 m MV Mean grad:  1.0 mmHg    Systemic Diam: 2.30 cm MV Vmax:       0.80 m/s MV Vmean:      49.4 cm/s MV Decel Time: 129 msec MV E velocity: 82.90 cm/s MV A velocity: 45.20 cm/s MV E/A ratio:  1.83 Julien Nordmann MD Electronically signed by Julien Nordmann MD Signature Date/Time: 10/06/2020/5:20:12 PM    Final         Scheduled Meds: . aspirin EC  81 mg Oral Daily  . carvedilol  3.125 mg Oral BID WC  . enoxaparin (LOVENOX) injection  40 mg Subcutaneous Q24H  . folic acid  1 mg Oral Daily  . furosemide  40 mg Intravenous Q12H  . gabapentin  300 mg Oral BID  . ipratropium-albuterol  3 mL Nebulization Q4H  . LORazepam  0-4 mg Intravenous Q6H   Followed by  . LORazepam   0-4 mg Intravenous Q12H  . losartan  25 mg Oral Daily  . multivitamin with minerals  1 tablet Oral Daily  . nicotine  21 mg Transdermal Daily  . oxyCODONE  10 mg Oral Q6H  . sodium chloride flush  3 mL Intravenous Q12H  . thiamine  100 mg Oral Daily   Or  . thiamine  100 mg Intravenous Daily   Continuous Infusions: . sodium chloride       LOS: 2 days    Time spent: 33 mins     Charise Killian, MD [facility] Pager [phone]  If 7PM-7AM, please contact night-coverage [email or web address] 10/07/2020, 7:54 AM

## 2020-10-07 NOTE — TOC Progression Note (Addendum)
Transition of Care [facility]) - Progression Note    Patient Details  Name: Kurt Blankenship MRN: [id number] Date of Birth: [birth year]  [facility] CM/SW Contact  Maree Krabbe, LCSW Phone Number: 10/07/2020, 2:20 PM  Clinical Narrative:   CSW spoke with pt regarding HH and 02. Pt states he has 02 through [employer] however states he is out of it at home. Pt is requesting a nebulizer and more 02.Marland Kitchen Pt is agreeable to [facility] and does not have agency preference. Pt states he has no equipment at home--pt would like a walker and a power chair--PCP will have to evaluate for Power Chair in order for insurance to cover it.  [employer] notified reguarding 02 and walker, and referral made to [facility] for [facility].   Expected Discharge Plan: Skilled Nursing Facility Barriers to Discharge: Continued Medical Work up, ED SNF auth  Expected Discharge Plan and Services Expected Discharge Plan: Skilled Nursing Facility In-house Referral: Clinical Social Work   Post Acute Care Choice: Home Health Living arrangements for the past 2 months: Single Family Home                                       Social Determinants of Health (SDOH) Interventions    Readmission Risk Interventions No flowsheet data found.

## 2020-10-07 NOTE — Progress Notes (Signed)
Mobility Specialist - Progress Note   10/07/20 1700  Mobility  Activity Ambulated in hall  Level of Assistance Independent  Assistive Device None  Distance Ambulated (ft) 220 ft  Mobility Response Tolerated well  Mobility performed by Mobility specialist  $Mobility charge 1 Mobility    Pre-mobility: 94 HR, 97% SpO2 During mobility: 95 HR, 98% SpO2 Post-mobility: 97 HR, 94% SpO2   Pt was lying in bed upon arrival utilizing 4L [place] O2. Pt agreed to session. Pt denied any pain, nausea, or fatigue. Pt was very pleasant and conversational. Pt was independent in all transfers this date including ambulation. Pt was able to ambulate 220' in room/hallway with no LOB. Pt denied SOB, O2 maintained > 95% throughout ambulation. Upon return to room, pt removed [place] and O2 desat to 89%. Pt was instructed to reapply and utilize [place] until discontinued by physician. Overall, pt tolerated session well. Pt was left EOB with all needs in reach.    Filiberto Pinks Mobility Specialist 10/07/20, 5:10 PM

## 2020-10-08 LAB — CBC
HCT: 40.7 % (ref 39.0–52.0)
Hemoglobin: 14.2 g/dL (ref 13.0–17.0)
MCH: 35.1 pg — ABNORMAL HIGH (ref 26.0–34.0)
MCHC: 34.9 g/dL (ref 30.0–36.0)
MCV: 100.7 fL — ABNORMAL HIGH (ref 80.0–100.0)
Platelets: 180 10*3/uL (ref 150–400)
RBC: 4.04 MIL/uL — ABNORMAL LOW (ref 4.22–5.81)
RDW: 12.8 % (ref 11.5–15.5)
WBC: 6.3 10*3/uL (ref 4.0–10.5)
nRBC: 0 % (ref 0.0–0.2)

## 2020-10-08 LAB — BASIC METABOLIC PANEL
Anion gap: 9 (ref 5–15)
BUN: 27 mg/dL — ABNORMAL HIGH (ref 8–23)
CO2: 29 mmol/L (ref 22–32)
Calcium: 9 mg/dL (ref 8.9–10.3)
Chloride: 94 mmol/L — ABNORMAL LOW (ref 98–111)
Creatinine, Ser: 0.71 mg/dL (ref 0.61–1.24)
GFR, Estimated: >60 mL/min (ref >60)
Glucose, Bld: 194 mg/dL — ABNORMAL HIGH (ref 70–99)
Potassium: 4.2 mmol/L (ref 3.5–5.1)
Sodium: 132 mmol/L — ABNORMAL LOW (ref 135–145)

## 2020-10-08 NOTE — TOC Transition Note (Signed)
Transition of Care [facility]) - CM/SW Discharge Note   Patient Details  Name: Kurt Blankenship MRN: [id number] Date of Birth: January 25, 1956  Transition of Care [facility]) CM/SW Contact:  Dominic Pea, LCSW Phone Number: 10/08/2020, 11:09 AM   Clinical Narrative:    Patient left AMA. LCSW spoke with [employer] rep to assure O2 and other equipment was delivered to the home. Also, left a voicemail for [place] with [facility] to notify patient no longer in the hospital. DME and [facility] arranged yesterday. TOC consult closed.   Final next level of care: Home w Home Health Services Barriers to Discharge: Patient left Against Medical Advice [facility])   Patient Goals and CMS Choice Patient states their goals for this hospitalization and ongoing recovery are:: "I don't want to go to a rehab place, I'm not ready for a nursing home. I want to go home from here.' CMS Medicare.gov Compare Post Acute Care list provided to:: Patient Choice offered to / list presented to : Patient  Discharge Placement                  Name of family member notified: Pt left AMA    Discharge Plan and Services In-house Referral: Clinical Social Work   Post Acute Care Choice: Home Health            DME Agency: [employer] Date DME Agency Contacted: 10/08/20 Time DME Agency Contacted: 1049 Representative spoke with at DME Agency: [phone]   [facility] Agency: [facility] Date [facility] Agency Contacted: 10/08/20 Time HH Agency Contacted: 1103 Representative spoke with at [facility] Agency: Brittany-Left VM  Social Determinants of Health (SDOH) Interventions     Readmission Risk Interventions No flowsheet data found.

## 2020-10-08 NOTE — Discharge Summary (Signed)
Physician Discharge Summary  Kurt Blankenship ZDG:[id number] DOB: [birth year] DOA: 10/05/2020  PCP: System, Provider Not In  Admit date: 10/05/2020 Discharge date: 10/08/2020  Admitted From: home  Disposition:  Pt left AMA   Recommendations for Outpatient Follow-up:  1. Pt left AMA   Home Health: CM was trying to set this up  Equipment/Devices: nebulizer  Discharge Condition: guarded  CODE STATUS: full  Diet recommendation: Heart Healthy    Brief/Interim Summary: HPI was taken from Dr. Clyde Lundborg: Kurt Blankenship is a 64 y.o. male with medical history significant of sCHF with EF of 20-25%, hypertension, hyperlipidemia, COPD on 1-2 L oxygen at home, tobacco abuse, alcohol abuse, back pain, gout, who presents with shortness breath.  Patient states that he has been short of breath in the past several days, which has worsened since last night.  Patient has orthopnea, and bilateral leg edema. He had some chest pain earlier, which has resolved.  Currently no chest pain, fever or chills.  Patient has dry cough.  He states that he has gained 20 pounds of body weight recently.  No nausea, vomiting, diarrhea or abdominal pain, no symptoms of UTI or unilateral weakness.   ED Course: pt was found to have BNP 1305, troponin level 61, negative Covid PCR, electrolytes renal function okay, WBC 9.2, electrolytes renal function okay, blood pressure 132/101, heart rate 101, RR 21, oxygen saturation 94% on 2 L oxygen, chest x-ray showed interstitial edema and possible bilateral small pleural effusion.  Patient is admitted to progressive bed as inpatient.  Hospital Course from Dr. Wilfred Lacy 10/21-10/23/21: Pt presented w/ shortness of breath which found secondary to CHF and COPD exacerbation. Pt was treated w/ IV lasix, IV steroids, bronchodilators, incentive spirometry and supplemental oxygen. Pt decided to leave AMA and before his supplemental oxygen was set up but evidently as per CM the oxygen will still be  delivered to his house.   Discharge Diagnoses:  Principal Problem:   Acute on chronic systolic CHF (congestive heart failure) (HCC) Active Problems:   COPD exacerbation (HCC)   Elevated troponin   HTN (hypertension)   HLD (hyperlipidemia)   Alcohol use   Tobacco abuse Acute on chronic systolic CHF exacerbation: echo on 6/20//21 showed EF of 20-25%, diastolic function is normal. Continue on IV lasix. Monitor I/Os & daily weights.   COPD exacerbation: continue on bronchodilators and encourage incentive spirometry. Started on IV steroids. Mucinex prn   Acute on chronic hypoxic respiratory failure: secondary to CHF & COPD. Continue on supplemental oxygen   Elevated troponin: likely secondary to demand ischemia  HTN: continue on coreg, losartan. IV hydralazine prn   HLD: pt is no longer taking a statin   Tobacco abuse: nicotine patch to prevent w/drawal. Smoking cessation counseling  Alcohol abuse: continue on CIWA protocol. Alcohol cessation counseling   Possible BPH: started on tamsulosin    Discharge Instructions     Follow-up Information    [facility] Follow up on 10/14/2020.   Specialty: Cardiology Why: at 12:00pm. Enter through the [facility] entrance Contact information: [address] [place] [address] [phone]             No Known Allergies  Consultations:     Procedures/Studies: DG Chest 2 View  Result Date: 10/05/2020 CLINICAL DATA:  Shortness of breath. EXAM: CHEST - 2 VIEW COMPARISON:  May 07, 2020 FINDINGS: Borderline enlargement the cardiac silhouette. New diffuse interstitial opacities with patchy airspace  opacities, greatest at the right lung base. Suspect small bilateral pleural effusions with fluid tracking along the right major fissure. No discernible pneumothorax. No acute osseous abnormality. IMPRESSION: 1. New diffuse interstitial opacities with patchy  airspace opacities, compatible with edema and/or infection. 2. Suspect small bilateral pleural effusions with fluid tracking along the right major fissure. 3. Borderline cardiomegaly. Electronically Signed   By: Feliberto Harts MD   On: 10/05/2020 13:01   DG Abd Portable 1V  Result Date: 10/06/2020 CLINICAL DATA:  Abdominal pain. EXAM: PORTABLE ABDOMEN - 1 VIEW COMPARISON:  09/24/2004 CT abdomen pelvis. FINDINGS: The bowel gas pattern is normal. Mild stool burden. No radio-opaque calculi. Lumbar spondylosis. Findings above the diaphragm are better characterized on recent chest radiograph. IMPRESSION: Nonobstructive bowel gas pattern.  Mild stool burden. Electronically Signed   By: Stana Bunting M.D.   On: 10/06/2020 11:27   ECHOCARDIOGRAM COMPLETE  Result Date: 10/06/2020    ECHOCARDIOGRAM REPORT   Patient Name:   Kurt Blankenship Date of Exam: 10/06/2020 Medical Rec #:  [id number]      Height:       69.0 in Accession #:    [id number]     Weight:       187.0 lb Date of Birth:  [birth year]       BSA:          2.008 m Patient Age:    64 years       BP:           104/87 mmHg Patient Gender: M              HR:           89 bpm. Exam Location:  [facility] Procedure: 2D Echo, Color Doppler, Cardiac Doppler and Intracardiac            Opacification Agent Indications:     I50.21 CHF-Acute Systolic  History:         Patient has prior history of Echocardiogram examinations, most                  recent 06/08/2020. CHF, COPD; Risk Factors:Current Smoker and                  Hypertension.  Sonographer:     Humphrey Rolls RDCS (AE) Referring Phys:  [id number] Brien Few NIU Diagnosing Phys: Julien Nordmann MD  Sonographer Comments: Suboptimal parasternal window. Image acquisition challenging due to COPD. IMPRESSIONS  1. Left ventricular ejection fraction, by estimation, is 20 to 25%. The left ventricle has severely decreased function. The left ventricle has no regional wall motion abnormalities. The left ventricular internal cavity size  was moderately dilated. Left ventricular diastolic parameters were normal.  2. Right ventricular systolic function is moderately reduced. The right ventricular size is normal. Tricuspid regurgitation signal is inadequate for assessing PA pressure.  3. Mild mitral valve regurgitation.  4. The inferior vena cava is dilated in size with >50% respiratory variability, suggesting right atrial pressure of 8 mmHg. FINDINGS  Left Ventricle: Left ventricular ejection fraction, by estimation, is 20 to 25%. The left ventricle has severely decreased function. The left ventricle has no regional wall motion abnormalities. Definity contrast agent was given IV to delineate the left  ventricular endocardial borders. The left ventricular internal cavity size was moderately dilated. There is no left ventricular hypertrophy. Left ventricular diastolic parameters were normal. Right Ventricle: The right ventricular size is normal. No increase in right ventricular wall thickness. Right ventricular systolic function  is moderately reduced. Tricuspid regurgitation signal is inadequate for assessing PA pressure. Left Atrium: Left atrial size was normal in size. Right Atrium: Right atrial size was normal in size. Pericardium: There is no evidence of pericardial effusion. Mitral Valve: The mitral valve is normal in structure. Mild mitral valve regurgitation. No evidence of mitral valve stenosis. MV peak gradient, 2.6 mmHg. The mean mitral valve gradient is 1.0 mmHg. Tricuspid Valve: The tricuspid valve is normal in structure. Tricuspid valve regurgitation is mild . No evidence of tricuspid stenosis. Aortic Valve: The aortic valve is normal in structure. Aortic valve regurgitation is not visualized. No aortic stenosis is present. Aortic valve mean gradient measures 1.0 mmHg. Aortic valve peak gradient measures 2.2 mmHg. Aortic valve area, by VTI measures 2.77 cm. Pulmonic Valve: The pulmonic valve was normal in structure. Pulmonic valve  regurgitation is not visualized. No evidence of pulmonic stenosis. Aorta: The aortic root is normal in size and structure. Venous: The inferior vena cava is dilated in size with greater than 50% respiratory variability, suggesting right atrial pressure of 8 mmHg. IAS/Shunts: No atrial level shunt detected by color flow Doppler.  LEFT VENTRICLE PLAX 2D LVIDd:         5.87 cm  Diastology LVIDs:         5.29 cm  LV e' medial:    6.53 cm/s LV PW:         1.01 cm  LV E/e' medial:  12.7 LV IVS:        0.86 cm  LV e' lateral:   10.20 cm/s LVOT diam:     2.30 cm  LV E/e' lateral: 8.1 LV SV:         32 LV SV Index:   16 LVOT Area:     4.15 cm  RIGHT VENTRICLE RV Basal diam:  3.38 cm LEFT ATRIUM             Index       RIGHT ATRIUM           Index LA diam:        4.10 cm 2.04 cm/m  RA Area:     18.10 cm LA Vol (A2C):   46.7 ml 23.26 ml/m RA Volume:   44.20 ml  22.01 ml/m LA Vol (A4C):   64.5 ml 32.12 ml/m LA Biplane Vol: 60.1 ml 29.93 ml/m  AORTIC VALVE AV Area (Vmax):    2.96 cm AV Area (Vmean):   2.85 cm AV Area (VTI):     2.77 cm AV Vmax:           75.00 cm/s AV Vmean:          53.500 cm/s AV VTI:            0.117 m AV Peak Grad:      2.2 mmHg AV Mean Grad:      1.0 mmHg LVOT Vmax:         53.50 cm/s LVOT Vmean:        36.700 cm/s LVOT VTI:          0.078 m LVOT/AV VTI ratio: 0.67  AORTA Ao Root diam: 3.60 cm MITRAL VALVE MV Area (PHT): 5.90 cm    SHUNTS MV Peak grad:  2.6 mmHg    Systemic VTI:  0.08 m MV Mean grad:  1.0 mmHg    Systemic Diam: 2.30 cm MV Vmax:       0.80 m/s MV Vmean:      49.4 cm/s  MV Decel Time: 129 msec MV E velocity: 82.90 cm/s MV A velocity: 45.20 cm/s MV E/A ratio:  1.83 Julien Nordmann MD Electronically signed by Julien Nordmann MD Signature Date/Time: 10/06/2020/5:20:12 PM    Final        Subjective: Pt c/o shortness of breath    Discharge Exam: Vitals:   10/08/20 0311 10/08/20 0801  BP: 104/72 (!) 126/98  Pulse: 90 92  Resp: 20 18  Temp: 98 F (36.7 C) 97.6 F (36.4 C)   SpO2: 95% 96%   Vitals:   10/08/20 0022 10/08/20 0231 10/08/20 0311 10/08/20 0801  BP: (!) 131/93  104/72 (!) 126/98  Pulse: 98  90 92  Resp: 20  20 18   Temp: 97.7 F (36.5 C)  98 F (36.7 C) 97.6 F (36.4 C)  TempSrc: Oral  Oral   SpO2: 94% 93% 95% 96%  Weight:   84.8 kg   Height:        General: Pt is alert, awake, not in acute distress Cardiovascular: S1/S2 +, no rubs, no gallops Respiratory: diminished breath sounds b/l  Abdominal: Soft, NT, ND, bowel sounds + Extremities: no edema, no cyanosis    The results of significant diagnostics from this hospitalization (including imaging, microbiology, ancillary and laboratory) are listed below for reference.     Microbiology: Recent Results (from the past 240 hour(s))  Respiratory Panel by RT PCR (Flu A&B, Covid) - Nasopharyngeal Swab     Status: None   Collection Time: 10/05/20  1:25 PM   Specimen: Nasopharyngeal Swab  Result Value Ref Range Status   SARS Coronavirus 2 by RT PCR NEGATIVE NEGATIVE Final    Comment: (NOTE) SARS-CoV-2 target nucleic acids are NOT DETECTED.  The SARS-CoV-2 RNA is generally detectable in upper respiratoy specimens during the acute phase of infection. The lowest concentration of SARS-CoV-2 viral copies this assay can detect is 131 copies/mL. A negative result does not preclude SARS-Cov-2 infection and should not be used as the sole basis for treatment or other patient management decisions. A negative result may occur with  improper specimen collection/handling, submission of specimen other than nasopharyngeal swab, presence of viral mutation(s) within the areas targeted by this assay, and inadequate number of viral copies (<131 copies/mL). A negative result must be combined with clinical observations, patient history, and epidemiological information. The expected result is Negative.  Fact Sheet for Patients:  10/07/20  Fact Sheet for Healthcare  Providers:  https://www.moore.com/  This test is no t yet approved or cleared by the https://www.young.biz/ FDA and  has been authorized for detection and/or diagnosis of SARS-CoV-2 by FDA under an Emergency Use Authorization (EUA). This EUA will remain  in effect (meaning this test can be used) for the duration of the COVID-19 declaration under Section 564(b)(1) of the Act, 21 U.S.C. section 360bbb-3(b)(1), unless the authorization is terminated or revoked sooner.     Influenza A by PCR NEGATIVE NEGATIVE Final   Influenza B by PCR NEGATIVE NEGATIVE Final    Comment: (NOTE) The Xpert Xpress SARS-CoV-2/FLU/RSV assay is intended as an aid in  the diagnosis of influenza from Nasopharyngeal swab specimens and  should not be used as a sole basis for treatment. Nasal washings and  aspirates are unacceptable for Xpert Xpress SARS-CoV-2/FLU/RSV  testing.  Fact Sheet for Patients: [place]  Fact Sheet for Healthcare Providers: https://www.moore.com/  This test is not yet approved or cleared by the https://www.young.biz/ FDA and  has been authorized for detection and/or diagnosis of SARS-CoV-2  by  FDA under an Emergency Use Authorization (EUA). This EUA will remain  in effect (meaning this test can be used) for the duration of the  Covid-19 declaration under Section 564(b)(1) of the Act, 21  U.S.C. section 360bbb-3(b)(1), unless the authorization is  terminated or revoked. Performed at [facility], [address] [place], [place] [address]      Labs: BNP (last 3 results) Recent Labs    05/07/20 0629 10/05/20 1221  BNP 254.9* 1,305.7*   Basic Metabolic Panel: Recent Labs  Lab 10/05/20 1217 10/06/20 0409 10/07/20 0509 10/08/20 0411  NA 136 132* 135 132*  K 3.7 4.3 4.3 4.2  CL 99 95* 95* 94*  CO2 27 29 32 29  GLUCOSE 100* 175* 80 194*  BUN 15 18 26* 27*  CREATININE 0.75 0.85 1.05 0.71  CALCIUM 9.1 9.0 9.4  9.0  MG  --  2.1  --   --    Liver Function Tests: No results for input(s): AST, ALT, ALKPHOS, BILITOT, PROT, ALBUMIN in the last 168 hours. No results for input(s): LIPASE, AMYLASE in the last 168 hours. No results for input(s): AMMONIA in the last 168 hours. CBC: Recent Labs  Lab 10/05/20 1217 10/06/20 0409 10/08/20 0411  WBC 9.2 7.4 6.3  HGB 15.4 14.5 14.2  HCT 45.2 42.5 40.7  MCV 101.6* 103.2* 100.7*  PLT 219 188 180   Cardiac Enzymes: No results for input(s): CKTOTAL, CKMB, CKMBINDEX, TROPONINI in the last 168 hours. BNP: Invalid input(s): POCBNP CBG: Recent Labs  Lab 10/07/20 1115 10/07/20 1626  GLUCAP 84 181*   D-Dimer No results for input(s): DDIMER in the last 72 hours. Hgb A1c Recent Labs    10/06/20 0409  HGBA1C 5.6   Lipid Profile Recent Labs    10/06/20 0409  CHOL 123  HDL 41  LDLCALC 73  TRIG 46  CHOLHDL 3.0   Thyroid function studies No results for input(s): TSH, T4TOTAL, T3FREE, THYROIDAB in the last 72 hours.  Invalid input(s): FREET3 Anemia work up No results for input(s): VITAMINB12, FOLATE, FERRITIN, TIBC, IRON, RETICCTPCT in the last 72 hours. Urinalysis    Component Value Date/Time   COLORURINE AMBER (A) 02/25/2019 1203   APPEARANCEUR CLEAR (A) 02/25/2019 1203   APPEARANCEUR Clear 11/09/2012 1801   LABSPEC 1.029 02/25/2019 1203   LABSPEC 1.005 11/09/2012 1801   PHURINE 6.0 02/25/2019 1203   GLUCOSEU NEGATIVE 02/25/2019 1203   GLUCOSEU Negative 11/09/2012 1801   HGBUR NEGATIVE 02/25/2019 1203   BILIRUBINUR NEGATIVE 02/25/2019 1203   BILIRUBINUR Negative 11/09/2012 1801   KETONESUR 20 (A) 02/25/2019 1203   PROTEINUR 100 (A) 02/25/2019 1203   NITRITE NEGATIVE 02/25/2019 1203   LEUKOCYTESUR NEGATIVE 02/25/2019 1203   LEUKOCYTESUR Negative 11/09/2012 1801   Sepsis Labs Invalid input(s): PROCALCITONIN,  WBC,  LACTICIDVEN Microbiology Recent Results (from the past 240 hour(s))  Respiratory Panel by RT PCR (Flu A&B, Covid) -  Nasopharyngeal Swab     Status: None   Collection Time: 10/05/20  1:25 PM   Specimen: Nasopharyngeal Swab  Result Value Ref Range Status   SARS Coronavirus 2 by RT PCR NEGATIVE NEGATIVE Final    Comment: (NOTE) SARS-CoV-2 target nucleic acids are NOT DETECTED.  The SARS-CoV-2 RNA is generally detectable in upper respiratoy specimens during the acute phase of infection. The lowest concentration of SARS-CoV-2 viral copies this assay can detect is 131 copies/mL. A negative result does not preclude SARS-Cov-2 infection and should not be used as the sole basis for  treatment or other patient management decisions. A negative result may occur with  improper specimen collection/handling, submission of specimen other than nasopharyngeal swab, presence of viral mutation(s) within the areas targeted by this assay, and inadequate number of viral copies (<131 copies/mL). A negative result must be combined with clinical observations, patient history, and epidemiological information. The expected result is Negative.  Fact Sheet for Patients:  https://www.moore.com/  Fact Sheet for Healthcare Providers:  https://www.young.biz/  This test is no t yet approved or cleared by the [place] FDA and  has been authorized for detection and/or diagnosis of SARS-CoV-2 by FDA under an Emergency Use Authorization (EUA). This EUA will remain  in effect (meaning this test can be used) for the duration of the COVID-19 declaration under Section 564(b)(1) of the Act, 21 U.S.C. section 360bbb-3(b)(1), unless the authorization is terminated or revoked sooner.     Influenza A by PCR NEGATIVE NEGATIVE Final   Influenza B by PCR NEGATIVE NEGATIVE Final    Comment: (NOTE) The Xpert Xpress SARS-CoV-2/FLU/RSV assay is intended as an aid in  the diagnosis of influenza from Nasopharyngeal swab specimens and  should not be used as a sole basis for treatment. Nasal washings and   aspirates are unacceptable for Xpert Xpress SARS-CoV-2/FLU/RSV  testing.  Fact Sheet for Patients: https://www.moore.com/  Fact Sheet for Healthcare Providers: https://www.young.biz/  This test is not yet approved or cleared by the [place] FDA and  has been authorized for detection and/or diagnosis of SARS-CoV-2 by  FDA under an Emergency Use Authorization (EUA). This EUA will remain  in effect (meaning this test can be used) for the duration of the  Covid-19 declaration under Section 564(b)(1) of the Act, 21  U.S.C. section 360bbb-3(b)(1), unless the authorization is  terminated or revoked. Performed at [facility], [address], [place], [place] [address]      Time coordinating discharge: Over 30 minutes  SIGNED:   Charise Killian, MD  [facility] 10/08/2020, 12:56 PM Pager   If 7PM-7AM, please contact night-coverage [email or web address]

## 2020-10-08 NOTE — Progress Notes (Signed)
Patient came to front desk stating he was leaving hospital. This RN went to room, explained results of leaving AMA to patient. Only thing pending his discharge was home health delivering oxygen to his home. He informed me he would take care of it himself. I removed his IV and had him fill out his AMA paperwork. This RN accompanied patient to elevators. He said he had a ride picking him up at the emergency room entrance. Patient took all his belongings from the room. Tele box was returned to Diplomatic Services operational officer.

## 2020-10-10 ENCOUNTER — Inpatient Hospital Stay
Admission: EM | Admit: 2020-10-10 | Discharge: 2020-10-17 | DRG: 291 | Disposition: A | Discharge status: Hospice | Payer: Medicare HMO | Attending: Internal Medicine | Admitting: Internal Medicine

## 2020-10-10 ENCOUNTER — Other Ambulatory Visit: Payer: Self-pay

## 2020-10-10 ENCOUNTER — Emergency Department: Payer: Medicare HMO

## 2020-10-10 ENCOUNTER — Encounter: Payer: Self-pay | Admitting: Emergency Medicine

## 2020-10-10 DIAGNOSIS — Z9119 Patient's noncompliance with other medical treatment and regimen: Secondary | ICD-10-CM

## 2020-10-10 DIAGNOSIS — E873 Alkalosis: Secondary | ICD-10-CM | POA: Diagnosis present

## 2020-10-10 DIAGNOSIS — J441 Chronic obstructive pulmonary disease with (acute) exacerbation: Secondary | ICD-10-CM | POA: Diagnosis present

## 2020-10-10 DIAGNOSIS — I5043 Acute on chronic combined systolic (congestive) and diastolic (congestive) heart failure: Secondary | ICD-10-CM | POA: Diagnosis present

## 2020-10-10 DIAGNOSIS — I10 Essential (primary) hypertension: Secondary | ICD-10-CM | POA: Diagnosis not present

## 2020-10-10 DIAGNOSIS — I493 Ventricular premature depolarization: Secondary | ICD-10-CM | POA: Diagnosis present

## 2020-10-10 DIAGNOSIS — T502X5A Adverse effect of carbonic-anhydrase inhibitors, benzothiadiazides and other diuretics, initial encounter: Secondary | ICD-10-CM | POA: Diagnosis present

## 2020-10-10 DIAGNOSIS — J9601 Acute respiratory failure with hypoxia: Secondary | ICD-10-CM | POA: Diagnosis present

## 2020-10-10 DIAGNOSIS — I471 Supraventricular tachycardia: Secondary | ICD-10-CM | POA: Diagnosis present

## 2020-10-10 DIAGNOSIS — R739 Hyperglycemia, unspecified: Secondary | ICD-10-CM | POA: Diagnosis present

## 2020-10-10 DIAGNOSIS — J9621 Acute and chronic respiratory failure with hypoxia: Secondary | ICD-10-CM | POA: Diagnosis not present

## 2020-10-10 DIAGNOSIS — A4150 Gram-negative sepsis, unspecified: Secondary | ICD-10-CM | POA: Insufficient documentation

## 2020-10-10 DIAGNOSIS — Z515 Encounter for palliative care: Secondary | ICD-10-CM | POA: Diagnosis not present

## 2020-10-10 DIAGNOSIS — F17210 Nicotine dependence, cigarettes, uncomplicated: Secondary | ICD-10-CM | POA: Diagnosis present

## 2020-10-10 DIAGNOSIS — I5033 Acute on chronic diastolic (congestive) heart failure: Secondary | ICD-10-CM | POA: Diagnosis present

## 2020-10-10 DIAGNOSIS — G894 Chronic pain syndrome: Secondary | ICD-10-CM | POA: Diagnosis present

## 2020-10-10 DIAGNOSIS — I5023 Acute on chronic systolic (congestive) heart failure: Secondary | ICD-10-CM | POA: Diagnosis not present

## 2020-10-10 DIAGNOSIS — E875 Hyperkalemia: Secondary | ICD-10-CM | POA: Diagnosis present

## 2020-10-10 DIAGNOSIS — Z96653 Presence of artificial knee joint, bilateral: Secondary | ICD-10-CM | POA: Diagnosis present

## 2020-10-10 DIAGNOSIS — I110 Hypertensive heart disease with heart failure: Principal | ICD-10-CM | POA: Diagnosis present

## 2020-10-10 DIAGNOSIS — Z7951 Long term (current) use of inhaled steroids: Secondary | ICD-10-CM

## 2020-10-10 DIAGNOSIS — R0602 Shortness of breath: Secondary | ICD-10-CM | POA: Diagnosis not present

## 2020-10-10 DIAGNOSIS — Z66 Do not resuscitate: Secondary | ICD-10-CM | POA: Diagnosis present

## 2020-10-10 DIAGNOSIS — Z720 Tobacco use: Secondary | ICD-10-CM | POA: Diagnosis not present

## 2020-10-10 DIAGNOSIS — I2510 Atherosclerotic heart disease of native coronary artery without angina pectoris: Secondary | ICD-10-CM | POA: Diagnosis present

## 2020-10-10 DIAGNOSIS — F1010 Alcohol abuse, uncomplicated: Secondary | ICD-10-CM | POA: Diagnosis present

## 2020-10-10 DIAGNOSIS — J9622 Acute and chronic respiratory failure with hypercapnia: Secondary | ICD-10-CM | POA: Diagnosis not present

## 2020-10-10 DIAGNOSIS — E785 Hyperlipidemia, unspecified: Secondary | ICD-10-CM | POA: Diagnosis present

## 2020-10-10 DIAGNOSIS — Z20822 Contact with and (suspected) exposure to covid-19: Secondary | ICD-10-CM | POA: Diagnosis present

## 2020-10-10 DIAGNOSIS — I428 Other cardiomyopathies: Secondary | ICD-10-CM | POA: Diagnosis present

## 2020-10-10 DIAGNOSIS — Z79899 Other long term (current) drug therapy: Secondary | ICD-10-CM

## 2020-10-10 DIAGNOSIS — R000 Tachycardia, unspecified: Secondary | ICD-10-CM | POA: Diagnosis not present

## 2020-10-10 DIAGNOSIS — Z8249 Family history of ischemic heart disease and other diseases of the circulatory system: Secondary | ICD-10-CM

## 2020-10-10 DIAGNOSIS — Z7289 Other problems related to lifestyle: Secondary | ICD-10-CM

## 2020-10-10 DIAGNOSIS — R1310 Dysphagia, unspecified: Secondary | ICD-10-CM | POA: Diagnosis present

## 2020-10-10 DIAGNOSIS — Y929 Unspecified place or not applicable: Secondary | ICD-10-CM

## 2020-10-10 DIAGNOSIS — N390 Urinary tract infection, site not specified: Secondary | ICD-10-CM | POA: Insufficient documentation

## 2020-10-10 DIAGNOSIS — Z825 Family history of asthma and other chronic lower respiratory diseases: Secondary | ICD-10-CM

## 2020-10-10 DIAGNOSIS — I248 Other forms of acute ischemic heart disease: Secondary | ICD-10-CM | POA: Diagnosis present

## 2020-10-10 DIAGNOSIS — Z7189 Other specified counseling: Secondary | ICD-10-CM | POA: Diagnosis not present

## 2020-10-10 LAB — CBC WITH DIFFERENTIAL/PLATELET
Abs Immature Granulocytes: 0.04 10*3/uL (ref 0.00–0.07)
Basophils Absolute: 0 10*3/uL (ref 0.0–0.1)
Basophils Relative: 0 %
Eosinophils Absolute: 0.2 10*3/uL (ref 0.0–0.5)
Eosinophils Relative: 2 %
HCT: 48.9 % (ref 39.0–52.0)
Hemoglobin: 16.4 g/dL (ref 13.0–17.0)
Immature Granulocytes: 0 %
Lymphocytes Relative: 18 %
Lymphs Abs: 1.8 10*3/uL (ref 0.7–4.0)
MCH: 34.4 pg — ABNORMAL HIGH (ref 26.0–34.0)
MCHC: 33.5 g/dL (ref 30.0–36.0)
MCV: 102.5 fL — ABNORMAL HIGH (ref 80.0–100.0)
Monocytes Absolute: 0.9 10*3/uL (ref 0.1–1.0)
Monocytes Relative: 9 %
Neutro Abs: 7.2 10*3/uL (ref 1.7–7.7)
Neutrophils Relative %: 71 %
Platelets: 253 10*3/uL (ref 150–400)
RBC: 4.77 MIL/uL (ref 4.22–5.81)
RDW: 13 % (ref 11.5–15.5)
WBC: 10.1 10*3/uL (ref 4.0–10.5)
nRBC: 0 % (ref 0.0–0.2)

## 2020-10-10 LAB — BLOOD GAS, VENOUS
Acid-Base Excess: 9.4 mmol/L — ABNORMAL HIGH (ref 0.0–2.0)
Bicarbonate: 38.2 mmol/L — ABNORMAL HIGH (ref 20.0–28.0)
O2 Saturation: 22.6 %
Patient temperature: 37
pCO2, Ven: 66 mmHg — ABNORMAL HIGH (ref 44.0–60.0)
pH, Ven: 7.37 (ref 7.250–7.430)
pO2, Ven: <31 mmHg — CL (ref 32.0–45.0)

## 2020-10-10 LAB — COMPREHENSIVE METABOLIC PANEL
ALT: 68 U/L — ABNORMAL HIGH (ref 0–44)
AST: 34 U/L (ref 15–41)
Albumin: 4.1 g/dL (ref 3.5–5.0)
Alkaline Phosphatase: 71 U/L (ref 38–126)
Anion gap: 9 (ref 5–15)
BUN: 18 mg/dL (ref 8–23)
CO2: 33 mmol/L — ABNORMAL HIGH (ref 22–32)
Calcium: 9.4 mg/dL (ref 8.9–10.3)
Chloride: 93 mmol/L — ABNORMAL LOW (ref 98–111)
Creatinine, Ser: 0.65 mg/dL (ref 0.61–1.24)
GFR, Estimated: >60 mL/min (ref >60)
Glucose, Bld: 159 mg/dL — ABNORMAL HIGH (ref 70–99)
Potassium: 3.7 mmol/L (ref 3.5–5.1)
Sodium: 135 mmol/L (ref 135–145)
Total Bilirubin: 1.4 mg/dL — ABNORMAL HIGH (ref 0.3–1.2)
Total Protein: 7.1 g/dL (ref 6.5–8.1)

## 2020-10-10 LAB — TROPONIN I (HIGH SENSITIVITY)
Troponin I (High Sensitivity): 41 ng/L — ABNORMAL HIGH (ref <18)
Troponin I (High Sensitivity): 41 ng/L — ABNORMAL HIGH (ref <18)

## 2020-10-10 LAB — MAGNESIUM: Magnesium: 2.1 mg/dL (ref 1.7–2.4)

## 2020-10-10 LAB — BRAIN NATRIURETIC PEPTIDE: B Natriuretic Peptide: 973.5 pg/mL — ABNORMAL HIGH (ref 0.0–100.0)

## 2020-10-10 MED ORDER — ACETAMINOPHEN 325 MG PO TABS
650.0000 mg | ORAL_TABLET | Freq: Four times a day (QID) | ORAL | Status: DC | PRN
  Administered 2020-10-11 – 2020-10-15 (×2): 650 mg via ORAL
  Filled 2020-10-10: qty 2

## 2020-10-10 MED ORDER — LOSARTAN POTASSIUM 25 MG PO TABS
25.0000 mg | ORAL_TABLET | Freq: Every day | ORAL | Status: DC
  Filled 2020-10-10: qty 1

## 2020-10-10 MED ORDER — FUROSEMIDE 10 MG/ML IJ SOLN
40.0000 mg | Freq: Two times a day (BID) | INTRAMUSCULAR | Status: DC
  Administered 2020-10-11 – 2020-10-13 (×5): 40 mg via INTRAVENOUS
  Filled 2020-10-10 (×5): qty 4

## 2020-10-10 MED ORDER — NICOTINE 21 MG/24HR TD PT24
21.0000 mg | MEDICATED_PATCH | Freq: Every day | TRANSDERMAL | Status: DC
  Administered 2020-10-10 – 2020-10-17 (×9): 21 mg via TRANSDERMAL
  Filled 2020-10-10 (×8): qty 1

## 2020-10-10 MED ORDER — ONDANSETRON HCL 4 MG/2ML IJ SOLN
4.0000 mg | Freq: Four times a day (QID) | INTRAMUSCULAR | Status: DC | PRN
  Administered 2020-10-11: 4 mg via INTRAVENOUS
  Filled 2020-10-10: qty 2

## 2020-10-10 MED ORDER — OXYCODONE HCL 5 MG PO TABS
10.0000 mg | ORAL_TABLET | Freq: Once | ORAL | Status: AC
  Administered 2020-10-10: 10 mg via ORAL
  Filled 2020-10-10: qty 2

## 2020-10-10 MED ORDER — ALBUTEROL SULFATE HFA 108 (90 BASE) MCG/ACT IN AERS
2.0000 | INHALATION_SPRAY | RESPIRATORY_TRACT | Status: DC | PRN
  Filled 2020-10-10: qty 6.7

## 2020-10-10 MED ORDER — OXYCODONE HCL 5 MG PO TABS
10.0000 mg | ORAL_TABLET | Freq: Four times a day (QID) | ORAL | Status: DC | PRN
  Administered 2020-10-10 – 2020-10-17 (×26): 10 mg via ORAL
  Filled 2020-10-10 (×26): qty 2

## 2020-10-10 MED ORDER — FUROSEMIDE 10 MG/ML IJ SOLN
40.0000 mg | Freq: Once | INTRAMUSCULAR | Status: AC
  Administered 2020-10-10: 40 mg via INTRAVENOUS
  Filled 2020-10-10: qty 4

## 2020-10-10 MED ORDER — TRAZODONE HCL 50 MG PO TABS
50.0000 mg | ORAL_TABLET | Freq: Every evening | ORAL | Status: DC | PRN
  Administered 2020-10-14: 50 mg via ORAL
  Filled 2020-10-10 (×2): qty 1

## 2020-10-10 MED ORDER — ACETAMINOPHEN 650 MG RE SUPP: 650.0000 mg | Freq: Four times a day (QID) | RECTAL | Status: DC | PRN

## 2020-10-10 MED ORDER — HEPARIN SODIUM (PORCINE) 5000 UNIT/ML IJ SOLN
5000.0000 [IU] | Freq: Three times a day (TID) | INTRAMUSCULAR | Status: DC
  Administered 2020-10-11 – 2020-10-12 (×5): 5000 [IU] via SUBCUTANEOUS
  Filled 2020-10-10 (×5): qty 1

## 2020-10-10 MED ORDER — METHYLPREDNISOLONE SODIUM SUCC 125 MG IJ SOLR
125.0000 mg | Freq: Once | INTRAMUSCULAR | Status: AC
  Administered 2020-10-10: 125 mg via INTRAVENOUS
  Filled 2020-10-10: qty 2

## 2020-10-10 MED ORDER — IPRATROPIUM-ALBUTEROL 20-100 MCG/ACT IN AERS
1.0000 | INHALATION_SPRAY | Freq: Four times a day (QID) | RESPIRATORY_TRACT | Status: DC
  Administered 2020-10-11 – 2020-10-17 (×18): 1 via RESPIRATORY_TRACT
  Filled 2020-10-10 (×2): qty 4

## 2020-10-10 MED ORDER — GABAPENTIN 300 MG PO CAPS
300.0000 mg | ORAL_CAPSULE | Freq: Two times a day (BID) | ORAL | Status: DC
  Administered 2020-10-10 – 2020-10-17 (×13): 300 mg via ORAL
  Filled 2020-10-10 (×14): qty 1

## 2020-10-10 MED ORDER — POTASSIUM CHLORIDE CRYS ER 20 MEQ PO TBCR
20.0000 meq | EXTENDED_RELEASE_TABLET | Freq: Every day | ORAL | Status: DC
  Administered 2020-10-10: 20 meq via ORAL
  Filled 2020-10-10 (×2): qty 1

## 2020-10-10 MED ORDER — LACTATED RINGERS IV SOLN: INTRAVENOUS | Status: DC

## 2020-10-10 MED ORDER — ENOXAPARIN SODIUM 40 MG/0.4ML ~~LOC~~ SOLN: 40.0000 mg | SUBCUTANEOUS | Status: DC

## 2020-10-10 MED ORDER — ACETAMINOPHEN 325 MG PO TABS
650.0000 mg | ORAL_TABLET | Freq: Four times a day (QID) | ORAL | Status: DC | PRN
  Filled 2020-10-10: qty 2

## 2020-10-10 MED ORDER — METHYLPREDNISOLONE SODIUM SUCC 40 MG IJ SOLR
40.0000 mg | Freq: Four times a day (QID) | INTRAMUSCULAR | Status: DC
  Administered 2020-10-10 – 2020-10-11 (×2): 40 mg via INTRAVENOUS
  Filled 2020-10-10 (×2): qty 1

## 2020-10-10 MED ORDER — IPRATROPIUM-ALBUTEROL 0.5-2.5 (3) MG/3ML IN SOLN
9.0000 mL | Freq: Once | RESPIRATORY_TRACT | Status: AC
  Administered 2020-10-10: 9 mL via RESPIRATORY_TRACT
  Filled 2020-10-10: qty 3

## 2020-10-10 MED ORDER — ONDANSETRON HCL 4 MG PO TABS: 4.0000 mg | ORAL_TABLET | Freq: Four times a day (QID) | ORAL | Status: DC | PRN

## 2020-10-10 MED ORDER — SODIUM CHLORIDE 0.9 % IV SOLN
500.0000 mg | INTRAVENOUS | Status: DC
  Administered 2020-10-10: 500 mg via INTRAVENOUS
  Filled 2020-10-10 (×2): qty 500

## 2020-10-10 MED ORDER — SODIUM CHLORIDE 0.9 % IV SOLN
1.0000 g | INTRAVENOUS | Status: DC
  Administered 2020-10-10: 1 g via INTRAVENOUS
  Filled 2020-10-10 (×2): qty 10

## 2020-10-10 MED ORDER — CARVEDILOL 6.25 MG PO TABS
3.1250 mg | ORAL_TABLET | Freq: Two times a day (BID) | ORAL | Status: DC
  Administered 2020-10-11 – 2020-10-16 (×10): 3.125 mg via ORAL
  Filled 2020-10-10: qty 1
  Filled 2020-10-10: qty 0.5
  Filled 2020-10-10 (×12): qty 1

## 2020-10-10 NOTE — H&P (Signed)
History and Physical    PLEASE NOTE THAT DRAGON DICTATION SOFTWARE WAS USED IN THE CONSTRUCTION OF THIS NOTE.   Kurt Blankenship CBJ:[id number] DOB: [birth year] DOA: 10/10/2020  PCP: System, Provider Not In Patient coming from: home   I have personally briefly reviewed patient's old medical records in [facility]  Chief Complaint: Shortness of breath  HPI: Kurt Blankenship is a 64 y.o. male with medical history significant for chronic systolic heart failure with most recent echocardiogram in October 2021 showing LVEF 20 to 25%, hypertension, COPD, chronic tobacco abuse, chronic pain syndrome, who is admitted to [facility] on 10/10/2020 with acute hypoxic respiratory failure in the setting of acute on chronic systolic heart failure as well as acute COPD exacerbation after presenting from home to [facility] Emergency Department complaining of shortness of breath.   The patient reports that he has been experiencing 8-9 days of progressive shortness of breath associated with orthopnea, PND, and peripheral edema.  He has noted some associated expiratory wheezing over that time as well as a mild nonproductive cough.  Denies any associated subjective fever, chills, rigors, or generalized myalgias.  Denies any associated recent headache, neck stiffness, rhinitis, rhinorrhea, sore throat, nausea, vomiting, abdominal pain, diarrhea, rash.  No recent traveling or known COVID-19 exposures.  Denies any associated chest pain, diaphoresis, or palpitations.  In the setting of the symptoms, the patient was admitted to [facility] on 10/05/2020 for further evaluation management of acute on chronic systolic heart failure as well as acute COPD exacerbation.  During this previous hospitalization he was treated with IV diuretics as well as systemic corticosteroids.  With these measures, the patient acknowledges that his symptoms began to improve, although he ultimately  left the hospital AGAINST MEDICAL ADVICE on 10/08/2020.  In the interval since leaving AMA, the patient reports worsening of his shortness of breath prompting him to return to [facility] emergency department today for further evaluation and management of such.  In the setting of chronic systolic as well as COPD, the patient reports he has been prescribed supplemental oxygen at home, but, according to the patient, only on a as needed basis for pulse oximetry readings 92%.  He reports that he has not been using any supplemental oxygen at home recently.     ED Course today:  Vital signs in the ED were notable for the following: Temperature max 97.6; heart rate 92-98; blood pressure 111/60 6-1 19/92; respiratory rate 21-23; initial oxygen saturation noted to be 83% on room air, which improved to 98% on 4 L nasal cannula.  Labs were notable for the following: CMP notable for the following: Sodium 135, bicarbonate 33, creatinine 0.65 relative to most recent prior creatinine data point 0.7 on 10/08/2020.  CBC notable for white blood cell count of 10,100, hemoglobin 16.4.  High-sensitivity troponin I x1 found to be 41, representing a decline from most recent prior value of 49 on 10/05/2020.  Nasopharyngeal COVID-19 PCR as well as influenza PCR were checked in the emergency department today, with results of this study currently pending.  Today's chest x-ray showed cardiomegaly with vascular congestion and probable right pleural effusion in the absence of any evidence of pneumothorax.  Today's EKG shows sinus rhythm with since PVC, ventricular rate 98, and no evidence of T wave or ST changes relative to EKG from 06/08/2020.  While in the ED, the following were administered: Lasix 40 mg IV x1, duo nebulizer treatment, Solu-Medrol 125  mg IV x1.  Subsequently, the patient was admitted to the med telemetry floor for further evaluation and management of acute hypoxic respiratory failure due to  acute on chronic systolic heart failure as well as suspected acute COPD exacerbation.     Review of Systems: As per HPI otherwise 10 point review of systems negative.   Past Medical History:  Diagnosis Date  . Alcohol use   . CHF (congestive heart failure) (HCC)   . COPD (chronic obstructive pulmonary disease) (HCC)   . Degenerative joint disease    a. S/p bilateral knee replacements.  . Hypertension   . Low back pain   . Tobacco abuse     Past Surgical History:  Procedure Laterality Date  . Bilateral knee replacements    . RIGHT/LEFT HEART CATH AND CORONARY ANGIOGRAPHY N/A 06/10/2020   Procedure: RIGHT/LEFT HEART CATH AND CORONARY ANGIOGRAPHY;  Surgeon: Yvonne Kendall, MD;  Location: [facility] INVASIVE CV LAB;  Service: Cardiovascular;  Laterality: N/A;    Social History:  reports that he has been smoking cigarettes. He has been smoking about 1.50 packs per day. He has never used smokeless tobacco. He reports current alcohol use of about 1.0 - 2.0 standard drink of alcohol per week. He reports current drug use. Drug: Cocaine.   No Known Allergies  Family History  Problem Relation Age of Onset  . Heart failure Mother        died w/ "enlarged heart" in late 77's  . Cancer Father   . COPD Sister   . Heart attack Brother        died in his late 37's  . COPD Sister   . COPD Sister      Prior to Admission medications   Medication Sig Start Date End Date Taking? Authorizing Provider  albuterol (PROVENTIL HFA;VENTOLIN HFA) 108 (90 Base) MCG/ACT inhaler Inhale 2 puffs into the lungs every 4 (four) hours as needed for wheezing or shortness of breath.    [provider]  budesonide-formoterol (SYMBICORT) 160-4.5 MCG/ACT inhaler Inhale 2 puffs into the lungs 2 (two) times daily.    [provider]  carvedilol (COREG) 3.125 MG tablet Take 1 tablet (3.125 mg total) by mouth 2 (two) times daily with a meal. 06/10/20 10/05/20  Arrien, York Ram, MD  Colchicine  (MITIGARE) 0.6 MG CAPS Take 1 capsule by mouth daily as needed. 02/08/20   [provider]  furosemide (LASIX) 20 MG tablet Take 1 tablet (20 mg total) by mouth daily. Start on 06/11/20 06/11/20 10/05/20  Arrien, York Ram, MD  gabapentin (NEURONTIN) 300 MG capsule Take 1 capsule by mouth in the morning and at bedtime. 03/24/20 03/24/21  [provider]  losartan (COZAAR) 25 MG tablet Take 1 tablet (25 mg total) by mouth daily. 06/11/20 10/05/20  Arrien, York Ram, MD  meclizine (ANTIVERT) 12.5 MG tablet Take 1 tablet by mouth 3 (three) times daily as needed for dizziness. 11/17/19 11/16/20  [provider]  Oxycodone HCl 10 MG TABS Take 10 mg by mouth every 6 (six) hours.    [provider]  potassium chloride SA (KLOR-CON) 20 MEQ tablet Take 1 tablet (20 mEq total) by mouth daily. 06/11/20 10/05/20  Arrien, York Ram, MD  SUMAtriptan (IMITREX) 50 MG tablet Take 1 tablet by mouth every 2 (two) hours as needed. 09/16/20   [provider]  traZODone (DESYREL) 50 MG tablet Take 1 tablet by mouth at bedtime. 04/17/19   [provider]     Objective  Physical Exam: Vitals:   10/10/20 1528 10/10/20 1600 10/10/20 1615  BP: (!) 119/92 111/66   Pulse: 92 93 93  Resp: 20 18 20   Temp: 97.6 F (36.4 C)    TempSrc: Oral    SpO2: (!) 83% 100% 100%  Weight: 84.8 kg    Height: 5\' 9"  (1.753 m)      General: appears to be stated age; alert, oriented; increased work of breathing noted Skin: warm, dry, no rash Head:  AT/[place] Eyes:  PEARL b/l, EOMI Mouth:  Oral mucosa membranes appear moist, normal dentition Neck: supple; trachea midline Heart:  RRR; did not appreciate any M/R/G Lungs: CTAB, did not appreciate any wheezes, rales, or rhonchi Abdomen: + BS; soft, ND, NT Vascular: 2+ pedal pulses b/l; 2+ radial pulses b/l Extremities: 2+ pitting edema in bilateral lower extremities; no muscle wasting Neuro: strength and sensation intact in  upper and lower extremities b/l   Labs on Admission: I have personally reviewed following labs and imaging studies  CBC: Recent Labs  Lab 10/05/20 1217 10/06/20 0409 10/08/20 0411 10/10/20 1547  WBC 9.2 7.4 6.3 10.1  NEUTROABS  --   --   --  7.2  HGB 15.4 14.5 14.2 16.4  HCT 45.2 42.5 40.7 48.9  MCV 101.6* 103.2* 100.7* 102.5*  PLT 219 188 180 253   Basic Metabolic Panel: Recent Labs  Lab 10/05/20 1217 10/06/20 0409 10/07/20 0509 10/08/20 0411 10/10/20 1547  NA 136 132* 135 132* 135  K 3.7 4.3 4.3 4.2 3.7  CL 99 95* 95* 94* 93*  CO2 27 29 32 29 33*  GLUCOSE 100* 175* 80 194* 159*  BUN 15 18 26* 27* 18  CREATININE 0.75 0.85 1.05 0.71 0.65  CALCIUM 9.1 9.0 9.4 9.0 9.4  MG  --  2.1  --   --   --    GFR: Estimated Creatinine Clearance: 94.5 mL/min (by C-G formula based on SCr of 0.65 mg/dL). Liver Function Tests: Recent Labs  Lab 10/10/20 1547  AST 34  ALT 68*  ALKPHOS 71  BILITOT 1.4*  PROT 7.1  ALBUMIN 4.1   No results for input(s): LIPASE, AMYLASE in the last 168 hours. No results for input(s): AMMONIA in the last 168 hours. Coagulation Profile: Recent Labs  Lab 10/05/20 1857  INR 1.1   Cardiac Enzymes: No results for input(s): CKTOTAL, CKMB, CKMBINDEX, TROPONINI in the last 168 hours. BNP (last 3 results) No results for input(s): PROBNP in the last 8760 hours. HbA1C: No results for input(s): HGBA1C in the last 72 hours. CBG: Recent Labs  Lab 10/07/20 1115 10/07/20 1626  GLUCAP 84 181*   Lipid Profile: No results for input(s): CHOL, HDL, LDLCALC, TRIG, CHOLHDL, LDLDIRECT in the last 72 hours. Thyroid Function Tests: No results for input(s): TSH, T4TOTAL, FREET4, T3FREE, THYROIDAB in the last 72 hours. Anemia Panel: No results for input(s): VITAMINB12, FOLATE, FERRITIN, TIBC, IRON, RETICCTPCT in the last 72 hours. Urine analysis:    Component Value Date/Time   COLORURINE AMBER (A) 02/25/2019 1203   APPEARANCEUR CLEAR (A) 02/25/2019 1203     APPEARANCEUR Clear 11/09/2012 1801   LABSPEC 1.029 02/25/2019 1203   LABSPEC 1.005 11/09/2012 1801   PHURINE 6.0 02/25/2019 1203   GLUCOSEU NEGATIVE 02/25/2019 1203   GLUCOSEU Negative 11/09/2012 1801   HGBUR NEGATIVE 02/25/2019 1203   BILIRUBINUR NEGATIVE 02/25/2019 1203   BILIRUBINUR Negative 11/09/2012 1801   KETONESUR 20 (A) 02/25/2019 1203   PROTEINUR 100 (A) 02/25/2019 1203   NITRITE NEGATIVE  02/25/2019 1203   LEUKOCYTESUR NEGATIVE 02/25/2019 1203   LEUKOCYTESUR Negative 11/09/2012 1801    Radiological Exams on Admission: DG Chest Portable 1 View  Result Date: 10/10/2020 CLINICAL DATA:  Shortness of breath EXAM: PORTABLE CHEST 1 VIEW COMPARISON:  10/05/2020, CT 12/09/2013 FINDINGS: Incomplete inclusion of the lung bases. Enlarged cardiomediastinal silhouette with vascular calcification. Increased airspace disease at both bases with probable right pleural effusion. Aortic atherosclerosis. No pneumothorax. Loose body or calcified node at the left axilla. IMPRESSION: 1. Cardiomegaly with vascular congestion. 2. Increased airspace disease at both bases with probable right pleural effusion. Electronically Signed   By: Jasmine Pang M.D.   On: 10/10/2020 16:08     EKG: Independently reviewed, with result as described above.    Assessment/Plan   Kurt Blankenship is a 64 y.o. male with medical history significant for chronic systolic heart failure with most recent echocardiogram in October 2021 showing LVEF 20 to 25%, hypertension, COPD, chronic tobacco abuse, chronic pain syndrome, who is admitted to [facility] on 10/10/2020 with acute hypoxic respiratory failure in the setting of acute on chronic systolic heart failure as well as acute COPD exacerbation after presenting from home to [facility] Emergency Department complaining of shortness of breath.    Principal Problem:   Acute on chronic systolic CHF (congestive heart failure) (HCC) Active Problems:    Hypertension   COPD exacerbation (HCC)   HLD (hyperlipidemia)   Tobacco abuse   SOB (shortness of breath)   Acute respiratory failure with hypoxia (HCC)    #) Acute on chronic systolic heart failure: In the context of most recent echocardiogram on 10/06/2020 showing moderately dilated left ventricle with LVEF 20 to 25%, diagnosis acute exacerbation of chronic systolic heart failure is made on the basis of 1 week of progressive shortness of breath associated with orthopnea, PND, and worsening of peripheral edema, with chest x-ray showing evidence of cardiomegaly with increased pulmonary vascular congestion, along with finding of acute hypoxic respiratory failure, as further detailed below.  Suspect contribution from suboptimal compliance with outpatient cardiac and diuretic regimen, as acknowledged by the patient.  ACS felt to be less likely in the setting of EKG showing no evidence of acute ischemic changes.  Mildly elevated troponin, appears to be trending down from most recent prior value, and appears most closely associated with supply demand mismatch in the setting of acute hypoxic respiratory failure as well as acute on chronic heart failure as opposed to representing a type I process due to plaque rupture.  The patient denies any recent chest discomfort.  Of note, his current outpatient diuretic regimen consists of Lasix 40 mg p.o. daily.  He is also prescribed Coreg as well as losartan.  Lasix 40 mg IV x1 administered in the ED today.  In terms of subsequent IV Lasix dosing, will order Lasix 40 mg IV twice daily for now, which will effectively quadruple the equivalent outpatient oral dose.   Plan: Lasix 40 mg IV twice daily.  Monitor strict I's and O's and daily weights.  As the patient is already on a beta-blocker at home, will resume home Coreg.  Resume home losartan, with close attention to exam blood pressure is to ensure adequate afterload reduction to promote improvement in cardiac output.   Add on serum magnesium level.  Potassium chloride 20 mEq p.o. daily, first dose now.  Repeat BMP in the morning.  Add on BNP.  Monitor on telemetry.  Monitor continuous pulse oximetry.      #)  Acute COPD exacerbation: In the setting of a documented history of COPD, it appears that the patient is experiencing an acute COPD exacerbation, potentially as a consequence of respiratory distress induced by presenting acute on chronic systolic heart failure, as further described above, with further contributions from suboptimal compliance with his outpatient respiratory regimen of scheduled Symbicort as well as as needed albuterol.  Additional contributing factors include his history of chronic tobacco abuse, for which he confirms that he continues to smoke approximately 1.5 pack/day over the last 20 years.  Presentation associated with acute hypoxic respiratory failure, as above.  Plan: Sign Medrol 40 mg IV every 6 hours.  Scheduled Combivent inhaler every 6 hours.  As needed albuterol inhaler.  Also ordered azithromycin for the benefit of reduction in hospitalization duration that is associated with initiation of antibiotics in the setting of acute COPD exacerbation.  Monitor continuous pulse oximetry.  Monitor telemetry.  Check ABG.  Counseled the patient on the importance of complete smoking discontinuation.  We will attempt additional chart review to evaluate for any prior pulmonary function testing results.     #) Acute hypoxic respiratory failure: In the setting of no known baseline supplemental oxygen requirements, the patient's initial oxygen saturation was noted to be 83% upon arrival today, which has subsequently improved into the mid to high 90s on 4 L nasal cannula for over 4 hours, thereby meeting criteria for acute hypoxic respiratory failure.  This appears to be multifactorial in origin, with contributions from acute on chronic systolic heart failure as well as acute COPD exacerbation, as above.   ACS is felt to be less likely, as above.  COVID-19 PCR as well as influenza PCR were checked in the ED today, with results currently pending.  Clinically, acute pulmonary embolism appears to be less likely.  Plan: Work-up and management acute on chronic systolic heart failure including IV diuretics, as above, as well as with work-up and management of acute COPD exacerbation, including Solu-Medrol as well as scheduled and as needed nebulizer treatments, as further elaborated upon above.  We will follow for results of nasopharyngeal COVID-19 PCR as well as influenza PCR.  Check ABG.  Monitor on telemetry monitor on continuous pulse oximetry.  Repeat BMP tomorrow.  Check serum phosphorus level and serum magnesium levels.     #) Chronic tobacco abuse: The patient confirms that he is a current smoker, having smoked 1.5 pack/day for at least the last 20 years.  Counseled patient on the importance of complete smoking discontinuation of particularly in the setting of documented history of underlying COPD.  I have ordered a nicotine patch restart this hospitalization.     #) Chronic pain syndrome in the setting of chronic low back pain: The patient reports that he is on scheduled oxycodone 10 mg p.o. every 6 hours as an outpatient as well as gabapentin nightly.  I have discussed with the patient that I will be ordering his oxycodone on a as needed basis as opposed to scheduling this medication, and he verbalizes his amenability to this approach.  Plan: Resume home oxycodone, but on a as needed basis, as above.  Resume gabapentin.  Close monitoring for evidence of ensuing diminished respiratory drive, including monitoring on continuous pulse oximetry.     DVT prophylaxis: Lovenox 40 mg subcu daily Code Status: Full code Family Communication: none Disposition Plan: Per Rounding Team Consults called: none  Admission status: Inpatient; med telemetry.    PLEASE NOTE THAT DRAGON DICTATION SOFTWARE WAS  USED  IN THE CONSTRUCTION OF THIS NOTE.   Angie Fava DO [facility] Pager [phone] From 12PM- 12AM  Otherwise, please contact night-coverage  [email or web address] Password [facility]  10/10/2020, 5:19 PM

## 2020-10-10 NOTE — ED Triage Notes (Signed)
Pt presents to ED via POV with c/o increasing SOB since being D/C from hospital on 10/23, pt with swelling noted to R eyelid at this time, pt states has been using rescue inhaler without relief, pt also with BLE and abdominal distention on arrival to ED.

## 2020-10-10 NOTE — ED Provider Notes (Signed)
[facility] Emergency Department Provider Note   ____________________________________________   First MD Initiated Contact with Patient 10/10/20 1528     (approximate)  I have reviewed the triage vital signs and the nursing notes.   HISTORY  Chief Complaint Shortness of Breath    HPI Kurt Blankenship is a 64 y.o. male with past medical history of hypertension, hyperlipidemia, CHF, COPD on 2 L, alcohol abuse, and gout who presents to the ED complaining of shortness of breath.  Patient was recently admitted to the hospital for CHF and COPD exacerbation, ended up signing out AMA 2 days ago.  He states that ever since then his breathing has been getting worse and he has noticed increasing swelling in his legs.  He has had a nonproductive cough and complains of some pressure in his chest, but denies any fevers.  He has been using his rescue inhaler without significant relief, feels like he is holding onto extra fluid despite taking p.o. Lasix.        Past Medical History:  Diagnosis Date  . Alcohol use   . CHF (congestive heart failure) (HCC)   . COPD (chronic obstructive pulmonary disease) (HCC)   . Degenerative joint disease    a. S/p bilateral knee replacements.  . Hypertension   . Low back pain   . Tobacco abuse     Patient Active Problem List   Diagnosis Date Noted  . COPD exacerbation (HCC) 10/05/2020  . Acute on chronic systolic CHF (congestive heart failure) (HCC) 10/05/2020  . Elevated troponin 10/05/2020  . HTN (hypertension) 10/05/2020  . HLD (hyperlipidemia) 10/05/2020  . Alcohol use   . Tobacco abuse   . Cardiomyopathy (HCC)   . Syncope 06/08/2020  . Syncope and collapse 06/08/2020  . Hypertension   . Hypokalemia   . COPD (chronic obstructive pulmonary disease) (HCC) 05/15/2016    Past Surgical History:  Procedure Laterality Date  . Bilateral knee replacements    . RIGHT/LEFT HEART CATH AND CORONARY ANGIOGRAPHY N/A 06/10/2020    Procedure: RIGHT/LEFT HEART CATH AND CORONARY ANGIOGRAPHY;  Surgeon: Yvonne Kendall, MD;  Location: [facility] INVASIVE CV LAB;  Service: Cardiovascular;  Laterality: N/A;    Prior to Admission medications   Medication Sig Start Date End Date Taking? Authorizing Provider  albuterol (PROVENTIL HFA;VENTOLIN HFA) 108 (90 Base) MCG/ACT inhaler Inhale 2 puffs into the lungs every 4 (four) hours as needed for wheezing or shortness of breath.    [provider]  budesonide-formoterol (SYMBICORT) 160-4.5 MCG/ACT inhaler Inhale 2 puffs into the lungs 2 (two) times daily.    [provider]  carvedilol (COREG) 3.125 MG tablet Take 1 tablet (3.125 mg total) by mouth 2 (two) times daily with a meal. 06/10/20 10/05/20  Arrien, York Ram, MD  Colchicine (MITIGARE) 0.6 MG CAPS Take 1 capsule by mouth daily as needed. 02/08/20   [provider]  furosemide (LASIX) 20 MG tablet Take 1 tablet (20 mg total) by mouth daily. Start on 06/11/20 06/11/20 10/05/20  Arrien, York Ram, MD  gabapentin (NEURONTIN) 300 MG capsule Take 1 capsule by mouth in the morning and at bedtime. 03/24/20 03/24/21  [provider]  losartan (COZAAR) 25 MG tablet Take 1 tablet (25 mg total) by mouth daily. 06/11/20 10/05/20  Arrien, York Ram, MD  meclizine (ANTIVERT) 12.5 MG tablet Take 1 tablet by mouth 3 (three) times daily as needed for dizziness. 11/17/19 11/16/20  [provider]  Oxycodone HCl 10 MG TABS Take 10  mg by mouth every 6 (six) hours.    [provider]  potassium chloride SA (KLOR-CON) 20 MEQ tablet Take 1 tablet (20 mEq total) by mouth daily. 06/11/20 10/05/20  Arrien, York Ram, MD  SUMAtriptan (IMITREX) 50 MG tablet Take 1 tablet by mouth every 2 (two) hours as needed. 09/16/20   [provider]  traZODone (DESYREL) 50 MG tablet Take 1 tablet by mouth at bedtime. 04/17/19   [provider]    Allergies Patient has no known  allergies.  Family History  Problem Relation Age of Onset  . Heart failure Mother        died w/ "enlarged heart" in late 1's  . Cancer Father   . COPD Sister   . Heart attack Brother        died in his late 71's  . COPD Sister   . COPD Sister     Social History Social History   Tobacco Use  . Smoking status: Current Every Day Smoker    Packs/day: 1.50    Types: Cigarettes  . Smokeless tobacco: Never Used  Vaping Use  . Vaping Use: Never used  Substance Use Topics  . Alcohol use: Yes    Alcohol/week: 1.0 - 2.0 standard drink    Types: 1 - 2 Cans of beer per week    Comment: 1-2 beer daily  . Drug use: Yes    Types: Cocaine    Comment: pt denies    Review of Systems  Constitutional: No fever/chills Eyes: No visual changes. ENT: No sore throat. Cardiovascular: Positive for chest pain. Respiratory: Positive for cough and shortness of breath. Gastrointestinal: No abdominal pain.  No nausea, no vomiting.  No diarrhea.  No constipation. Genitourinary: Negative for dysuria. Musculoskeletal: Negative for back pain.  Positive for leg swelling. Skin: Negative for rash. Neurological: Negative for headaches, focal weakness or numbness.  ____________________________________________   PHYSICAL EXAM:  VITAL SIGNS: ED Triage Vitals  Enc Vitals Group     BP 10/10/20 1528 (!) 119/92     Pulse Rate 10/10/20 1528 92     Resp 10/10/20 1528 20     Temp 10/10/20 1528 97.6 F (36.4 C)     Temp Source 10/10/20 1528 Oral     SpO2 10/10/20 1528 (!) 83 %     Weight 10/10/20 1528 187 lb (84.8 kg)     Height 10/10/20 1528 5\' 9"  (1.753 m)     Head Circumference --      Peak Flow --      Pain Score 10/10/20 1527 9     Pain Loc --      Pain Edu? --      Excl. in GC? --     Constitutional: Alert and oriented. Eyes: Conjunctivae are normal. Head: Atraumatic. Nose: No congestion/rhinnorhea. Mouth/Throat: Mucous membranes are moist. Neck: Normal ROM Cardiovascular: Normal  rate, regular rhythm. Grossly normal heart sounds. Respiratory: Moderate respiratory distress with accessory muscle use. Lungs with inspiratory and expiratory wheezing throughout. Gastrointestinal: Soft and nontender. No distention. Genitourinary: deferred Musculoskeletal: No lower extremity tenderness, 2+ pitting edema to knees bilaterally. Neurologic:  Normal speech and language. No gross focal neurologic deficits are appreciated. Skin:  Skin is warm, dry and intact. No rash noted. Psychiatric: Mood and affect are normal. Speech and behavior are normal.  ____________________________________________   LABS (all labs ordered are listed, but only abnormal results are displayed)  Labs Reviewed  CBC WITH DIFFERENTIAL/PLATELET - Abnormal; Notable for the following components:  Result Value   MCV 102.5 (*)    MCH 34.4 (*)    All other components within normal limits  COMPREHENSIVE METABOLIC PANEL - Abnormal; Notable for the following components:   Chloride 93 (*)    CO2 33 (*)    Glucose, Bld 159 (*)    ALT 68 (*)    Total Bilirubin 1.4 (*)    All other components within normal limits  BRAIN NATRIURETIC PEPTIDE - Abnormal; Notable for the following components:   B Natriuretic Peptide 973.5 (*)    All other components within normal limits  BLOOD GAS, VENOUS - Abnormal; Notable for the following components:   pCO2, Ven 66 (*)    pO2, Ven <31.0 (*)    Bicarbonate 38.2 (*)    Acid-Base Excess 9.4 (*)    All other components within normal limits  TROPONIN I (HIGH SENSITIVITY) - Abnormal; Notable for the following components:   Troponin I (High Sensitivity) 41 (*)    All other components within normal limits  RESPIRATORY PANEL BY RT PCR (FLU A&B, COVID)  TROPONIN I (HIGH SENSITIVITY)   ____________________________________________  EKG  ED ECG REPORT I, Chesley Noon, the attending physician, personally viewed and interpreted this ECG.   Date: 10/10/2020  EKG Time: 15:34   Rate: 98  Rhythm: normal sinus rhythm, PVC  Axis: Normal  Intervals:none  ST&T Change: None   PROCEDURES  Procedure(s) performed (including Critical Care):  .Critical Care Performed by: Chesley Noon, MD Authorized by: Chesley Noon, MD   Critical care provider statement:    Critical care time (minutes):  45   Critical care time was exclusive of:  Separately billable procedures and treating other patients and teaching time   Critical care was necessary to treat or prevent imminent or life-threatening deterioration of the following conditions:  Respiratory failure   Critical care was time spent personally by me on the following activities:  Discussions with consultants, evaluation of patient's response to treatment, examination of patient, ordering and performing treatments and interventions, ordering and review of laboratory studies, ordering and review of radiographic studies, pulse oximetry, re-evaluation of patient's condition, obtaining history from patient or surrogate and review of old charts   I assumed direction of critical care for this patient from another provider in my specialty: no       ____________________________________________   INITIAL IMPRESSION / ASSESSMENT AND PLAN / ED COURSE       64 year old male with past medical history of hypertension, hyperlipidemia, CHF, COPD on 2 L, alcohol abuse, and gout who presents to the ED with increasing difficulty breathing and leg swelling after signing out AMA from the hospital 2 days ago.  He is in moderate respiratory distress and initial O2 sats noted to be in the 80s on room air.  He was placed on 2 L nasal cannula with improvement, given his significant wheezing we will treat with duo nebs and steroids.  I suspect he also has an element of CHF exacerbation given he clinically appears fluid overloaded.  EKG shows no evidence of arrhythmia or ischemia, labs and chest x-ray are pending at this time.  Patient reports  feeling much better following DuoNeb x3 and IV Solu-Medrol, wheezing is much improved on reevaluation.  His chest x-ray also shows pulmonary edema by my read and we will treat with IV Lasix.  VBG shows mild hypercapnia but overall is reassuring with no acidosis.  Case discussed with hospitalist for admission.      ____________________________________________  FINAL CLINICAL IMPRESSION(S) / ED DIAGNOSES  Final diagnoses:  Acute on chronic respiratory failure with hypoxia and hypercapnia (HCC)  COPD exacerbation (HCC)  Acute on chronic systolic congestive heart failure [facility])     ED Discharge Orders    None       Note:  This document was prepared using Dragon voice recognition software and may include unintentional dictation errors.   Chesley Noon, MD 10/10/20 1730

## 2020-10-10 NOTE — ED Notes (Signed)
Pt placed on 4L  ?

## 2020-10-11 DIAGNOSIS — I5023 Acute on chronic systolic (congestive) heart failure: Secondary | ICD-10-CM

## 2020-10-11 DIAGNOSIS — J9601 Acute respiratory failure with hypoxia: Secondary | ICD-10-CM | POA: Diagnosis present

## 2020-10-11 DIAGNOSIS — R000 Tachycardia, unspecified: Secondary | ICD-10-CM | POA: Diagnosis not present

## 2020-10-11 DIAGNOSIS — Z515 Encounter for palliative care: Secondary | ICD-10-CM

## 2020-10-11 DIAGNOSIS — Z7189 Other specified counseling: Secondary | ICD-10-CM

## 2020-10-11 DIAGNOSIS — R0602 Shortness of breath: Secondary | ICD-10-CM | POA: Diagnosis present

## 2020-10-11 DIAGNOSIS — E875 Hyperkalemia: Secondary | ICD-10-CM | POA: Diagnosis not present

## 2020-10-11 DIAGNOSIS — J9621 Acute and chronic respiratory failure with hypoxia: Secondary | ICD-10-CM

## 2020-10-11 DIAGNOSIS — E785 Hyperlipidemia, unspecified: Secondary | ICD-10-CM | POA: Diagnosis not present

## 2020-10-11 LAB — COMPREHENSIVE METABOLIC PANEL
ALT: 62 U/L — ABNORMAL HIGH (ref 0–44)
AST: 36 U/L (ref 15–41)
Albumin: 3.5 g/dL (ref 3.5–5.0)
Alkaline Phosphatase: 64 U/L (ref 38–126)
Anion gap: 8 (ref 5–15)
BUN: 18 mg/dL (ref 8–23)
CO2: 31 mmol/L (ref 22–32)
Calcium: 9.1 mg/dL (ref 8.9–10.3)
Chloride: 92 mmol/L — ABNORMAL LOW (ref 98–111)
Creatinine, Ser: 0.67 mg/dL (ref 0.61–1.24)
GFR, Estimated: >60 mL/min (ref >60)
Glucose, Bld: 217 mg/dL — ABNORMAL HIGH (ref 70–99)
Potassium: 5.3 mmol/L — ABNORMAL HIGH (ref 3.5–5.1)
Sodium: 131 mmol/L — ABNORMAL LOW (ref 135–145)
Total Bilirubin: 0.9 mg/dL (ref 0.3–1.2)
Total Protein: 5.9 g/dL — ABNORMAL LOW (ref 6.5–8.1)

## 2020-10-11 LAB — CBC
HCT: 45.2 % (ref 39.0–52.0)
Hemoglobin: 15.1 g/dL (ref 13.0–17.0)
MCH: 34.5 pg — ABNORMAL HIGH (ref 26.0–34.0)
MCHC: 33.4 g/dL (ref 30.0–36.0)
MCV: 103.2 fL — ABNORMAL HIGH (ref 80.0–100.0)
Platelets: 210 10*3/uL (ref 150–400)
RBC: 4.38 MIL/uL (ref 4.22–5.81)
RDW: 12.8 % (ref 11.5–15.5)
WBC: 8.3 10*3/uL (ref 4.0–10.5)
nRBC: 0 % (ref 0.0–0.2)

## 2020-10-11 LAB — RESPIRATORY PANEL BY RT PCR (FLU A&B, COVID)
Influenza A by PCR: NEGATIVE
Influenza B by PCR: NEGATIVE
SARS Coronavirus 2 by RT PCR: NEGATIVE

## 2020-10-11 LAB — CORTISOL-AM, BLOOD: Cortisol - AM: 5.6 ug/dL — ABNORMAL LOW (ref 6.7–22.6)

## 2020-10-11 LAB — PHOSPHORUS: Phosphorus: 4 mg/dL (ref 2.5–4.6)

## 2020-10-11 LAB — PROTIME-INR
INR: 1.1 (ref 0.8–1.2)
Prothrombin Time: 13.4 seconds (ref 11.4–15.2)

## 2020-10-11 LAB — MAGNESIUM: Magnesium: 2 mg/dL (ref 1.7–2.4)

## 2020-10-11 LAB — PROCALCITONIN: Procalcitonin: <0.1 ng/mL

## 2020-10-11 LAB — TSH: TSH: 0.484 u[IU]/mL (ref 0.350–4.500)

## 2020-10-11 MED ORDER — METHYLPREDNISOLONE SODIUM SUCC 40 MG IJ SOLR
40.0000 mg | Freq: Two times a day (BID) | INTRAMUSCULAR | Status: DC
  Administered 2020-10-11 – 2020-10-12 (×2): 40 mg via INTRAVENOUS
  Filled 2020-10-11 (×2): qty 1

## 2020-10-11 MED ORDER — ATORVASTATIN CALCIUM 20 MG PO TABS
20.0000 mg | ORAL_TABLET | Freq: Every day | ORAL | Status: DC
  Administered 2020-10-11 – 2020-10-16 (×5): 20 mg via ORAL
  Filled 2020-10-11 (×6): qty 1

## 2020-10-11 MED ORDER — ASPIRIN EC 81 MG PO TBEC
81.0000 mg | DELAYED_RELEASE_TABLET | Freq: Every day | ORAL | Status: DC
  Administered 2020-10-11 – 2020-10-17 (×7): 81 mg via ORAL
  Filled 2020-10-11 (×7): qty 1

## 2020-10-11 NOTE — Progress Notes (Addendum)
Pt arrived to the floor via stretcher accompanied by transporter on 4L [place]. Pt transferred from ED to room 220 (2C). Orientation to room, unit, staff and equipment performed. Admission history completed. COVID test negative.

## 2020-10-11 NOTE — Consult Note (Signed)
Cardiology Consultation:   Patient ID: Kurt Blankenship; [id number]; 1956/05/23   Admit date: 10/10/2020 Date of Consult: 10/11/2020  Primary Care Provider: System, Provider Not In Primary Cardiologist: End Primary Electrophysiologist:  None   Patient Profile:   Kurt Blankenship is a 64 y.o. 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 Allena Katz.  History of Present Illness:   Kurt Blankenship was admitted to the hospital in 05/2020 with LOC felt to be possibly related to intoxication with underlying electrolyte abnormalities versus fall, not felt to be cardiac in etiology. EKG showed sinus rhythm with nonspecific ST-T changes and a prolonged QT at 449 ms in the setting of mild hypokalemia of 3.5, hypomagnesemia 1.7, and hypocalcemia of 8.7. Echo showed an EF of 20 to 25%, global hypokinesis, normal RV systolic function with mildly enlarged RV cavity size, and a right atrial pressure of 8 mmHg. He underwent diagnostic R/LHC on 06/10/2020 which showed mild to moderate nonobstructive CAD with 50% stenosis involving a small D1 branch as well as mid RCA. His systolic dysfunction was out of proportion to underlying CAD consistent with nonischemic, potential alcoholic cardiomyopathy. He had mildly elevated left and right heart filling pressures with a low normal to mildly reduced cardiac output. He was medically managed with carvedilol, losartan, and furosemide. Following discharge he was lost to follow-up.   He was recently admitted to the hospital on 10/20 with increased dyspnea with noted orthopnea and bilateral lower extremity swelling  felt to be related to CHF and exacerbations. He reported a 20 pound weight gain. Labs were notable for a BNP of 1305 and an initial and peaked troponin of 61. Urine drug screen was negative for cocaine. Potassium, magnesium, and calcium were at goal. No EKG in epic available for review. Echo demonstrated an EF of 20 to 25%, moderately dilated LV cavity, normal LV diastolic function, moderately reduced RV systolic function with normal RV cavity size, mild mitral regurgitation, dilated IVC, and right atrial pressure of 8 mmHg. Chest x-ray showed diffuse interstitial opacities along with possible small bilateral pleural effusions and borderline cardiomegaly. He was treated with IV Lasix, steroids, bronchodilators, incentive spirometry, and supplemental oxygen. On 10/23 he left AMA and prior to his supplemental oxygen being set up for home use.   He returned to [facility] on 10/25 with progressive shortness of breath, lower extremity swelling, and abdominal distention since leaving the hospital AMA 2 days prior. He has been using his rescue inhaler and taking extra Lasix without symptom improvement. He was not using supplemental oxygen at home. Labs showed a mildly improved BNP of 973, high-sensitivity troponin of 41 with a delta troponin of 41, magnesium 2.0, potassium 3.7 trending to 5.3, BUN 18/0.65, ALT 68, PCT less than 0.1, PCO2 on VBG elevated at 66. Chest x-ray showed cardiomegaly with vascular congestion with increased airspace disease at both bases with probable right-sided pleural effusion. EKG showed NSR, 98 bpm, rare PVC, underlying artifact and baseline wandering, nonspecific ST-T changes with a QT of 342 and QTC of 437. He has received IV azithromycin and ceftriaxone along with IV Lasix 40 mg  x 2 since ED arrival, duo nebs, Solu-Medrol, KCl, and lactated Ringer's at 125 mL/h.  He reports a couple week history of increased shortness of breath, lower extremity swelling, abdominal distention,  two-pillow orthopnea (baseline 1 pillow), and cough.  More recently, he noted weight gain trending from 160 pounds to 187 pounds over a 4-day time span prompting him to come to the ED on 10/20 as outlined above.  Since his current admission, he notes improvement in his dyspnea with IV diuresis documented at approximately 1 L for the past 24 hours.  He continues to note some lower extremity swelling, abdominal distention, and orthopnea.  He has had intermittent chest discomfort that is improving.  He also notes intermittent palpitations, last occurring in the ED.  He is not aware of any recent lifestyle changes that could have exacerbated his volume overload though has been eating cold cut sandwiches predominantly for lunch.  He does report adherence to his diuretic regimen.  He does continue to smoke and drink two, 12 ounce beers nightly.  He denies any recent cocaine use.  Current weight 194 pounds with a discharge weight in 05/2020 of 170 pounds and a patient reported prior weight leading up to his admission around 160 pounds.   Past Medical History:  Diagnosis Date  . Alcohol use   . CHF (congestive heart failure) (HCC)   . COPD (chronic obstructive pulmonary disease) (HCC)   . Degenerative joint disease    a. S/p bilateral knee replacements.  . Hypertension   . Low back pain   . Tobacco abuse     Past Surgical History:  Procedure Laterality Date  . Bilateral knee replacements    . RIGHT/LEFT HEART CATH AND CORONARY ANGIOGRAPHY N/A 06/10/2020   Procedure: RIGHT/LEFT HEART CATH AND CORONARY ANGIOGRAPHY;  Surgeon: Yvonne Kendall, MD;  Location: [facility] INVASIVE CV LAB;  Service: Cardiovascular;  Laterality: N/A;     Home Meds: Prior to Admission medications   Medication Sig Start Date End Date Taking? Authorizing Provider  budesonide-formoterol (SYMBICORT) 160-4.5 MCG/ACT inhaler Inhale 2 puffs into the lungs 2 (two) times daily.   Yes [provider]  carvedilol (COREG) 3.125 MG  tablet Take 1 tablet (3.125 mg total) by mouth 2 (two) times daily with a meal. 06/10/20 10/10/20 Yes Arrien, York Ram, MD  furosemide (LASIX) 40 MG tablet Take 40 mg by mouth daily. 10/03/20  Yes [provider]  gabapentin (NEURONTIN) 300 MG capsule Take 300 mg by mouth in the morning and at bedtime.  03/24/20 03/24/21 Yes [provider]  losartan (COZAAR) 25 MG tablet Take 1 tablet (25 mg total) by mouth daily. 06/11/20 10/10/20 Yes Arrien, York Ram, MD  Oxycodone HCl 10 MG TABS Take 10 mg by mouth every 6 (six) hours. Up to 5 times per day prn for pain   Yes [provider]  potassium chloride SA (KLOR-CON) 20 MEQ tablet Take 1 tablet (20 mEq total) by mouth daily. 06/11/20 10/10/20 Yes Arrien, York Ram, MD  traZODone (DESYREL) 50 MG tablet Take 50 mg by mouth at bedtime.  04/17/19  Yes [provider]  albuterol (PROVENTIL HFA;VENTOLIN HFA) 108 (90 Base) MCG/ACT inhaler Inhale 2 puffs into the lungs every 4 (four) hours as needed for wheezing or shortness of breath.    [provider]  Colchicine (MITIGARE) 0.6 MG CAPS Take 0.6 mg by mouth daily as needed.  02/08/20   [provider]  meclizine (ANTIVERT) 12.5 MG tablet Take 12.5 mg by mouth  3 (three) times daily as needed for dizziness.  11/17/19 11/16/20  [provider]  SUMAtriptan (IMITREX) 50 MG tablet Take 50 mg by mouth every 2 (two) hours as needed.  09/16/20   [provider]    Inpatient Medications: Scheduled Meds: . carvedilol  3.125 mg Oral BID WC  . furosemide  40 mg Intravenous BID  . gabapentin  300 mg Oral BID  . heparin  5,000 Units Subcutaneous Q8H  . Ipratropium-Albuterol  1 puff Inhalation Q6H WA  . losartan  25 mg Oral Daily  . methylPREDNISolone (SOLU-MEDROL) injection  40 mg Intravenous Q12H  . nicotine  21 mg Transdermal Daily   Continuous Infusions:  PRN Meds: acetaminophen **OR** acetaminophen, acetaminophen **OR**  acetaminophen, albuterol, ondansetron **OR** ondansetron (ZOFRAN) IV, oxyCODONE, traZODone  Allergies:  No Known Allergies  Social History:   Social History   Socioeconomic History  . Marital status: Single    Spouse name: Not on file  . Number of children: Not on file  . Years of education: Not on file  . Highest education level: Not on file  Occupational History  . Not on file  Tobacco Use  . Smoking status: Current Every Day Smoker    Packs/day: 1.50    Types: Cigarettes  . Smokeless tobacco: Never Used  Vaping Use  . Vaping Use: Never used  Substance and Sexual Activity  . Alcohol use: Yes    Alcohol/week: 1.0 - 2.0 standard drink    Types: 1 - 2 Cans of beer per week    Comment: 1-2 beer daily  . Drug use: Yes    Types: Cocaine    Comment: pt denies  . Sexual activity: Not on file  Other Topics Concern  . Not on file  Social History Narrative   Lives locally w/ a friend.  Does not routinely exercise.   Social Determinants of Health   Financial Resource Strain:   . Difficulty of Paying Living Expenses: Not on file  Food Insecurity:   . Worried About Programme researcher, broadcasting/film/video in the Last Year: Not on file  . Ran Out of Food in the Last Year: Not on file  Transportation Needs:   . Lack of Transportation (Medical): Not on file  . Lack of Transportation (Non-Medical): Not on file  Physical Activity:   . Days of Exercise per Week: Not on file  . Minutes of Exercise per Session: Not on file  Stress:   . Feeling of Stress : Not on file  Social Connections:   . Frequency of Communication with Friends and Family: Not on file  . Frequency of Social Gatherings with Friends and Family: Not on file  . Attends Religious Services: Not on file  . Active Member of Clubs or Organizations: Not on file  . Attends Banker Meetings: Not on file  . Marital Status: Not on file  Intimate Partner Violence:   . Fear of Current or Ex-Partner: Not on file  . Emotionally  Abused: Not on file  . Physically Abused: Not on file  . Sexually Abused: Not on file     Family History:   Family History  Problem Relation Age of Onset  . Heart failure Mother        died w/ "enlarged heart" in late 63's  . Cancer Father   . COPD Sister   . Heart attack Brother        died in his late 40's  . COPD Sister   .  COPD Sister     ROS:  Review of Systems  Constitutional: Positive for malaise/fatigue. Negative for chills, diaphoresis, fever and weight loss.  HENT: Negative for congestion.   Eyes: Negative for discharge and redness.  Respiratory: Positive for cough, shortness of breath and wheezing. Negative for sputum production.   Cardiovascular: Positive for chest pain, palpitations, orthopnea and leg swelling. Negative for claudication and PND.  Gastrointestinal: Negative for abdominal pain, heartburn, nausea and vomiting.  Musculoskeletal: Negative for falls and myalgias.  Skin: Negative for rash.  Neurological: Positive for weakness. Negative for dizziness, tingling, tremors, sensory change, speech change, focal weakness and loss of consciousness.  Endo/Heme/Allergies: Does not bruise/bleed easily.  Psychiatric/Behavioral: Negative for substance abuse. The patient is not nervous/anxious.   All other systems reviewed and are negative.     Physical Exam/Data:   Vitals:   10/11/20 0418 10/11/20 0434 10/11/20 0803 10/11/20 1130  BP: (!) 115/94  110/86 (!) 128/93  Pulse: 94  90 95  Resp: 18  18 16   Temp: 97.8 F (36.6 C)  97.6 F (36.4 C) 97.7 F (36.5 C)  TempSrc:   Oral   SpO2: 100%  92% 98%  Weight:  88.2 kg    Height:        Intake/Output Summary (Last 24 hours) at 10/11/2020 1436 Last data filed at 10/11/2020 1300 Gross per 24 hour  Intake 1040 ml  Output 1330 ml  Net -290 ml   Filed Weights   10/10/20 1528 10/11/20 0434  Weight: 84.8 kg 88.2 kg   Body mass index is 28.71 kg/m.   Physical Exam: General: Well developed, well nourished,  in no acute distress. Head: Normocephalic, atraumatic, sclera non-icteric, no xanthomas, nares without discharge.  Neck: Negative for carotid bruits. JVD elevated ~ 12 cm. Lungs: Diminished breath sounds bilaterally with expiratory wheezing noted and bilateral crackles along the bases. Breathing is unlabored. Heart: RRR with S1 S2. No murmurs, rubs, or gallops appreciated. Abdomen: Soft, mild diffuse tenderness, mildly distended with normoactive bowel sounds. No hepatomegaly. No rebound/guarding. No obvious abdominal masses. Msk:  Strength and tone appear normal for age. Extremities: No clubbing or cyanosis.  1+ lower extremity pitting edema bilaterally. Distal pedal pulses are 2+ and equal bilaterally. Neuro: Alert and oriented X 3. No facial asymmetry. No focal deficit. Moves all extremities spontaneously. Psych:  Responds to questions appropriately with a normal affect.   EKG:  The EKG was personally reviewed and demonstrates: NSR, 98 bpm, rare PVC, underlying artifact and baseline wandering, nonspecific ST-T changes with a QT of 342 and QTC of 437 Telemetry:  Telemetry was personally reviewed and demonstrates: SR with occasional isolated PVC, short run of atrial tach  Weights: Filed Weights   10/10/20 1528 10/11/20 0434  Weight: 84.8 kg 88.2 kg    Relevant CV Studies: 2D echo 06/08/2020: 1. Left ventricular ejection fraction, by estimation, is 20 to 25%. The  left ventricle has severely decreased function. The left ventricle  demonstrates global hypokinesis. Left ventricular diastolic function could  not be evaluated.  2. Right ventricular systolic function is normal. The right ventricular  size is mildly enlarged.  3. The mitral valve was not well visualized. No evidence of mitral valve  regurgitation.  4. The aortic valve was not well visualized. Aortic valve regurgitation  not well assessed.  5. The inferior vena cava is dilated in size with >50% respiratory  variability,  suggesting right atrial pressure of 8 mmHg.  __________  [facility] 06/10/2020: Conclusions: 1. Mild  to moderate, nonobstructive coronary artery disease with up to 50% stenoses involving small D1 branch as well as mid RCA.  Systolic dysfunction is out of proportion to CAD, consistent with nonischemic cardiomyopathy. 2. Mildly elevated left and right heart filling pressures. 3. Low normal to mildly reduced Fick cardiac output.  Recommendations: 1. Optimize goal-directed medical therapy for management of nonischemic cardiomyopathy.  Smoking, tobacco, and drug cessation encouraged, as cardiomyopathy certainly could be related to substance abuse. 2. Medical therapy and risk factor modification to prevent progression of coronary artery disease. __________  2D echo 10/06/2020: 1. Left ventricular ejection fraction, by estimation, is 20 to 25%. The  left ventricle has severely decreased function. The left ventricle has no  regional wall motion abnormalities. The left ventricular internal cavity  size was moderately dilated. Left  ventricular diastolic parameters were normal.  2. Right ventricular systolic function is moderately reduced. The right  ventricular size is normal. Tricuspid regurgitation signal is inadequate  for assessing PA pressure.  3. Mild mitral valve regurgitation.  4. The inferior vena cava is dilated in size with >50% respiratory  variability, suggesting right atrial pressure of 8 mmHg.   Laboratory Data:  Chemistry Recent Labs  Lab 10/08/20 0411 10/10/20 1547 10/11/20 0511  NA 132* 135 131*  K 4.2 3.7 5.3*  CL 94* 93* 92*  CO2 29 33* 31  GLUCOSE 194* 159* 217*  BUN 27* 18 18  CREATININE 0.71 0.65 0.67  CALCIUM 9.0 9.4 9.1  GFRNONAA >60 >60 >60  ANIONGAP Recent Labs  Lab 10/10/20 1547 10/11/20 0511  PROT 7.1 5.9*  ALBUMIN 4.1 3.5  AST 34 36  ALT 68* 62*  ALKPHOS 71 64  BILITOT 1.4* 0.9   Hematology Recent Labs  Lab 10/08/20 0411  10/10/20 1547 10/11/20 0511  WBC 6.3 10.1 8.3  RBC 4.04* 4.77 4.38  HGB 14.2 16.4 15.1  HCT 40.7 48.9 45.2  MCV 100.7* 102.5* 103.2*  MCH 35.1* 34.4* 34.5*  MCHC 34.9 33.5 33.4  RDW 12.8 13.0 12.8  PLT 180 253 210   Cardiac EnzymesNo results for input(s): TROPONINI in the last 168 hours. No results for input(s): TROPIPOC in the last 168 hours.  BNP Recent Labs  Lab 10/05/20 1221 10/10/20 1547  BNP 1,305.7* 973.5*    DDimer No results for input(s): DDIMER in the last 168 hours.  Radiology/Studies:  DG Chest Portable 1 View  Result Date: 10/10/2020 IMPRESSION: 1. Cardiomegaly with vascular congestion. 2. Increased airspace disease at both bases with probable right pleural effusion. Electronically Signed   By: Jasmine Pang M.D.   On: 10/10/2020 16:08    Assessment and Plan:   1. Acute on chronic hypoxic respiratory failure:  -Multifactorial including acute on chronic BiV failure and acute COPD exacerbation in the context of nonadherence with medical therapy  -Supplemental oxygen  2. Acute on chronic BiV failure secondary to NICM:  -He continues to appear volume overloaded -Continue IV Lasix 40 mg twice daily, likely for the next couple of days -Continue carvedilol -Losartan currently being held for hyperkalemia which was likely in the setting of the patient receiving lactated Ringer's and KCl -As potassium normalizes look to resume losartan -Escalate GDMT with resumption of ARB and potential addition of MRA prior to discharge or if needed in the outpatient setting -Could consider transition to [facility] in outpatient follow-up -Daily weights -Strict I's and O's -Consider Reds vest in a day or 2 following diuresis -If he demonstrates adherence  to medical regimen and outpatient follow-up and in this setting is medically optimized with GDMT, would recommend repeating limited echo in several months time to evaluate for improvement in LV systolic function.  If at that time,  despite medical compliance and maximally tolerated evidence-based medical therapy, his EF remains less than 35% would recommend referral to EP for consideration of ICD  3. Elevated troponin with nonobstructive CAD:  -Minimally elevated and nontrending not consistent with ACS  -EKG nonacute  -Recent diagnostic cath demonstrated nonobstructive disease in 05/2020 as outlined above  -Continue current medical management including aspirin, carvedilol -No indication for heparin drip or ischemic testing in the inpatient setting at this time  4. Polysubstance use:  -Repeat urine drug screen  -Complete cessation is recommended   5. COPD exacerbation:  -Management per internal medicine  -Recommend tapering steroid use when possible  6.  Hyperkalemia: -No longer receiving LR -Discontinue KCl -Hold losartan -Currently no indication for Kayexalate  7.  HLD: -LDL of 73 on 10/06/2020 with goal being less than 70 -Consider adding atorvastatin in the outpatient setting, this will be deferred at this time in an effort not to overwhelm the patient with medications  8.  Palpitations/atrial tachycardia: -He reports a history of palpitations though denies any earlier in the day today when there was documented atrial tach on telemetry -Continue carvedilol as above -Potassium 5.3 currently, magnesium 2.0 -Check UDS and TSH   For questions or updates, please contact [facility] Please consult [email or web address] for contact info under Cardiology/STEMI.   Signed, Eula Listen, PA-C [facility] Pager: [phone] 10/11/2020, 2:36 PM

## 2020-10-11 NOTE — Progress Notes (Addendum)
[facility]  - [place] at [facility]   PATIENT NAME: Kurt Blankenship    MR#:  [id number]  DATE OF BIRTH:  01/17/1956  SUBJECTIVE:   Patient came in last night with increasing shortness of breath. He left AMA three days ago. Seems to be doing better. Urine output good after IV Lasix. Patient appears to be noncompliant with oxygen at home. Continues to smoke. He was with a roommate. REVIEW OF SYSTEMS:   Review of Systems  Constitutional: Negative for chills, fever and weight loss.  HENT: Negative for ear discharge, ear pain and nosebleeds.   Eyes: Negative for blurred vision, pain and discharge.  Respiratory: Positive for shortness of breath and wheezing. Negative for sputum production and stridor.   Cardiovascular: Positive for orthopnea, leg swelling and PND. Negative for chest pain and palpitations.  Gastrointestinal: Negative for abdominal pain, diarrhea, nausea and vomiting.  Genitourinary: Negative for frequency and urgency.  Musculoskeletal: Negative for back pain and joint pain.  Neurological: Positive for weakness. Negative for sensory change, speech change and focal weakness.  Psychiatric/Behavioral: Negative for depression and hallucinations. The patient is not nervous/anxious.    Tolerating Diet:yesTolerating PT: pending  DRUG ALLERGIES:  No Known Allergies  VITALS:  Blood pressure (!) 128/93, pulse 95, temperature 97.7 F (36.5 C), resp. rate 16, height 5\' 9"  (1.753 m), weight 88.2 kg, SpO2 98 %.  PHYSICAL EXAMINATION:   Physical Exam  GENERAL:  64 y.o.-year-old patient lying in the bed with no acute distress.chronically ill   HEENT: Head atraumatic, normocephalic. Oropharynx and nasopharynx clear.  NECK:  Supple, +jugular venous distention. No thyroid enlargement, no tenderness.  LUNGS: Normal breath sounds bilaterally, no wheezing, ++rales,no rhonchi. No use of accessory muscles of respiration.  CARDIOVASCULAR: S1, S2 normal. No murmurs, rubs, or  gallops.  ABDOMEN: Soft, nontender, + distended. Bowel sounds present. No organomegaly or mass.  EXTREMITIES: ++ edema b/l.    NEUROLOGIC: Cranial nerves II through XII are intact. No focal Motor or sensory deficits b/l.  deconditioned PSYCHIATRIC:  patient is alert and oriented x 3.  SKIN: No obvious rash, lesion, or ulcer.   LABORATORY PANEL:  CBC Recent Labs  Lab 10/11/20 0511  WBC 8.3  HGB 15.1  HCT 45.2  PLT 210    Chemistries  Recent Labs  Lab 10/11/20 0511  NA 131*  K 5.3*  CL 92*  CO2 31  GLUCOSE 217*  BUN 18  CREATININE 0.67  CALCIUM 9.1  MG 2.0  AST 36  ALT 62*  ALKPHOS 64  BILITOT 0.9   Cardiac Enzymes No results for input(s): TROPONINI in the last 168 hours. RADIOLOGY:  DG Chest Portable 1 View  Result Date: 10/10/2020 CLINICAL DATA:  Shortness of breath EXAM: PORTABLE CHEST 1 VIEW COMPARISON:  10/05/2020, CT 12/09/2013 FINDINGS: Incomplete inclusion of the lung bases. Enlarged cardiomediastinal silhouette with vascular calcification. Increased airspace disease at both bases with probable right pleural effusion. Aortic atherosclerosis. No pneumothorax. Loose body or calcified node at the left axilla. IMPRESSION: 1. Cardiomegaly with vascular congestion. 2. Increased airspace disease at both bases with probable right pleural effusion. Electronically Signed   By: 12/11/2013 M.D.   On: 10/10/2020 16:08   ASSESSMENT AND PLAN:   Kurt Blankenship is a 64 y.o. male with medical history significant for chronic systolic heart failure with most recent echocardiogram in October 2021 showing LVEF 20 to 25%, hypertension, COPD, chronic tobacco abuse, chronic pain syndrome, who is admitted to [facility]  on 10/10/2020 with acute hypoxic respiratory failure in the setting of acute on chronic systolic heart failure as well as acute COPD exacerbation after presenting from home complaining of shortness of breath.  Acute on chronic systolic CHF  (congestive heart failure) (HCC): - 2D echo on 6/20//21 and 10/06/20 showed EF of 20-25%.  - Patient has elevated BNP 900, bilateral leg edema, positive JVD, lung crackles on auscultation, consistent with CHF exacerbation. -no clinical evidence of pneumonia. Discontinue IV antibiotic. -Lasix 40 mg bid by IV (patient received 60 mg of Lasix by IV in ED) -Daily weights -strict I/O's -Low salt diet -Fluid restriction -continue Coreg. Hold losartan with soft blood pressure and elevated K -cardiology consultation with [facility]-- informed -TOC for discharge planning-- patient will benefit from home health RN and PT. He'll also benefit from [facility] CHF clinic.  COPD exacerbation [facility]): Patient has some mild wheezing on auscultation, indicating COPD exacerbation. -Bronchodilators -As needed Mucinex -Patient received 125 mg of Solu-Medrol-- Solu-Medrol 40 BID-- change to taper at discharge -patient wears chronic oxygen at home however appears to be noncompliant  Elevated troponin: Troponin 41 --> 41. No chest pain, likely due to demand ischemia -Aspirin, Coreg -no chest pain.  HTN (hypertension) -IV hydralazine as needed -Continue Coreg -holding losartan  HLD (hyperlipidemia): Patient used to take Lipitor, currently not taking his medications -Follow-up FLP  H/o Tobacco abuse and Alcohol abuse: -Did counseling about importance of quitting smoking -Nicotine patch -watch for withdrawal symptoms from alcohol  Hyperglycemia suspect steroid induced -A1c 5.6  palliative care consultation. Patient has multiple comorbidities.  DVT ppx: SQ heparin Code Status: Full code Family Communication: not done, no family member is at bed side.   Disposition Plan:  Anticipate discharge back to previous environment Consults called:  none Admission status: inpatient   Status is: Inpatient  Remains inpatient appropriate because:Inpatient level of care appropriate due to severity of illness.  Patient has multiple comorbidities, now presents with combination of CHF and COPD exacerbation.  Patient also has elevated troponin.  BNP is up to 900. Patient is at high risk of deteriorating given his very low EF 20-25%.  Patient will need to be treated in hospital for at least 2 days.   Dispo: The patient is from: Home  Anticipated d/c is to: Home  Anticipated d/c date is: 2 days  Patient currently is not medically stable to discharge due to being treated for CHF and COPD exacerbation  TOTAL TIME TAKING CARE OF THIS PATIENT: *25* minutes.  >50% time spent on counselling and coordination of care  Note: This dictation was prepared with Dragon dictation along with smaller phrase technology. Any transcriptional errors that result from this process are unintentional.  Enedina Finner M.D    [facility]   CC: Primary care physician; System, Provider Not InPatient ID: Kurt Blankenship, male   DOB: [birth year], 64 y.o.   MRN: [id number]

## 2020-10-11 NOTE — Progress Notes (Signed)
Called to room by patient for IV "beeping"; addressed "beep"; noticed that O2 tubing was not on patient; he stated that, ".. I only use it when I need it...sometimes 3, sometimes 4... I don't use it all the time".  Refused O2 at this time, tubing placed within reach; O2 at 3L on standby.  Denies SOB. Reported to care nurse, Darolyn Rua. Windy Carina, RN; 12:38 AM ;10/11/2020

## 2020-10-11 NOTE — Progress Notes (Signed)
Sepsis screen performed and is negative. Will continue to monitor. 

## 2020-10-11 NOTE — Plan of Care (Addendum)
Pt Axox4. Calm and cooperative for the most part but forgetful at times. Pt refuses to wear his oxygen. Pt o2 sat 98-100% on RA but some shortness on exertion noted. Pt had an episode where he denied receiving his pain pills for chronic knee and/or back pain and Solumedrol. When pain pills due again, this Nurse brought in Charge Nurse to witness administration of pain pills.   Pt able to void using the urinal standing at the edge of the bed. Pt tolerates his snack/diet and ate all of his food without any complaints. Abdomen large and distended. On tele, NSR. Pt receiving LR at 137ml/hr and Ceftriaxone IV from a different line as they are not compatible. On Lasix, Solumedrol, and Heparin. Vitals stable. Safety measures in place. Will continue to monitor.  Problem: Education: Goal: Knowledge of General Education information will improve Description: Including pain rating scale, medication(s)/side effects and non-pharmacologic comfort measures 10/11/2020 0507 by Darolyn Rua, RN Outcome: Progressing 10/11/2020 0506 by Darolyn Rua, RN Outcome: Progressing   Problem: Health Behavior/Discharge Planning: Goal: Ability to manage health-related needs will improve 10/11/2020 0507 by Darolyn Rua, RN Outcome: Progressing 10/11/2020 0506 by Darolyn Rua, RN Outcome: Progressing   Problem: Clinical Measurements: Goal: Ability to maintain clinical measurements within normal limits will improve 10/11/2020 0507 by Darolyn Rua, RN Outcome: Progressing 10/11/2020 0506 by Darolyn Rua, RN Outcome: Progressing Goal: Will remain free from infection 10/11/2020 0507 by Darolyn Rua, RN Outcome: Progressing 10/11/2020 0506 by Darolyn Rua, RN Outcome: Progressing Goal: Diagnostic test results will improve 10/11/2020 0507 by Darolyn Rua, RN Outcome: Progressing 10/11/2020 0506 by Darolyn Rua, RN Outcome: Progressing Goal: Respiratory complications will improve 10/11/2020  0507 by Darolyn Rua, RN Outcome: Progressing 10/11/2020 0506 by Darolyn Rua, RN Outcome: Progressing Goal: Cardiovascular complication will be avoided 10/11/2020 0507 by Darolyn Rua, RN Outcome: Progressing 10/11/2020 0506 by Darolyn Rua, RN Outcome: Progressing   Problem: Activity: Goal: Risk for activity intolerance will decrease 10/11/2020 0507 by Darolyn Rua, RN Outcome: Progressing 10/11/2020 0506 by Darolyn Rua, RN Outcome: Progressing   Problem: Nutrition: Goal: Adequate nutrition will be maintained 10/11/2020 0507 by Darolyn Rua, RN Outcome: Progressing 10/11/2020 0506 by Darolyn Rua, RN Outcome: Progressing   Problem: Coping: Goal: Level of anxiety will decrease 10/11/2020 0507 by Darolyn Rua, RN Outcome: Progressing 10/11/2020 0506 by Darolyn Rua, RN Outcome: Progressing   Problem: Elimination: Goal: Will not experience complications related to bowel motility 10/11/2020 0507 by Darolyn Rua, RN Outcome: Progressing 10/11/2020 0506 by Darolyn Rua, RN Outcome: Progressing Goal: Will not experience complications related to urinary retention 10/11/2020 0507 by Darolyn Rua, RN Outcome: Progressing 10/11/2020 0506 by Darolyn Rua, RN Outcome: Progressing   Problem: Pain Managment: Goal: General experience of comfort will improve 10/11/2020 0507 by Darolyn Rua, RN Outcome: Progressing 10/11/2020 0506 by Darolyn Rua, RN Outcome: Progressing   Problem: Safety: Goal: Ability to remain free from injury will improve 10/11/2020 0507 by Darolyn Rua, RN Outcome: Progressing 10/11/2020 0506 by Darolyn Rua, RN Outcome: Progressing   Problem: Skin Integrity: Goal: Risk for impaired skin integrity will decrease 10/11/2020 0507 by Darolyn Rua, RN Outcome: Progressing 10/11/2020 0506 by Darolyn Rua, RN Outcome: Progressing   Problem: Education: Goal: Knowledge of disease or condition  will improve Outcome: Progressing Goal: Knowledge of the prescribed therapeutic regimen will improve Outcome: Progressing Goal: Individualized Educational Video(s) Outcome: Progressing   Problem: Activity: Goal: Ability to tolerate increased activity will improve Outcome: Progressing Goal: Will verbalize the importance of balancing activity with adequate rest periods  Outcome: Progressing   Problem: Respiratory: Goal: Ability to maintain a clear airway will improve Outcome: Progressing Goal: Levels of oxygenation will improve Outcome: Progressing Goal: Ability to maintain adequate ventilation will improve Outcome: Progressing

## 2020-10-11 NOTE — Consult Note (Addendum)
Consultation Note Date: 10/11/2020   Patient Name: Kurt Blankenship  DOB: [birth year]  MRN: [id number]  Age / Sex: 64 y.o., male  PCP: System, Provider Not In Referring Physician: Enedina Finner, MD  Reason for Consultation: Establishing goals of care  HPI/Patient Profile: Kurt Blankenship is a 64 y.o. male with medical history of chronic systolic heart failure with most recent echocardiogram in October 2021 showing LVEF 20 to 25%, hypertension, COPD, chronic tobacco abuse, chronic pain syndrome, who was admitted with acute hypoxic respiratory failure in the setting of acute on chronic systolic heart failure as well as acute COPD .  Clinical Assessment and Goals of Care: Patient is resting in bed. He has O2 in place. He states he lives with a friend of his. He has a brother who he states he would want as his surrogate decision maker (HPOA). He states he has 4 children.   Functionally, he states his QOL has diminished. He states he sleeps a lot of the time, and has to sleep on his right side so he can breathe. He states he cannot walk from the bedroom to the bathroom without SOB. He states he has SOB with eating and drinking. He is able to complete his ADL's with rest periods. He tells me he wears his O2 when he is moving about and when he feels like he needs it while at rest, but does not wear it all the time.   We discussed his diagnoses, prognosis, GOC, EOL wishes disposition and options.  A detailed discussion was had today regarding advanced directives.  Concepts specific to code status, artifical feeding and hydration, IV antibiotics and rehospitalization were discussed.  The difference between an aggressive medical intervention path and a comfort care path was discussed.  Values and goals of care important to patient and family were attempted to be elicited.  Discussed limitations of medical interventions to prolong  quality of life in some situations and discussed the concept of human mortality.  He revisits his poor QOL, and states he wants to complete this hospitalization with medical optimization, and then go home and not return to the hospital. He states his sister had hospice care, and he would like this for himself at home. He states he wants to focus on quality, comfort, and dignity, until he dies, and does not want to suffer. We discussed comfort focused care to start at D/C We discussed current care boundaries. He states he does not want chest compressions, shocks, or a breathing tube if his heart or lungs stop. He states he would not want a breathing tube or ventilator support for respiratory distress/failure.   I completed a MOST form today and the signed original was placed in the chart. A photocopy was placed in the chart to be scanned into EMR. The patient outlined their wishes for the following treatment decisions:  Cardiopulmonary Resuscitation: Do Not Attempt Resuscitation (DNR/No CPR)  Medical Interventions: Limited Additional Interventions: Use medical treatment, IV fluids and cardiac monitoring as indicated, DO NOT USE intubation  or mechanical ventilation. May consider use of less invasive airway support such as BiPAP or CPAP. Also provide comfort measures. Transfer to the hospital if indicated. Avoid intensive care.   Antibiotics: Antibiotics if indicated  IV Fluids: IV fluids for a defined trial period  Feeding Tube: No feeding tube        SUMMARY OF RECOMMENDATIONS   DNR/DNI.  Medical optimization this hospitalization and then D/C home with hospice.   Prognosis:   < 6 months COPD, CHF with EF of 20-25%.   Discharge Planning: Home with hospice.     Primary Diagnoses: Present on Admission: . Hypertension . HLD (hyperlipidemia) . Tobacco abuse . COPD exacerbation (HCC) . Acute on chronic systolic CHF (congestive heart failure) (HCC) . SOB (shortness of breath) . Acute  respiratory failure with hypoxia (HCC)   I have reviewed the medical record, interviewed the patient and family, and examined the patient. The following aspects are pertinent.  Past Medical History:  Diagnosis Date  . Alcohol use   . CHF (congestive heart failure) (HCC)   . COPD (chronic obstructive pulmonary disease) (HCC)   . Degenerative joint disease    a. S/p bilateral knee replacements.  . Hypertension   . Low back pain   . Tobacco abuse    Social History   Socioeconomic History  . Marital status: Single    Spouse name: Not on file  . Number of children: Not on file  . Years of education: Not on file  . Highest education level: Not on file  Occupational History  . Not on file  Tobacco Use  . Smoking status: Current Every Day Smoker    Packs/day: 1.50    Types: Cigarettes  . Smokeless tobacco: Never Used  Vaping Use  . Vaping Use: Never used  Substance and Sexual Activity  . Alcohol use: Yes    Alcohol/week: 1.0 - 2.0 standard drink    Types: 1 - 2 Cans of beer per week    Comment: 1-2 beer daily  . Drug use: Yes    Types: Cocaine    Comment: pt denies  . Sexual activity: Not on file  Other Topics Concern  . Not on file  Social History Narrative   Lives locally w/ a friend.  Does not routinely exercise.   Social Determinants of Health   Financial Resource Strain:   . Difficulty of Paying Living Expenses: Not on file  Food Insecurity:   . Worried About Programme researcher, broadcasting/film/video in the Last Year: Not on file  . Ran Out of Food in the Last Year: Not on file  Transportation Needs:   . Lack of Transportation (Medical): Not on file  . Lack of Transportation (Non-Medical): Not on file  Physical Activity:   . Days of Exercise per Week: Not on file  . Minutes of Exercise per Session: Not on file  Stress:   . Feeling of Stress : Not on file  Social Connections:   . Frequency of Communication with Friends and Family: Not on file  . Frequency of Social Gatherings  with Friends and Family: Not on file  . Attends Religious Services: Not on file  . Active Member of Clubs or Organizations: Not on file  . Attends Banker Meetings: Not on file  . Marital Status: Not on file   Family History  Problem Relation Age of Onset  . Heart failure Mother        died w/ "enlarged heart" in late  50's  . Cancer Father   . COPD Sister   . Heart attack Brother        died in his late 29's  . COPD Sister   . COPD Sister    Scheduled Meds: . aspirin EC  81 mg Oral Daily  . carvedilol  3.125 mg Oral BID WC  . furosemide  40 mg Intravenous BID  . gabapentin  300 mg Oral BID  . heparin  5,000 Units Subcutaneous Q8H  . Ipratropium-Albuterol  1 puff Inhalation Q6H WA  . methylPREDNISolone (SOLU-MEDROL) injection  40 mg Intravenous Q12H  . nicotine  21 mg Transdermal Daily   Continuous Infusions: PRN Meds:.acetaminophen **OR** acetaminophen, acetaminophen **OR** acetaminophen, albuterol, ondansetron **OR** ondansetron (ZOFRAN) IV, oxyCODONE, traZODone Medications Prior to Admission:  Prior to Admission medications   Medication Sig Start Date End Date Taking? Authorizing Provider  budesonide-formoterol (SYMBICORT) 160-4.5 MCG/ACT inhaler Inhale 2 puffs into the lungs 2 (two) times daily.   Yes [provider]  carvedilol (COREG) 3.125 MG tablet Take 1 tablet (3.125 mg total) by mouth 2 (two) times daily with a meal. 06/10/20 10/10/20 Yes Arrien, York Ram, MD  furosemide (LASIX) 40 MG tablet Take 40 mg by mouth daily. 10/03/20  Yes [provider]  gabapentin (NEURONTIN) 300 MG capsule Take 300 mg by mouth in the morning and at bedtime.  03/24/20 03/24/21 Yes [provider]  losartan (COZAAR) 25 MG tablet Take 1 tablet (25 mg total) by mouth daily. 06/11/20 10/10/20 Yes Arrien, York Ram, MD  Oxycodone HCl 10 MG TABS Take 10 mg by mouth every 6 (six) hours. Up to 5 times per day prn for pain   Yes [provider]    potassium chloride SA (KLOR-CON) 20 MEQ tablet Take 1 tablet (20 mEq total) by mouth daily. 06/11/20 10/10/20 Yes Arrien, York Ram, MD  traZODone (DESYREL) 50 MG tablet Take 50 mg by mouth at bedtime.  04/17/19  Yes [provider]  albuterol (PROVENTIL HFA;VENTOLIN HFA) 108 (90 Base) MCG/ACT inhaler Inhale 2 puffs into the lungs every 4 (four) hours as needed for wheezing or shortness of breath.    [provider]  Colchicine (MITIGARE) 0.6 MG CAPS Take 0.6 mg by mouth daily as needed.  02/08/20   [provider]  meclizine (ANTIVERT) 12.5 MG tablet Take 12.5 mg by mouth 3 (three) times daily as needed for dizziness.  11/17/19 11/16/20  [provider]  SUMAtriptan (IMITREX) 50 MG tablet Take 50 mg by mouth every 2 (two) hours as needed.  09/16/20   [provider]   No Known Allergies Review of Systems  Respiratory: Positive for shortness of breath.     Physical Exam Pulmonary:     Comments: O2 in place.  Neurological:     Mental Status: He is alert.     Vital Signs: BP (!) 128/93 (BP Location: Left Arm)   Pulse 95   Temp 97.7 F (36.5 C)   Resp 16   Ht 5\' 9"  (1.753 m)   Wt 88.2 kg   SpO2 98%   BMI 28.71 kg/m  Pain Scale: 0-10   Pain Score: 0-No pain   SpO2: SpO2: 98 % O2 Device:SpO2: 98 % O2 Flow Rate: .O2 Flow Rate (L/min): 3 L/min  IO: Intake/output summary:   Intake/Output Summary (Last 24 hours) at 10/11/2020 1522 Last data filed at 10/11/2020 1300 Gross per 24 hour  Intake 1040 ml  Output 1330 ml  Net -290 ml  LBM:   Baseline Weight: Weight: 84.8 kg Most recent weight: Weight: 88.2 kg     Palliative Assessment/Data:     Time In: 2:20 Time Out: 3:30 Time Total: 70 min Greater than 50%  of this time was spent counseling and coordinating care related to the above assessment and plan.  Signed by: Morton Stall, NP   Please contact Palliative Medicine Team phone at [phone] for questions and  concerns.  For individual provider: See Loretha Stapler

## 2020-10-12 DIAGNOSIS — Z720 Tobacco use: Secondary | ICD-10-CM | POA: Diagnosis not present

## 2020-10-12 DIAGNOSIS — I5023 Acute on chronic systolic (congestive) heart failure: Secondary | ICD-10-CM | POA: Diagnosis not present

## 2020-10-12 LAB — BASIC METABOLIC PANEL
Anion gap: 9 (ref 5–15)
BUN: 26 mg/dL — ABNORMAL HIGH (ref 8–23)
CO2: 33 mmol/L — ABNORMAL HIGH (ref 22–32)
Calcium: 9.8 mg/dL (ref 8.9–10.3)
Chloride: 90 mmol/L — ABNORMAL LOW (ref 98–111)
Creatinine, Ser: 0.94 mg/dL (ref 0.61–1.24)
GFR, Estimated: >60 mL/min (ref >60)
Glucose, Bld: 189 mg/dL — ABNORMAL HIGH (ref 70–99)
Potassium: 5.6 mmol/L — ABNORMAL HIGH (ref 3.5–5.1)
Sodium: 132 mmol/L — ABNORMAL LOW (ref 135–145)

## 2020-10-12 MED ORDER — ENOXAPARIN SODIUM 40 MG/0.4ML ~~LOC~~ SOLN
40.0000 mg | SUBCUTANEOUS | Status: DC
  Administered 2020-10-12 – 2020-10-16 (×5): 40 mg via SUBCUTANEOUS
  Filled 2020-10-12 (×5): qty 0.4

## 2020-10-12 MED ORDER — SODIUM POLYSTYRENE SULFONATE 15 GM/60ML PO SUSP
30.0000 g | Freq: Once | ORAL | Status: AC
  Administered 2020-10-12: 30 g via ORAL
  Filled 2020-10-12: qty 120

## 2020-10-12 NOTE — Plan of Care (Signed)
Patient resting in bed. He states he is happy to go home with hospice upon medical optimization, and will be staying with his cousin. No questions or concerns today.   No charge note.

## 2020-10-12 NOTE — Plan of Care (Signed)
Pt calm and cooperative and able to voice his needs. Pt put on 02 based on room temp or whether he feels like he can't get enough air. No more episodes of SVT. Temp WNL. Currently on 3L [place]. Prn pain med adm for chronic back, abdominal and knee pain. Effective. Safety measures in place. Will continue to monitor.  Problem: Education: Goal: Knowledge of General Education information will improve Description: Including pain rating scale, medication(s)/side effects and non-pharmacologic comfort measures 10/12/2020 0646 by Darolyn Rua, RN Outcome: Progressing 10/11/2020 1917 by Darolyn Rua, RN Outcome: Progressing   Problem: Health Behavior/Discharge Planning: Goal: Ability to manage health-related needs will improve 10/12/2020 0646 by Darolyn Rua, RN Outcome: Progressing 10/11/2020 1917 by Darolyn Rua, RN Outcome: Progressing   Problem: Clinical Measurements: Goal: Ability to maintain clinical measurements within normal limits will improve 10/12/2020 0646 by Darolyn Rua, RN Outcome: Progressing 10/11/2020 1917 by Darolyn Rua, RN Outcome: Progressing Goal: Will remain free from infection 10/12/2020 0646 by Darolyn Rua, RN Outcome: Progressing 10/11/2020 1917 by Darolyn Rua, RN Outcome: Progressing Goal: Diagnostic test results will improve 10/12/2020 0646 by Darolyn Rua, RN Outcome: Progressing 10/11/2020 1917 by Darolyn Rua, RN Outcome: Progressing Goal: Respiratory complications will improve 10/12/2020 0646 by Darolyn Rua, RN Outcome: Progressing 10/11/2020 1917 by Darolyn Rua, RN Outcome: Progressing Goal: Cardiovascular complication will be avoided 10/12/2020 0646 by Darolyn Rua, RN Outcome: Progressing 10/11/2020 1917 by Darolyn Rua, RN Outcome: Progressing   Problem: Activity: Goal: Risk for activity intolerance will decrease 10/12/2020 0646 by Darolyn Rua, RN Outcome: Progressing 10/11/2020 1917 by Darolyn Rua, RN Outcome: Progressing   Problem: Nutrition: Goal: Adequate nutrition will be maintained 10/12/2020 0646 by Darolyn Rua, RN Outcome: Progressing 10/11/2020 1917 by Darolyn Rua, RN Outcome: Progressing   Problem: Coping: Goal: Level of anxiety will decrease 10/12/2020 0646 by Darolyn Rua, RN Outcome: Progressing 10/11/2020 1917 by Darolyn Rua, RN Outcome: Progressing   Problem: Elimination: Goal: Will not experience complications related to bowel motility 10/12/2020 0646 by Darolyn Rua, RN Outcome: Progressing 10/11/2020 1917 by Darolyn Rua, RN Outcome: Progressing Goal: Will not experience complications related to urinary retention 10/12/2020 0646 by Darolyn Rua, RN Outcome: Progressing 10/11/2020 1917 by Darolyn Rua, RN Outcome: Progressing   Problem: Pain Managment: Goal: General experience of comfort will improve 10/12/2020 0646 by Darolyn Rua, RN Outcome: Progressing 10/11/2020 1917 by Darolyn Rua, RN Outcome: Progressing   Problem: Safety: Goal: Ability to remain free from injury will improve 10/12/2020 0646 by Darolyn Rua, RN Outcome: Progressing 10/11/2020 1917 by Darolyn Rua, RN Outcome: Progressing   Problem: Skin Integrity: Goal: Risk for impaired skin integrity will decrease 10/12/2020 0646 by Darolyn Rua, RN Outcome: Progressing 10/11/2020 1917 by Darolyn Rua, RN Outcome: Progressing   Problem: Education: Goal: Knowledge of disease or condition will improve 10/12/2020 0646 by Darolyn Rua, RN Outcome: Progressing 10/11/2020 1917 by Darolyn Rua, RN Outcome: Progressing Goal: Knowledge of the prescribed therapeutic regimen will improve 10/12/2020 0646 by Darolyn Rua, RN Outcome: Progressing 10/11/2020 1917 by Darolyn Rua, RN Outcome: Progressing Goal: Individualized Educational Video(s) 10/12/2020 0646 by Darolyn Rua, RN Outcome: Progressing 10/11/2020 1917 by  Darolyn Rua, RN Outcome: Progressing   Problem: Activity: Goal: Ability to tolerate increased activity will improve 10/12/2020 0646 by Darolyn Rua, RN Outcome: Progressing 10/11/2020 1917 by Darolyn Rua, RN Outcome: Progressing Goal: Will verbalize the importance of balancing activity with adequate rest periods 10/12/2020 0646 by Darolyn Rua, RN Outcome: Progressing 10/11/2020 1917 by Darolyn Rua, RN Outcome: Progressing   Problem: Respiratory:  Goal: Ability to maintain a clear airway will improve 10/12/2020 0646 by Darolyn Rua, RN Outcome: Progressing 10/11/2020 1917 by Darolyn Rua, RN Outcome: Progressing Goal: Levels of oxygenation will improve 10/12/2020 0646 by Darolyn Rua, RN Outcome: Progressing 10/11/2020 1917 by Darolyn Rua, RN Outcome: Progressing Goal: Ability to maintain adequate ventilation will improve 10/12/2020 0646 by Darolyn Rua, RN Outcome: Progressing 10/11/2020 1917 by Darolyn Rua, RN Outcome: Progressing   Problem: Skin Integrity: Goal: Risk for impaired skin integrity will decrease 10/12/2020 0646 by Darolyn Rua, RN Outcome: Progressing 10/11/2020 1917 by Darolyn Rua, RN Outcome: Progressing   Problem: Education: Goal: Knowledge of disease or condition will improve 10/12/2020 0646 by Darolyn Rua, RN Outcome: Progressing 10/11/2020 1917 by Darolyn Rua, RN Outcome: Progressing   Problem: Education: Goal: Knowledge of the prescribed therapeutic regimen will improve 10/12/2020 0646 by Darolyn Rua, RN Outcome: Progressing 10/11/2020 1917 by Darolyn Rua, RN Outcome: Progressing   Problem: Education: Goal: Individualized Educational Video(s) 10/12/2020 0646 by Darolyn Rua, RN Outcome: Progressing 10/11/2020 1917 by Darolyn Rua, RN Outcome: Progressing   Problem: Activity: Goal: Ability to tolerate increased activity will improve 10/12/2020 0646 by Darolyn Rua,  RN Outcome: Progressing 10/11/2020 1917 by Darolyn Rua, RN Outcome: Progressing   Problem: Activity: Goal: Will verbalize the importance of balancing activity with adequate rest periods 10/12/2020 0646 by Darolyn Rua, RN Outcome: Progressing 10/11/2020 1917 by Darolyn Rua, RN Outcome: Progressing   Problem: Respiratory: Goal: Ability to maintain a clear airway will improve 10/12/2020 0646 by Darolyn Rua, RN Outcome: Progressing 10/11/2020 1917 by Darolyn Rua, RN Outcome: Progressing   Problem: Respiratory: Goal: Ability to maintain adequate ventilation will improve 10/12/2020 0646 by Darolyn Rua, RN Outcome: Progressing 10/11/2020 1917 by Darolyn Rua, RN Outcome: Progressing

## 2020-10-12 NOTE — Progress Notes (Signed)
[facility] Room 220 [facility]) Hospital Liaison RN note:  Received request from Morton Stall, NP for hospice services at home after discharge. Chart and patient information reviewed by [facility] physician and hospice eligibility has been approved.  Spoke with patient in room along with his cousin Lucy Antigua via phone to initiate education related to hospice philosophy, services provided and to answer any questions. Patient and his cousin verbalized understanding of information given and had no questions. Unsure of discharge date at this time, but Reita Cliche did say to contact him at discharge and he would pick up patient. His number is [phone].  DME needs discussed. Patient and family requests the following equipment: Hospital bed, OBT, Wheelchair, walker, showerchair, 3 in 1, O2 and portable and nebulizer. Have discussed with Dr. Marylu Lund and she will update on whether patient will need a nebulizer at home.  Address on facesheet is not where patient will be discharged to. He will now live with his cousin Lucy Antigua. Contact phone # is [phone]. Address is [address] [place], [place] [address]. Please contact them to arrange time of equipment delivery.  Please send signed and completed DNR home with patient/family. Please provide prescriptions at discharge as needed to ensure ongoing symptom management.  [facility] information and contact numbers given to patient and family. Above information shared with hospital care team.  Please call with any hospice related questions or concerns.  Thank you for the opportunity to participate in this patient's care.  Cyndra Numbers, RN [facility] Liaison [phone]

## 2020-10-12 NOTE — Progress Notes (Signed)
Progress Note  Patient Name: Kurt Blankenship Date of Encounter: 10/12/2020  Primary Cardiologist: End  Subjective   Dyspnea a little improved. No chest pain. Felt like some of his dinner got stuck last night. Has had issues with dysphagia previously. Lower extremity swelling unchanged. Documented UOP 680 mL over the past 24 hours with a net - 1.7 L for the admission. Weight 88.2-->85.4 kg. Labs pending this morning. BP stable.   Inpatient Medications    Scheduled Meds: . aspirin EC  81 mg Oral Daily  . atorvastatin  20 mg Oral QHS  . carvedilol  3.125 mg Oral BID WC  . furosemide  40 mg Intravenous BID  . gabapentin  300 mg Oral BID  . heparin  5,000 Units Subcutaneous Q8H  . Ipratropium-Albuterol  1 puff Inhalation Q6H WA  . methylPREDNISolone (SOLU-MEDROL) injection  40 mg Intravenous Q12H  . nicotine  21 mg Transdermal Daily   Continuous Infusions:  PRN Meds: acetaminophen **OR** acetaminophen, acetaminophen **OR** acetaminophen, albuterol, ondansetron **OR** ondansetron (ZOFRAN) IV, oxyCODONE, traZODone   Vital Signs    Vitals:   10/11/20 2241 10/12/20 0324 10/12/20 0500 10/12/20 0719  BP:  122/89  (!) 105/91  Pulse:  100  95  Resp:  20  17  Temp: 98.6 F (37 C) 97.9 F (36.6 C)  98 F (36.7 C)  TempSrc: Rectal Oral    SpO2:  100%  97%  Weight:   85.4 kg   Height:        Intake/Output Summary (Last 24 hours) at 10/12/2020 0801 Last data filed at 10/12/2020 0700 Gross per 24 hour  Intake 720 ml  Output 1400 ml  Net -680 ml   Filed Weights   10/10/20 1528 10/11/20 0434 10/12/20 0500  Weight: 84.8 kg 88.2 kg 85.4 kg    Telemetry    SR with short run of atrial tachycardia/SVT in the evening of 10/26 - Personally Reviewed  ECG    No new tracings - Personally Reviewed  Physical Exam   GEN: No acute distress.   Neck: JVD elevated ~ 10 cm. Cardiac: RRR, no murmurs, rubs, or gallops.  Respiratory: Scattered wheezing bilaterally with bibasilar  crackles.  GI: Soft, nontender, non-distended.   MS: 1-2+ bilateral lower extremity pitting edema; No deformity. Neuro:  Alert and oriented x 3; Nonfocal.  Psych: Normal affect.  Labs    Chemistry Recent Labs  Lab 10/08/20 0411 10/10/20 1547 10/11/20 0511  NA 132* 135 131*  K 4.2 3.7 5.3*  CL 94* 93* 92*  CO2 29 33* 31  GLUCOSE 194* 159* 217*  BUN 27* 18 18  CREATININE 0.71 0.65 0.67  CALCIUM 9.0 9.4 9.1  PROT  --  7.1 5.9*  ALBUMIN  --  4.1 3.5  AST  --  34 36  ALT  --  68* 62*  ALKPHOS  --  71 64  BILITOT  --  1.4* 0.9  GFRNONAA >60 >60 >60  ANIONGAP 9 9 8      Hematology Recent Labs  Lab 10/08/20 0411 10/10/20 1547 10/11/20 0511  WBC 6.3 10.1 8.3  RBC 4.04* 4.77 4.38  HGB 14.2 16.4 15.1  HCT 40.7 48.9 45.2  MCV 100.7* 102.5* 103.2*  MCH 35.1* 34.4* 34.5*  MCHC 34.9 33.5 33.4  RDW 12.8 13.0 12.8  PLT 180 253 210    Cardiac EnzymesNo results for input(s): TROPONINI in the last 168 hours. No results for input(s): TROPIPOC in the last 168 hours.  BNP Recent Labs  Lab 10/05/20 1221 10/10/20 1547  BNP 1,305.7* 973.5*     DDimer No results for input(s): DDIMER in the last 168 hours.   Radiology    DG Chest Portable 1 View  Result Date: 10/10/2020 IMPRESSION: 1. Cardiomegaly with vascular congestion. 2. Increased airspace disease at both bases with probable right pleural effusion. Electronically Signed   By: Jasmine Pang M.D.   On: 10/10/2020 16:08    Cardiac Studies   2D echo 06/08/2020: 1. Left ventricular ejection fraction, by estimation, is 20 to 25%. The  left ventricle has severely decreased function. The left ventricle  demonstrates global hypokinesis. Left ventricular diastolic function could  not be evaluated.  2. Right ventricular systolic function is normal. The right ventricular  size is mildly enlarged.  3. The mitral valve was not well visualized. No evidence of mitral valve  regurgitation.  4. The aortic valve was not well  visualized. Aortic valve regurgitation  not well assessed.  5. The inferior vena cava is dilated in size with >50% respiratory  variability, suggesting right atrial pressure of 8 mmHg.  __________  [facility] 06/10/2020: Conclusions: 1. Mild to moderate, nonobstructive coronary artery disease with up to 50% stenoses involving small D1 branch as well as mid RCA. Systolic dysfunction is out of proportion to CAD, consistent with nonischemic cardiomyopathy. 2. Mildly elevated left and right heart filling pressures. 3. Low normal to mildly reduced Fick cardiac output.  Recommendations: 1. Optimize goal-directed medical therapy for management of nonischemic cardiomyopathy. Smoking, tobacco, and drug cessation encouraged, as cardiomyopathy certainly could be related to substance abuse. 2. Medical therapy and risk factor modification to prevent progression of coronary artery disease. __________  2D echo 10/06/2020: 1. Left ventricular ejection fraction, by estimation, is 20 to 25%. The  left ventricle has severely decreased function. The left ventricle has no  regional wall motion abnormalities. The left ventricular internal cavity  size was moderately dilated. Left  ventricular diastolic parameters were normal.  2. Right ventricular systolic function is moderately reduced. The right  ventricular size is normal. Tricuspid regurgitation signal is inadequate  for assessing PA pressure.  3. Mild mitral valve regurgitation.  4. The inferior vena cava is dilated in size with >50% respiratory  variability, suggesting right atrial pressure of 8 mmHg.  Patient Profile     64 y.o. male with history of BiV failure secondary to nonischemic cardiomyopathy, polysubstance use including cocaine, alcohol, and tobacco use, COPD, chronic low back pain on narcotic pain medication, HTN, and family history of CAD with his brother dying from an MI in his 31s and mother passing in her 68s from possible heart  failure  who is being seen today for the evaluation of acute on chronic HFrEF at the request of Dr. Allena Katz.  Assessment & Plan    1. Acute on chronic hypoxic respiratory failure:  -Multifactorial including acute on chronic HFrEF with RV dysfunction and acute COPD exacerbation in the context of nonadherence with medical therapy  -Supplemental oxygen  2. Acute on chronic HFrEF with RV dysfunction secondary to NICM:  -He continues to appear volume overloaded -Continue IV Lasix 40 mg twice daily, likely for the next couple of days -Continue carvedilol -Losartan currently being held for hyperkalemia which was likely in the setting of the patient receiving lactated Ringer's and KCl, resume if able based on BMP and BP today -Escalate GDMT with resumption of ARB and potential addition of MRA prior to discharge or if needed in the  outpatient setting -Could consider transition to [facility] in outpatient follow-up -Daily weights -Strict I's and O's -Consider Reds vest in a day or 2 following diuresis -If he demonstrates adherence to medical regimen and outpatient follow-up and in this setting is medically optimized with GDMT, would recommend repeating limited echo in several months time to evaluate for improvement in LV systolic function.  If at that time, despite medical compliance and maximally tolerated evidence-based medical therapy, his EF remains less than 35% would recommend referral to EP for consideration of ICD  3. Elevated troponin with nonobstructive CAD:  -Minimally elevated and nontrending not consistent with ACS  -EKG nonacute  -Recent diagnostic cath demonstrated nonobstructive disease in 05/2020 as outlined above  -Continue current medical management including aspirin, carvedilol -No indication for heparin drip or ischemic testing in the inpatient setting at this time  4. Polysubstance use:  -Repeat urine drug screen  -Complete cessation is recommended   5. COPD exacerbation:    -Management per internal medicine  -Recommend tapering steroid use when possible  6.  Hyperkalemia: -No longer receiving LR -Off KCl -If renal function and potassium are improved on BMP today, recommend resuming losartan, if BP allows -Currently no indication for Kayexalate  7.  HLD: -LDL of 73 on 10/06/2020 with goal being less than 70 -Now on Lipitor   8.  Palpitations/atrial tachycardia: -He reports a history of palpitations though denies any earlier in the day today when there was documented atrial tach on telemetry -Continue carvedilol as above -Potassium 5.3 currently, magnesium 2.0 -UDS pending -TSH normal  9. Atrial tachycardia: -Asymptomatic -Coreg as above -TSH normal -Potassium pending this morning -Magnesium at goal  10. Dysphagia: -Per IM  For questions or updates, please contact [facility] Please consult [email or web address] for contact info under Cardiology/STEMI.    Signed, Eula Listen, PA-C [facility] Pager: [phone] 10/12/2020, 8:01 AM

## 2020-10-12 NOTE — TOC Initial Note (Signed)
Transition of Care [facility]) - Initial/Assessment Note    Patient Details  Name: Kurt Blankenship MRN: [id number] Date of Birth: 27-Apr-1956  Transition of Care [facility]) CM/SW Contact:    Chapman Fitch, RN Phone Number: 10/12/2020, 2:28 PM  Clinical Narrative:                 Patient admitted from home with heart failure.   Patient left this facility AMA on 10/23 Open with [facility] home health  At discharge patient will be staying with his cousin Lucy Antigua. Contact phone # is [phone]. Address is [address] [place], [place] [address]   Patient has O2, nebulizer through [employer].  Requesting hoverround, hospital bed  and Rw.  Plan to discharge home with hospice services. Patient Clinical research associate.  Referral made to [facility] with Civil engineer, contracting.  She is aware of the his DME requests   Expected Discharge Plan: Home w Hospice Care Barriers to Discharge: Continued Medical Work up   Patient Goals and CMS Choice     Choice offered to / list presented to : Patient  Expected Discharge Plan and Services Expected Discharge Plan: Home w Hospice Care     Post Acute Care Choice: Hospice Living arrangements for the past 2 months: Single Family Home                                      Prior Living Arrangements/Services Living arrangements for the past 2 months: Single Family Home Lives with:: Roommate Patient language and need for interpreter reviewed:: Yes Do you feel safe going back to the place where you live?: Yes      Need for Family Participation in Patient Care: Yes (Comment) Care giver support system in place?: Yes (comment) Current home services: DME Criminal Activity/Legal Involvement Pertinent to Current Situation/Hospitalization: No - Comment as needed  Activities of Daily Living Home Assistive Devices/Equipment: Oxygen, Nebulizer (pt on 3-4 L of O2 at home) ADL Screening (condition at time of admission) Patient's cognitive ability  adequate to safely complete daily activities?: No Is the patient deaf or have difficulty hearing?: No Does the patient have difficulty seeing, even when wearing glasses/contacts?: No Does the patient have difficulty concentrating, remembering, or making decisions?: No Patient able to express need for assistance with ADLs?: Yes Does the patient have difficulty dressing or bathing?: Yes Independently performs ADLs?: Yes (appropriate for developmental age) Does the patient have difficulty walking or climbing stairs?: No Weakness of Legs: Both Weakness of Arms/Hands: None  Permission Sought/Granted                  Emotional Assessment       Orientation: : Oriented to Self, Oriented to Place, Oriented to  Time, Oriented to Situation   Psych Involvement: No (comment)  Admission diagnosis:  Acute on chronic diastolic heart failure (HCC) [I50.33] COPD exacerbation (HCC) [J44.1] Acute on chronic systolic congestive heart failure (HCC) [I50.23] Acute on chronic respiratory failure with hypoxia and hypercapnia (HCC) [J96.21, J96.22] Sepsis due to gram-negative UTI (HCC) [A41.50, N39.0] Patient Active Problem List   Diagnosis Date Noted  . SOB (shortness of breath) 10/11/2020  . Acute respiratory failure with hypoxia (HCC) 10/11/2020  . Acute on chronic respiratory failure with hypoxia and hypercapnia (HCC)   . Acute on chronic diastolic heart failure (HCC) 10/10/2020  . Sepsis due to gram-negative UTI (HCC) 10/10/2020  . COPD  exacerbation (HCC) 10/05/2020  . Acute on chronic systolic CHF (congestive heart failure) (HCC) 10/05/2020  . Elevated troponin 10/05/2020  . HTN (hypertension) 10/05/2020  . HLD (hyperlipidemia) 10/05/2020  . Alcohol use   . Tobacco abuse   . Cardiomyopathy (HCC)   . Syncope 06/08/2020  . Syncope and collapse 06/08/2020  . Hypertension   . Hypokalemia   . COPD (chronic obstructive pulmonary disease) (HCC) 05/15/2016   PCP:  System, Provider Not  In Pharmacy:   [id number] - [place], [place] - [address] [place] [address] Phone: [phone] Fax: [phone]  [facility] - [place], [place] - [address] [place] [address] Phone: [phone] Fax: [phone]     Social Determinants of Health (SDOH) Interventions    Readmission Risk Interventions No flowsheet data found.

## 2020-10-12 NOTE — Progress Notes (Addendum)
PROGRESS NOTE    Kurt Blankenship  GQQ:[id number] DOB: January 15, 1956 DOA: 10/10/2020 PCP: System, Provider Not In    Brief Narrative:  Kurt Blankenship is a 64 y.o. male with medical history significant for chronic systolic heart failure with most recent echocardiogram in October 2021 showing LVEF 20 to 25%, hypertension, COPD, chronic tobacco abuse, chronic pain syndrome, who is admitted to [facility] on 10/10/2020 with acute hypoxic respiratory failure in the setting of acute on chronic systolic heart failure as well as acute COPD exacerbation after presenting from home to [facility] complaining of shortness of breath.   10/27- pt has decided to go home with hospice once stable.  Consultants:   Cardiology, palliative care  Procedures:   Antimicrobials:       Subjective: Patient reporting less short of breath than before and has been urinating a lot. No other complaints  Objective: Vitals:   10/12/20 0500 10/12/20 0719 10/12/20 1143 10/12/20 1515  BP:  (!) 105/91 (!) 125/92 116/86  Pulse:  95 96 88  Resp:  17 16 16   Temp:  98 F (36.7 C) 97.9 F (36.6 C) 98 F (36.7 C)  TempSrc:      SpO2:  97% 99% 94%  Weight: 85.4 kg     Height:        Intake/Output Summary (Last 24 hours) at 10/12/2020 1724 Last data filed at 10/12/2020 1456 Gross per 24 hour  Intake 240 ml  Output 2100 ml  Net -1860 ml   Filed Weights   10/10/20 1528 10/11/20 0434 10/12/20 0500  Weight: 84.8 kg 88.2 kg 85.4 kg    Examination:  General exam: Appears calm and comfortable  Respiratory system: Clear to auscultation. Respiratory effort normal. Cardiovascular system: S1 & S2 heard, RRR. +JVD, no murmurs Gastrointestinal system: Abdomen is mild distention, +edema.soft,nt Central nervous system: Alert and oriented. Grossly intact Extremities: +pitting edema b/l Skin: Warm and dry Psychiatry: Judgement and insight appear normal. Mood & affect  appropriate.     Data Reviewed: I have personally reviewed following labs and imaging studies  CBC: Recent Labs  Lab 10/06/20 0409 10/08/20 0411 10/10/20 1547 10/11/20 0511  WBC 7.4 6.3 10.1 8.3  NEUTROABS  --   --  7.2  --   HGB 14.5 14.2 16.4 15.1  HCT 42.5 40.7 48.9 45.2  MCV 103.2* 100.7* 102.5* 103.2*  PLT 188 180 253 210   Basic Metabolic Panel: Recent Labs  Lab 10/06/20 0409 10/06/20 0409 10/07/20 0509 10/08/20 0411 10/10/20 1547 10/11/20 0511 10/12/20 0900  NA 132*   < > 135 132* 135 131* 132*  K 4.3   < > 4.3 4.2 3.7 5.3* 5.6*  CL 95*   < > 95* 94* 93* 92* 90*  CO2 29   < > 32 29 33* 31 33*  GLUCOSE 175*   < > 80 194* 159* 217* 189*  BUN 18   < > 26* 27* 18 18 26*  CREATININE 0.85   < > 1.05 0.71 0.65 0.67 0.94  CALCIUM 9.0   < > 9.4 9.0 9.4 9.1 9.8  MG 2.1  --   --   --  2.1 2.0  --   PHOS  --   --   --   --   --  4.0  --    < > = values in this interval not displayed.   GFR: Estimated Creatinine Clearance: 87.1 mL/min (by C-G formula based on SCr of  0.94 mg/dL). Liver Function Tests: Recent Labs  Lab 10/10/20 1547 10/11/20 0511  AST 34 36  ALT 68* 62*  ALKPHOS 71 64  BILITOT 1.4* 0.9  PROT 7.1 5.9*  ALBUMIN 4.1 3.5   No results for input(s): LIPASE, AMYLASE in the last 168 hours. No results for input(s): AMMONIA in the last 168 hours. Coagulation Profile: Recent Labs  Lab 10/05/20 1857 10/11/20 0511  INR 1.1 1.1   Cardiac Enzymes: No results for input(s): CKTOTAL, CKMB, CKMBINDEX, TROPONINI in the last 168 hours. BNP (last 3 results) No results for input(s): PROBNP in the last 8760 hours. HbA1C: No results for input(s): HGBA1C in the last 72 hours. CBG: Recent Labs  Lab 10/07/20 1115 10/07/20 1626  GLUCAP 84 181*   Lipid Profile: No results for input(s): CHOL, HDL, LDLCALC, TRIG, CHOLHDL, LDLDIRECT in the last 72 hours. Thyroid Function Tests: Recent Labs    10/11/20 0511  TSH 0.484   Anemia Panel: No results for  input(s): VITAMINB12, FOLATE, FERRITIN, TIBC, IRON, RETICCTPCT in the last 72 hours. Sepsis Labs: Recent Labs  Lab 10/11/20 0511  PROCALCITON <0.10    Recent Results (from the past 240 hour(s))  Respiratory Panel by RT PCR (Flu A&B, Covid) - Nasopharyngeal Swab     Status: None   Collection Time: 10/05/20  1:25 PM   Specimen: Nasopharyngeal Swab  Result Value Ref Range Status   SARS Coronavirus 2 by RT PCR NEGATIVE NEGATIVE Final    Comment: (NOTE) SARS-CoV-2 target nucleic acids are NOT DETECTED.  The SARS-CoV-2 RNA is generally detectable in upper respiratoy specimens during the acute phase of infection. The lowest concentration of SARS-CoV-2 viral copies this assay can detect is 131 copies/mL. A negative result does not preclude SARS-Cov-2 infection and should not be used as the sole basis for treatment or other patient management decisions. A negative result may occur with  improper specimen collection/handling, submission of specimen other than nasopharyngeal swab, presence of viral mutation(s) within the areas targeted by this assay, and inadequate number of viral copies (<131 copies/mL). A negative result must be combined with clinical observations, patient history, and epidemiological information. The expected result is Negative.  Fact Sheet for Patients:  https://www.moore.com/  Fact Sheet for Healthcare Providers:  https://www.young.biz/  This test is no t yet approved or cleared by the [place] FDA and  has been authorized for detection and/or diagnosis of SARS-CoV-2 by FDA under an Emergency Use Authorization (EUA). This EUA will remain  in effect (meaning this test can be used) for the duration of the COVID-19 declaration under Section 564(b)(1) of the Act, 21 U.S.C. section 360bbb-3(b)(1), unless the authorization is terminated or revoked sooner.     Influenza A by PCR NEGATIVE NEGATIVE Final   Influenza B by PCR  NEGATIVE NEGATIVE Final    Comment: (NOTE) The Xpert Xpress SARS-CoV-2/FLU/RSV assay is intended as an aid in  the diagnosis of influenza from Nasopharyngeal swab specimens and  should not be used as a sole basis for treatment. Nasal washings and  aspirates are unacceptable for Xpert Xpress SARS-CoV-2/FLU/RSV  testing.  Fact Sheet for Patients: https://www.moore.com/  Fact Sheet for Healthcare Providers: https://www.young.biz/  This test is not yet approved or cleared by the [place] FDA and  has been authorized for detection and/or diagnosis of SARS-CoV-2 by  FDA under an Emergency Use Authorization (EUA). This EUA will remain  in effect (meaning this test can be used) for the duration of the  Covid-19 declaration under Section  564(b)(1) of the Act, 21  U.S.C. section 360bbb-3(b)(1), unless the authorization is  terminated or revoked. Performed at [facility], [address] [place], [place] [address]   Respiratory Panel by RT PCR (Flu A&B, Covid) - Nasopharyngeal Swab     Status: None   Collection Time: 10/10/20  4:24 PM   Specimen: Nasopharyngeal Swab  Result Value Ref Range Status   SARS Coronavirus 2 by RT PCR NEGATIVE NEGATIVE Final    Comment: (NOTE) SARS-CoV-2 target nucleic acids are NOT DETECTED.  The SARS-CoV-2 RNA is generally detectable in upper respiratoy specimens during the acute phase of infection. The lowest concentration of SARS-CoV-2 viral copies this assay can detect is 131 copies/mL. A negative result does not preclude SARS-Cov-2 infection and should not be used as the sole basis for treatment or other patient management decisions. A negative result may occur with  improper specimen collection/handling, submission of specimen other than nasopharyngeal swab, presence of viral mutation(s) within the areas targeted by this assay, and inadequate number of viral copies (<131 copies/mL). A negative result  must be combined with clinical observations, patient history, and epidemiological information. The expected result is Negative.  Fact Sheet for Patients:  https://www.moore.com/  Fact Sheet for Healthcare Providers:  https://www.young.biz/  This test is no t yet approved or cleared by the [place] FDA and  has been authorized for detection and/or diagnosis of SARS-CoV-2 by FDA under an Emergency Use Authorization (EUA). This EUA will remain  in effect (meaning this test can be used) for the duration of the COVID-19 declaration under Section 564(b)(1) of the Act, 21 U.S.C. section 360bbb-3(b)(1), unless the authorization is terminated or revoked sooner.     Influenza A by PCR NEGATIVE NEGATIVE Final   Influenza B by PCR NEGATIVE NEGATIVE Final    Comment: (NOTE) The Xpert Xpress SARS-CoV-2/FLU/RSV assay is intended as an aid in  the diagnosis of influenza from Nasopharyngeal swab specimens and  should not be used as a sole basis for treatment. Nasal washings and  aspirates are unacceptable for Xpert Xpress SARS-CoV-2/FLU/RSV  testing.  Fact Sheet for Patients: https://www.moore.com/  Fact Sheet for Healthcare Providers: https://www.young.biz/  This test is not yet approved or cleared by the [place] FDA and  has been authorized for detection and/or diagnosis of SARS-CoV-2 by  FDA under an Emergency Use Authorization (EUA). This EUA will remain  in effect (meaning this test can be used) for the duration of the  Covid-19 declaration under Section 564(b)(1) of the Act, 21  U.S.C. section 360bbb-3(b)(1), unless the authorization is  terminated or revoked. Performed at [facility], [address] [place], [place] [address]          Radiology Studies: No results found.      Scheduled Meds: . aspirin EC  81 mg Oral Daily  . atorvastatin  20 mg Oral QHS  . carvedilol   3.125 mg Oral BID WC  . enoxaparin (LOVENOX) injection  40 mg Subcutaneous Q24H  . furosemide  40 mg Intravenous BID  . gabapentin  300 mg Oral BID  . Ipratropium-Albuterol  1 puff Inhalation Q6H WA  . methylPREDNISolone (SOLU-MEDROL) injection  40 mg Intravenous Q12H  . nicotine  21 mg Transdermal Daily   Continuous Infusions:  Assessment & Plan:   Principal Problem:   Acute on chronic systolic CHF (congestive heart failure) (HCC) Active Problems:   Hypertension   COPD exacerbation (HCC)   HLD (hyperlipidemia)   Tobacco abuse  SOB (shortness of breath)   Acute respiratory failure with hypoxia (HCC)   Acute on chronic respiratory failure with hypoxia and hypercapnia (HCC)   Acute on chronic systolic CHF (congestive heart failure) (HCC): -2D echo on 6/20//21 and 10/06/20 showed EF of 20-25%.  -BNP was elevated  Appears still fluid overloaded  Cardiology following Continue optimizing his medical care, continue with Lasix IV twice daily I's and O's Continue Coreg Losartan was on hold due to soft blood pressure and elevated K Plan is patient to go home with hospice care once medical care is optimized   COPD exacerbation (HCC):has improved. Will d/c iv steroids as it will cause fluid retention Continue inhalers wears chronic oxygen at home however appears to be noncompliant    Elevated troponin:Troponin 41-->41.  Likely due to demand ischemia No chest pain Continue aspirin, Lipitor and Coreg  Hyperkalemia-K mildly more elevated today at 5.6 We will give dose of Kayexalate Monitor levels  HTN (hypertension) -IV hydralazine as needed -Continue Coreg -holding losartan  HLD (hyperlipidemia):  Was on statin therapy in the past but was not taking it Continue statin  h/o Tobacco abuse and Alcohol abuse: -Was counseled on quitting  Continue nicotine patch  -watch for withdrawal symptoms from alcohol  Hyperglycemia suspect steroid induced -A1c  5.6 10/27 has improved  palliative care consultation. home with hospice   DVT prophylaxis: Lovenox  code Status: DNR Family Communication: None at bedside   Status is: Inpatient  Remains inpatient appropriate because:IV treatments appropriate due to intensity of illness or inability to take PO   Dispo: The patient is from: Home              Anticipated d/c is to: Home              Anticipated d/c date is: 1 day              Patient currently is not medically stable to d/c.needs iv lasix .            LOS: 2 days   Time spent: 45 min with >50% on coc    Lynn Ito, MD [facility] Pager [phone]  If 7PM-7AM, please contact night-coverage [email or web address] Password [facility] 10/12/2020, 5:24 PM

## 2020-10-12 NOTE — Progress Notes (Signed)
   10/11/20 2140  Assess: MEWS Score  Temp (!) 93.9 F (34.4 C)  BP (!) 137/103 (RN Celine Notified @ this time)  Pulse Rate 87  Resp (!) 24  Level of Consciousness Alert  SpO2 (!) 88 % (RN Celine Notified @ this time)  O2 Device Room Air  Assess: MEWS Score  MEWS Temp 2  MEWS Systolic 0  MEWS Pulse 0  MEWS RR 1  MEWS LOC 0  MEWS Score 3  MEWS Score Color Yellow  Assess: if the MEWS score is Yellow or Red  Were vital signs taken at a resting state? Yes  Focused Assessment No change from prior assessment  Early Detection of Sepsis Score *See Row Information* Low  MEWS guidelines implemented *See Row Information* Yes  Treat  MEWS Interventions Administered scheduled meds/treatments;Administered prn meds/treatments  Pain Scale 0-10  Pain Score 3  Pain Type Chronic pain  Pain Location Back  Pain Orientation Anterior;Posterior  Pain Radiating Towards anterior  Pain Descriptors / Indicators Tightness  Pain Frequency Intermittent  Pain Onset On-going  Patients Stated Pain Goal 0  Pain Intervention(s) Medication (See eMAR)  Multiple Pain Sites Yes  Complains of Nausea /  Vomiting  Nausea relieved by Antiemetic (Zofran)  Patients response to intervention Relief  Take Vital Signs  Increase Vital Sign Frequency   (pt refused further VS, states he just wants to sleep)  Escalate  MEWS: Escalate  (No)  Notify: Charge Nurse/RN  Name of Charge Nurse/RN Notified Christal B,  Date Charge Nurse/RN Notified 10/11/20  Time Charge Nurse/RN Notified 2227  Notify: Provider  Provider Name/Title Manuela Schwartz  Date Provider Notified 10/11/20  Time Provider Notified 2227  Notification Type Page  Notification Reason Change in status  Response See new orders  Date of Provider Response 10/11/20  Time of Provider Response 2227  Document  Patient Outcome Stabilized after interventions (increased room temp. use of rectal thermomether, warm blanke)  Progress note created (see row  info) Yes (Pain med administered as well)  copy and pasted for Celine M

## 2020-10-12 NOTE — Progress Notes (Signed)
Tele center contacted this Nurse and reported that pt had a brief run of SVT 150's then back down to SR 90'S. Vitals taken and pt went from green to yellow. Low Temp 93.61f and high BP. Pt continue to refuse to put his o2 on. He states it makes him nauseous. Zofran IV adm. Rectal temp to be used to confirm reading. Increased room temp. Pt states he feels fine. Some wheezing noted. NP Manuela Schwartz notified and gave order to provide warm blanket.

## 2020-10-13 DIAGNOSIS — J9621 Acute and chronic respiratory failure with hypoxia: Secondary | ICD-10-CM

## 2020-10-13 DIAGNOSIS — J441 Chronic obstructive pulmonary disease with (acute) exacerbation: Secondary | ICD-10-CM

## 2020-10-13 DIAGNOSIS — Z720 Tobacco use: Secondary | ICD-10-CM | POA: Diagnosis not present

## 2020-10-13 DIAGNOSIS — J9622 Acute and chronic respiratory failure with hypercapnia: Secondary | ICD-10-CM

## 2020-10-13 DIAGNOSIS — I5023 Acute on chronic systolic (congestive) heart failure: Secondary | ICD-10-CM | POA: Diagnosis not present

## 2020-10-13 LAB — BASIC METABOLIC PANEL
Anion gap: 9 (ref 5–15)
BUN: 27 mg/dL — ABNORMAL HIGH (ref 8–23)
CO2: 36 mmol/L — ABNORMAL HIGH (ref 22–32)
Calcium: 9.7 mg/dL (ref 8.9–10.3)
Chloride: 91 mmol/L — ABNORMAL LOW (ref 98–111)
Creatinine, Ser: 0.91 mg/dL (ref 0.61–1.24)
GFR, Estimated: >60 mL/min (ref >60)
Glucose, Bld: 86 mg/dL (ref 70–99)
Potassium: 4.7 mmol/L (ref 3.5–5.1)
Sodium: 136 mmol/L (ref 135–145)

## 2020-10-13 MED ORDER — FUROSEMIDE 10 MG/ML IJ SOLN
INTRAMUSCULAR | Status: AC
  Filled 2020-10-13: qty 4

## 2020-10-13 MED ORDER — FUROSEMIDE 10 MG/ML IJ SOLN
40.0000 mg | Freq: Three times a day (TID) | INTRAMUSCULAR | Status: DC
  Administered 2020-10-13 – 2020-10-16 (×8): 40 mg via INTRAVENOUS
  Filled 2020-10-13 (×8): qty 4

## 2020-10-13 MED ORDER — LOSARTAN POTASSIUM 25 MG PO TABS
12.5000 mg | ORAL_TABLET | Freq: Every day | ORAL | Status: DC
  Administered 2020-10-13 – 2020-10-14 (×2): 12.5 mg via ORAL
  Filled 2020-10-13 (×2): qty 1

## 2020-10-13 MED ORDER — FUROSEMIDE 10 MG/ML IJ SOLN
INTRAMUSCULAR | Status: AC
  Administered 2020-10-13: 40 mg via INTRAVENOUS
  Filled 2020-10-13: qty 4

## 2020-10-13 NOTE — Progress Notes (Signed)
Sepsis screen performed and is negative. Will continue to monitor. 

## 2020-10-13 NOTE — Progress Notes (Signed)
Progress Note  Patient Name: Kurt Blankenship Date of Encounter: 10/13/2020  Primary Cardiologist: End  Subjective   Breathing continues to improve.  No chest pain or palpitations. Abdominal distension is improving. Dyspnea a little improved. No chest pain. Felt like some of his dinner got stuck last night. Has had issues with dysphagia previously. Lower extremity swelling unchanged. Documented UOP 430 mL over the past 24 hours with a net - 2.1 L for the admission. Weight 85.4-->85.5 kg. Potassium improved and now normal. Renal function remains stable. BP well controlled.   Inpatient Medications    Scheduled Meds: . aspirin EC  81 mg Oral Daily  . atorvastatin  20 mg Oral QHS  . carvedilol  3.125 mg Oral BID WC  . enoxaparin (LOVENOX) injection  40 mg Subcutaneous Q24H  . furosemide  40 mg Intravenous BID  . gabapentin  300 mg Oral BID  . Ipratropium-Albuterol  1 puff Inhalation Q6H WA  . nicotine  21 mg Transdermal Daily   Continuous Infusions:  PRN Meds: acetaminophen **OR** acetaminophen, acetaminophen **OR** acetaminophen, albuterol, ondansetron **OR** ondansetron (ZOFRAN) IV, oxyCODONE, traZODone   Vital Signs    Vitals:   10/12/20 1515 10/12/20 2236 10/13/20 0239 10/13/20 0514  BP: 116/86 (!) 111/100 117/86 108/76  Pulse: 88 90 93 83  Resp: 16 (!) 24 20 20   Temp: 98 F (36.7 C) 98.2 F (36.8 C) 98.1 F (36.7 C) 97.8 F (36.6 C)  TempSrc:      SpO2: 94% 96% 91% (!) 89%  Weight:    85.5 kg  Height:        Intake/Output Summary (Last 24 hours) at 10/13/2020 0730 Last data filed at 10/13/2020 0514 Gross per 24 hour  Intake 1320 ml  Output 1750 ml  Net -430 ml   Filed Weights   10/11/20 0434 10/12/20 0500 10/13/20 0514  Weight: 88.2 kg 85.4 kg 85.5 kg    Telemetry    SR - Personally Reviewed  ECG    No new tracings - Personally Reviewed  Physical Exam   GEN: No acute distress.   Neck: JVD elevated ~ 10 cm. Cardiac: RRR, no murmurs, rubs, or  gallops.  Respiratory: Much improved breath sounds with faint wheezing bilaterally along with faint bibasilar crackles.  GI: Soft, nontender, mildly distended.   MS: 1-2+ bilateral lower extremity pitting edema; No deformity. Neuro:  Alert and oriented x 3; Nonfocal.  Psych: Normal affect.  Labs    Chemistry Recent Labs  Lab 10/10/20 1547 10/10/20 1547 10/11/20 0511 10/12/20 0900 10/13/20 0609  NA 135   < > 131* 132* 136  K 3.7   < > 5.3* 5.6* 4.7  CL 93*   < > 92* 90* 91*  CO2 33*   < > 31 33* 36*  GLUCOSE 159*   < > 217* 189* 86  BUN 18   < > 18 26* 27*  CREATININE 0.65   < > 0.67 0.94 0.91  CALCIUM 9.4   < > 9.1 9.8 9.7  PROT 7.1  --  5.9*  --   --   ALBUMIN 4.1  --  3.5  --   --   AST 34  --  36  --   --   ALT 68*  --  62*  --   --   ALKPHOS 71  --  64  --   --   BILITOT 1.4*  --  0.9  --   --   GFRNONAA >  60   < > >60 >60 >60  ANIONGAP 9   < > 8 9 9    < > = values in this interval not displayed.     Hematology Recent Labs  Lab 10/08/20 0411 10/10/20 1547 10/11/20 0511  WBC 6.3 10.1 8.3  RBC 4.04* 4.77 4.38  HGB 14.2 16.4 15.1  HCT 40.7 48.9 45.2  MCV 100.7* 102.5* 103.2*  MCH 35.1* 34.4* 34.5*  MCHC 34.9 33.5 33.4  RDW 12.8 13.0 12.8  PLT 180 253 210    Cardiac EnzymesNo results for input(s): TROPONINI in the last 168 hours. No results for input(s): TROPIPOC in the last 168 hours.   BNP Recent Labs  Lab 10/10/20 1547  BNP 973.5*     DDimer No results for input(s): DDIMER in the last 168 hours.   Radiology    DG Chest Portable 1 View  Result Date: 10/10/2020 IMPRESSION: 1. Cardiomegaly with vascular congestion. 2. Increased airspace disease at both bases with probable right pleural effusion. Electronically Signed   By: 10/12/2020 M.D.   On: 10/10/2020 16:08    Cardiac Studies   2D echo 06/08/2020: 1. Left ventricular ejection fraction, by estimation, is 20 to 25%. The  left ventricle has severely decreased function. The left ventricle    demonstrates global hypokinesis. Left ventricular diastolic function could  not be evaluated.  2. Right ventricular systolic function is normal. The right ventricular  size is mildly enlarged.  3. The mitral valve was not well visualized. No evidence of mitral valve  regurgitation.  4. The aortic valve was not well visualized. Aortic valve regurgitation  not well assessed.  5. The inferior vena cava is dilated in size with >50% respiratory  variability, suggesting right atrial pressure of 8 mmHg.  __________  [facility] 06/10/2020: Conclusions: 1. Mild to moderate, nonobstructive coronary artery disease with up to 50% stenoses involving small D1 branch as well as mid RCA. Systolic dysfunction is out of proportion to CAD, consistent with nonischemic cardiomyopathy. 2. Mildly elevated left and right heart filling pressures. 3. Low normal to mildly reduced Fick cardiac output.  Recommendations: 1. Optimize goal-directed medical therapy for management of nonischemic cardiomyopathy. Smoking, tobacco, and drug cessation encouraged, as cardiomyopathy certainly could be related to substance abuse. 2. Medical therapy and risk factor modification to prevent progression of coronary artery disease. __________  2D echo 10/06/2020: 1. Left ventricular ejection fraction, by estimation, is 20 to 25%. The  left ventricle has severely decreased function. The left ventricle has no  regional wall motion abnormalities. The left ventricular internal cavity  size was moderately dilated. Left  ventricular diastolic parameters were normal.  2. Right ventricular systolic function is moderately reduced. The right  ventricular size is normal. Tricuspid regurgitation signal is inadequate  for assessing PA pressure.  3. Mild mitral valve regurgitation.  4. The inferior vena cava is dilated in size with >50% respiratory  variability, suggesting right atrial pressure of 8 mmHg.  Patient Profile     64  y.o. male with history of BiV failure secondary to nonischemic cardiomyopathy, polysubstance use including cocaine, alcohol, and tobacco use, COPD, chronic low back pain on narcotic pain medication, HTN, and family history of CAD with his brother dying from an MI in his 24s and mother passing in her 57s from possible heart failure  who is being seen today for the evaluation of acute on chronic HFrEF at the request of Dr. 49s.  Assessment & Plan    1. Acute  on chronic hypoxic respiratory failure:  -Multifactorial including acute on chronic HFrEF with RV dysfunction and acute COPD exacerbation in the context of nonadherence with medical therapy  -Supplemental oxygen  2. Acute on chronic HFrEF with RV dysfunction secondary to NICM:  -He continues to appear volume overloaded -Continue IV Lasix 40 mg twice daily, until there is evidence of intravascular volume depletion  -Continue carvedilol -Resume losartan with normalization of potassium  -Escalate GDMT with resumption of ARB and potential addition of MRA prior to discharge or if needed in the outpatient setting -Could consider transition to [facility] in outpatient follow-up -Daily weights -Strict I's and O's -Suspect I/O are not accurate  -Place ReDs vest  -If he demonstrates adherence to medical regimen and outpatient follow-up and in this setting is medically optimized with GDMT, would recommend repeating limited echo in several months time to evaluate for improvement in LV systolic function.  If at that time, despite medical compliance and maximally tolerated evidence-based medical therapy, his EF remains less than 35% would recommend referral to EP for consideration of ICD  3. Elevated troponin with nonobstructive CAD:  -Minimally elevated and nontrending not consistent with ACS  -EKG nonacute  -Recent diagnostic cath demonstrated nonobstructive disease in 05/2020 as outlined above  -Continue current medical management including aspirin,  carvedilol -No indication for heparin drip or ischemic testing in the inpatient setting at this time  4. Polysubstance use:  -Repeat urine drug screen  -Complete cessation is recommended   5. COPD exacerbation:  -Management per internal medicine  -Recommend tapering steroid use when possible  6.  Hyperkalemia: -Resolved -No longer receiving LR, was ordered at time of admission  -Off KCl -Currently no indication for Kayexalate  7.  HLD: -LDL of 73 on 10/06/2020 with goal being less than 70 -Lipitor   8.  Palpitations/atrial tachycardia: -Continue carvedilol as above -Lytes at goal -UDS remains pending, please complete  -TSH normal  9. Dysphagia: -Per IM  For questions or updates, please contact [facility] Please consult [email or web address] for contact info under Cardiology/STEMI.    Signed, Eula Listen, PA-C [facility] Pager: [phone] 10/13/2020, 7:30 AM

## 2020-10-13 NOTE — Progress Notes (Signed)
PROGRESS NOTE    Kurt Blankenship  KKX:[id number] DOB: 01/08/1956 DOA: 10/10/2020 PCP: System, Provider Not In    Brief Narrative:  Kurt Blankenship is a 64 y.o. male with medical history significant for chronic systolic heart failure with most recent echocardiogram in October 2021 showing LVEF 20 to 25%, hypertension, COPD, chronic tobacco abuse, chronic pain syndrome, who is admitted to [facility] on 10/10/2020 with acute hypoxic respiratory failure in the setting of acute on chronic systolic heart failure as well as acute COPD exacerbation after presenting from home to [facility] Emergency Department complaining of shortness of breath.   10/27- pt has decided to go home with hospice once stable. 10/28- no overnight issues  Consultants:   Cardiology, palliative care  Procedures:   Antimicrobials:       Subjective: Starting to feel his stomach distention is improving and less short of breath.  Objective: Vitals:   10/12/20 2236 10/13/20 0239 10/13/20 0514 10/13/20 0839  BP: (!) 111/100 117/86 108/76 107/82  Pulse: 90 93 83 64  Resp: (!) 24 20 20    Temp: 98.2 F (36.8 C) 98.1 F (36.7 C) 97.8 F (36.6 C) 97.8 F (36.6 C)  TempSrc:    Oral  SpO2: 96% 91% (!) 89% 94%  Weight:   85.5 kg   Height:        Intake/Output Summary (Last 24 hours) at 10/13/2020 0847 Last data filed at 10/13/2020 0514 Gross per 24 hour  Intake 1320 ml  Output 1750 ml  Net -430 ml   Filed Weights   10/11/20 0434 10/12/20 0500 10/13/20 0514  Weight: 88.2 kg 85.4 kg 85.5 kg    Examination: Lying in bed, NAD Positive fine crackles bilaterally no wheezing RRR S1-S2 no murmurs, +JVD Soft less distention, but still +, ttp with deep palpation 2+ pitting edema b/l aaxox3  Mood and affect appropriate in current setting    Data Reviewed: I have personally reviewed following labs and imaging studies  CBC: Recent Labs  Lab 10/08/20 0411 10/10/20 1547  10/11/20 0511  WBC 6.3 10.1 8.3  NEUTROABS  --  7.2  --   HGB 14.2 16.4 15.1  HCT 40.7 48.9 45.2  MCV 100.7* 102.5* 103.2*  PLT 180 253 210   Basic Metabolic Panel: Recent Labs  Lab 10/08/20 0411 10/10/20 1547 10/11/20 0511 10/12/20 0900 10/13/20 0609  NA 132* 135 131* 132* 136  K 4.2 3.7 5.3* 5.6* 4.7  CL 94* 93* 92* 90* 91*  CO2 29 33* 31 33* 36*  GLUCOSE 194* 159* 217* 189* 86  BUN 27* 18 18 26* 27*  CREATININE 0.71 0.65 0.67 0.94 0.91  CALCIUM 9.0 9.4 9.1 9.8 9.7  MG  --  2.1 2.0  --   --   PHOS  --   --  4.0  --   --    GFR: Estimated Creatinine Clearance: 90 mL/min (by C-G formula based on SCr of 0.91 mg/dL). Liver Function Tests: Recent Labs  Lab 10/10/20 1547 10/11/20 0511  AST 34 36  ALT 68* 62*  ALKPHOS 71 64  BILITOT 1.4* 0.9  PROT 7.1 5.9*  ALBUMIN 4.1 3.5   No results for input(s): LIPASE, AMYLASE in the last 168 hours. No results for input(s): AMMONIA in the last 168 hours. Coagulation Profile: Recent Labs  Lab 10/11/20 0511  INR 1.1   Cardiac Enzymes: No results for input(s): CKTOTAL, CKMB, CKMBINDEX, TROPONINI in the last 168 hours. BNP (last 3 results) No  results for input(s): PROBNP in the last 8760 hours. HbA1C: No results for input(s): HGBA1C in the last 72 hours. CBG: Recent Labs  Lab 10/07/20 1115 10/07/20 1626  GLUCAP 84 181*   Lipid Profile: No results for input(s): CHOL, HDL, LDLCALC, TRIG, CHOLHDL, LDLDIRECT in the last 72 hours. Thyroid Function Tests: Recent Labs    10/11/20 0511  TSH 0.484   Anemia Panel: No results for input(s): VITAMINB12, FOLATE, FERRITIN, TIBC, IRON, RETICCTPCT in the last 72 hours. Sepsis Labs: Recent Labs  Lab 10/11/20 0511  PROCALCITON <0.10    Recent Results (from the past 240 hour(s))  Respiratory Panel by RT PCR (Flu A&B, Covid) - Nasopharyngeal Swab     Status: None   Collection Time: 10/05/20  1:25 PM   Specimen: Nasopharyngeal Swab  Result Value Ref Range Status   SARS  Coronavirus 2 by RT PCR NEGATIVE NEGATIVE Final    Comment: (NOTE) SARS-CoV-2 target nucleic acids are NOT DETECTED.  The SARS-CoV-2 RNA is generally detectable in upper respiratoy specimens during the acute phase of infection. The lowest concentration of SARS-CoV-2 viral copies this assay can detect is 131 copies/mL. A negative result does not preclude SARS-Cov-2 infection and should not be used as the sole basis for treatment or other patient management decisions. A negative result may occur with  improper specimen collection/handling, submission of specimen other than nasopharyngeal swab, presence of viral mutation(s) within the areas targeted by this assay, and inadequate number of viral copies (<131 copies/mL). A negative result must be combined with clinical observations, patient history, and epidemiological information. The expected result is Negative.  Fact Sheet for Patients:  https://www.moore.com/  Fact Sheet for Healthcare Providers:  https://www.young.biz/  This test is no t yet approved or cleared by the [place] FDA and  has been authorized for detection and/or diagnosis of SARS-CoV-2 by FDA under an Emergency Use Authorization (EUA). This EUA will remain  in effect (meaning this test can be used) for the duration of the COVID-19 declaration under Section 564(b)(1) of the Act, 21 U.S.C. section 360bbb-3(b)(1), unless the authorization is terminated or revoked sooner.     Influenza A by PCR NEGATIVE NEGATIVE Final   Influenza B by PCR NEGATIVE NEGATIVE Final    Comment: (NOTE) The Xpert Xpress SARS-CoV-2/FLU/RSV assay is intended as an aid in  the diagnosis of influenza from Nasopharyngeal swab specimens and  should not be used as a sole basis for treatment. Nasal washings and  aspirates are unacceptable for Xpert Xpress SARS-CoV-2/FLU/RSV  testing.  Fact Sheet for  Patients: https://www.moore.com/  Fact Sheet for Healthcare Providers: https://www.young.biz/  This test is not yet approved or cleared by the [place] FDA and  has been authorized for detection and/or diagnosis of SARS-CoV-2 by  FDA under an Emergency Use Authorization (EUA). This EUA will remain  in effect (meaning this test can be used) for the duration of the  Covid-19 declaration under Section 564(b)(1) of the Act, 21  U.S.C. section 360bbb-3(b)(1), unless the authorization is  terminated or revoked. Performed at [facility], [address] [place], [place] [address]   Respiratory Panel by RT PCR (Flu A&B, Covid) - Nasopharyngeal Swab     Status: None   Collection Time: 10/10/20  4:24 PM   Specimen: Nasopharyngeal Swab  Result Value Ref Range Status   SARS Coronavirus 2 by RT PCR NEGATIVE NEGATIVE Final    Comment: (NOTE) SARS-CoV-2 target nucleic acids are NOT DETECTED.  The SARS-CoV-2 RNA is generally  detectable in upper respiratoy specimens during the acute phase of infection. The lowest concentration of SARS-CoV-2 viral copies this assay can detect is 131 copies/mL. A negative result does not preclude SARS-Cov-2 infection and should not be used as the sole basis for treatment or other patient management decisions. A negative result may occur with  improper specimen collection/handling, submission of specimen other than nasopharyngeal swab, presence of viral mutation(s) within the areas targeted by this assay, and inadequate number of viral copies (<131 copies/mL). A negative result must be combined with clinical observations, patient history, and epidemiological information. The expected result is Negative.  Fact Sheet for Patients:  https://www.moore.com/  Fact Sheet for Healthcare Providers:  https://www.young.biz/  This test is no t yet approved or cleared by the [place] FDA and  has been authorized for detection and/or diagnosis of SARS-CoV-2 by FDA under an Emergency Use Authorization (EUA). This EUA will remain  in effect (meaning this test can be used) for the duration of the COVID-19 declaration under Section 564(b)(1) of the Act, 21 U.S.C. section 360bbb-3(b)(1), unless the authorization is terminated or revoked sooner.     Influenza A by PCR NEGATIVE NEGATIVE Final   Influenza B by PCR NEGATIVE NEGATIVE Final    Comment: (NOTE) The Xpert Xpress SARS-CoV-2/FLU/RSV assay is intended as an aid in  the diagnosis of influenza from Nasopharyngeal swab specimens and  should not be used as a sole basis for treatment. Nasal washings and  aspirates are unacceptable for Xpert Xpress SARS-CoV-2/FLU/RSV  testing.  Fact Sheet for Patients: https://www.moore.com/  Fact Sheet for Healthcare Providers: https://www.young.biz/  This test is not yet approved or cleared by the [place] FDA and  has been authorized for detection and/or diagnosis of SARS-CoV-2 by  FDA under an Emergency Use Authorization (EUA). This EUA will remain  in effect (meaning this test can be used) for the duration of the  Covid-19 declaration under Section 564(b)(1) of the Act, 21  U.S.C. section 360bbb-3(b)(1), unless the authorization is  terminated or revoked. Performed at [facility], [address], [place], [place] [address]          Radiology Studies: No results found.      Scheduled Meds: . aspirin EC  81 mg Oral Daily  . atorvastatin  20 mg Oral QHS  . carvedilol  3.125 mg Oral BID WC  . enoxaparin (LOVENOX) injection  40 mg Subcutaneous Q24H  . furosemide  40 mg Intravenous BID  . gabapentin  300 mg Oral BID  . Ipratropium-Albuterol  1 puff Inhalation Q6H WA  . losartan  12.5 mg Oral Daily  . nicotine  21 mg Transdermal Daily   Continuous Infusions:  Assessment & Plan:   Principal Problem:    Acute on chronic systolic CHF (congestive heart failure) (HCC) Active Problems:   Hypertension   COPD exacerbation (HCC)   HLD (hyperlipidemia)   Tobacco abuse   SOB (shortness of breath)   Acute respiratory failure with hypoxia (HCC)   Acute on chronic respiratory failure with hypoxia and hypercapnia (HCC)   Acute on chronic systolic CHF (congestive heart failure) (HCC): -2D echo on 6/20//21 and 10/06/20 showed EF of 20-25%.  10/28-slowly improving but still volume overloaded Trying to optimize prior to discharge with hospice to prevent readmission Cardiology following We will continue Lasix IV twice daily I's and O's Daily weight Continue Coreg, losartan was on hold due to soft blood pressure elevated K. Likely cannot go back on losartan as potassium  kept elevating and improved with Kayexalate   COPD exacerbation (HCC): Clinically improved IV steroids was discontinued Continue inhalers Has chronic oxygen at home but is noncompliant   Elevated troponin:Troponin 41-->41.  Likely due to demand ischemia No chest pain  Continue aspirin, Lipitor, Coreg     Hyperkalemia- K was 5.6, given dose of Kayexalate, this a.m. is 4.7 continue to monitor    HTN (hypertension) -Stable and on low side since receiving IV Lasix  Hold losartan  Continue Coreg    HLD (hyperlipidemia):  Was on statin therapy in the past but was not taking it Continue statin  h/o Tobacco abuse and Alcohol abuse: -Was counseled on quitting  Continue nicotine patch  -watch for withdrawal symptoms from alcohol  Hyperglycemia suspect steroid induced -A1c 5.6 10/27 has improved  palliative care consultation. home with hospice   DVT prophylaxis: Lovenox  code Status: DNR Family Communication: None at bedside   Status is: Inpatient  Remains inpatient appropriate because:IV treatments appropriate due to intensity of illness or inability to take PO   Dispo: The patient is from:  Home              Anticipated d/c is to: Home              Anticipated d/c date is: 1-2 days              Patient currently is not medically stable to d/c.needs iv lasix .            LOS: 3 days   Time spent: 45 min with >50% on coc    Lynn Ito, MD [facility] Pager [phone]  If 7PM-7AM, please contact night-coverage [email or web address] Password [facility] 10/13/2020, 8:47 AM

## 2020-10-13 NOTE — Care Management Important Message (Signed)
Important Message  Patient Details  Name: Kurt Blankenship MRN: [id number] Date of Birth: 1956-06-21   Medicare Important Message Given:  Yes     Johnell Comings 10/13/2020, 1:31 PM

## 2020-10-13 NOTE — Progress Notes (Signed)
Education provided on the fall prevention and safety in the hospital. Writer placed bed alarm when getting report after hearing pt state, " Why am I getting dizzy every time I get up." Explained to pt that he was getting a medication called lasix to help get the fluid out of your body so you can breath better and it can lower your blood pressure and make you dizzy if pressure is low. Pt nodded and verbalized understanding, but informed writer he wanted the alarm off. He stated, " I do not need this alarm on, I can get up on my own with no problem." Education reinforced again about safety and fall prevention especially since pt gets dizzy when getting up. Pt continues to insist he doesn't need an alarm on. Will continue to monitor. Alarm set off.

## 2020-10-13 NOTE — Plan of Care (Signed)
Pt Kurt Blankenship. Calm and cooperative and able to voice his needs. Pt wears oxygen at will. O2 sat fluctuates from 85% and up when on RA. Pt remains SR-ST low 100's on the monitor. Pt able to ambulate to restroom to void. Pt takes pills whole and tolerates diet. Pt taking pain pills for prn chronic pain of the neck, back and abdomen. But  pt states abdomen feels much better after he had a BM. Plan is to discharge pt on hospice. Safety measures in place. Will continue to monitor. Problem: Education: Goal: Knowledge of General Education information will improve Description: Including pain rating scale, medication(s)/side effects and non-pharmacologic comfort measures Outcome: Progressing   Problem: Health Behavior/Discharge Planning: Goal: Ability to manage health-related needs will improve Outcome: Progressing   Problem: Clinical Measurements: Goal: Ability to maintain clinical measurements within normal limits will improve Outcome: Progressing Goal: Will remain free from infection Outcome: Progressing Goal: Diagnostic test results will improve Outcome: Progressing Goal: Respiratory complications will improve Outcome: Progressing Goal: Cardiovascular complication will be avoided Outcome: Progressing   Problem: Activity: Goal: Risk for activity intolerance will decrease Outcome: Progressing   Problem: Nutrition: Goal: Adequate nutrition will be maintained Outcome: Progressing   Problem: Coping: Goal: Level of anxiety will decrease Outcome: Progressing   Problem: Elimination: Goal: Will not experience complications related to bowel motility Outcome: Progressing Goal: Will not experience complications related to urinary retention Outcome: Progressing   Problem: Pain Managment: Goal: General experience of comfort will improve Outcome: Progressing   Problem: Safety: Goal: Ability to remain free from injury will improve Outcome: Progressing   Problem: Skin Integrity: Goal: Risk for  impaired skin integrity will decrease Outcome: Progressing   Problem: Education: Goal: Knowledge of disease or condition will improve Outcome: Progressing Goal: Knowledge of the prescribed therapeutic regimen will improve Outcome: Progressing Goal: Individualized Educational Video(s) Outcome: Progressing   Problem: Activity: Goal: Ability to tolerate increased activity will improve Outcome: Progressing Goal: Will verbalize the importance of balancing activity with adequate rest periods Outcome: Progressing   Problem: Respiratory: Goal: Ability to maintain a clear airway will improve Outcome: Progressing Goal: Levels of oxygenation will improve Outcome: Progressing Goal: Ability to maintain adequate ventilation will improve Outcome: Progressing

## 2020-10-14 ENCOUNTER — Ambulatory Visit: Payer: Medicare HMO | Admitting: Family

## 2020-10-14 DIAGNOSIS — I5023 Acute on chronic systolic (congestive) heart failure: Secondary | ICD-10-CM | POA: Diagnosis not present

## 2020-10-14 LAB — BASIC METABOLIC PANEL
Anion gap: 11 (ref 5–15)
BUN: 27 mg/dL — ABNORMAL HIGH (ref 8–23)
CO2: 37 mmol/L — ABNORMAL HIGH (ref 22–32)
Calcium: 9.3 mg/dL (ref 8.9–10.3)
Chloride: 88 mmol/L — ABNORMAL LOW (ref 98–111)
Creatinine, Ser: 0.98 mg/dL (ref 0.61–1.24)
GFR, Estimated: >60 mL/min (ref >60)
Glucose, Bld: 114 mg/dL — ABNORMAL HIGH (ref 70–99)
Potassium: 3.9 mmol/L (ref 3.5–5.1)
Sodium: 136 mmol/L (ref 135–145)

## 2020-10-14 MED ORDER — LOSARTAN POTASSIUM 25 MG PO TABS
25.0000 mg | ORAL_TABLET | Freq: Every day | ORAL | Status: DC
  Administered 2020-10-15 – 2020-10-17 (×3): 25 mg via ORAL
  Filled 2020-10-14 (×3): qty 1

## 2020-10-14 NOTE — Progress Notes (Signed)
PROGRESS NOTE    Kurt Blankenship  WIO:[id number] DOB: 04-May-1956 DOA: 10/10/2020 PCP: System, Provider Not In    Brief Narrative:  Kurt Blankenship is a 64 y.o. male with medical history significant for chronic systolic heart failure with most recent echocardiogram in October 2021 showing LVEF 20 to 25%, hypertension, COPD, chronic tobacco abuse, chronic pain syndrome, who is admitted to [facility] on 10/10/2020 with acute hypoxic respiratory failure in the setting of acute on chronic systolic heart failure as well as acute COPD exacerbation after presenting from home to [facility] Emergency Department complaining of shortness of breath.   10/27- pt has decided to go home with hospice once stable. 10/29- no over night issues. Weight down Good UO     Consultants:   Cardiology, palliative care  Procedures:   Antimicrobials:       Subjective: States can tell more his abdomen is less distended. Good urine output. He feels less fluid overloaded. Shortness of breath is improving.  Objective: Vitals:   10/14/20 0445 10/14/20 0500 10/14/20 0508 10/14/20 0620  BP: 90/79  126/81 108/90  Pulse: 77   84  Resp: 16     Temp: 98.2 F (36.8 C)     TempSrc: Oral     SpO2: 97%     Weight:  84.1 kg    Height:        Intake/Output Summary (Last 24 hours) at 10/14/2020 0830 Last data filed at 10/14/2020 0500 Gross per 24 hour  Intake 440 ml  Output 4050 ml  Net -3610 ml   Filed Weights   10/12/20 0500 10/13/20 0514 10/14/20 0500  Weight: 85.4 kg 85.5 kg 84.1 kg    Examination: Calm, comfortable on nasal cannula Positive fine crackles bilaterally no wheeze, +JVD Regular S1-S2 no murmurs Abdomen soft less distended positive bowel sounds 2+ pitting edema bilaterally Alert oriented x3 Mood and affect appropriate for current setting    Data Reviewed: I have personally reviewed following labs and imaging studies  CBC: Recent Labs  Lab  10/08/20 0411 10/10/20 1547 10/11/20 0511  WBC 6.3 10.1 8.3  NEUTROABS  --  7.2  --   HGB 14.2 16.4 15.1  HCT 40.7 48.9 45.2  MCV 100.7* 102.5* 103.2*  PLT 180 253 210   Basic Metabolic Panel: Recent Labs  Lab 10/10/20 1547 10/11/20 0511 10/12/20 0900 10/13/20 0609 10/14/20 0547  NA 135 131* 132* 136 136  K 3.7 5.3* 5.6* 4.7 3.9  CL 93* 92* 90* 91* 88*  CO2 33* 31 33* 36* 37*  GLUCOSE 159* 217* 189* 86 114*  BUN 18 18 26* 27* 27*  CREATININE 0.65 0.67 0.94 0.91 0.98  CALCIUM 9.4 9.1 9.8 9.7 9.3  MG 2.1 2.0  --   --   --   PHOS  --  4.0  --   --   --    GFR: Estimated Creatinine Clearance: 77.2 mL/min (by C-G formula based on SCr of 0.98 mg/dL). Liver Function Tests: Recent Labs  Lab 10/10/20 1547 10/11/20 0511  AST 34 36  ALT 68* 62*  ALKPHOS 71 64  BILITOT 1.4* 0.9  PROT 7.1 5.9*  ALBUMIN 4.1 3.5   No results for input(s): LIPASE, AMYLASE in the last 168 hours. No results for input(s): AMMONIA in the last 168 hours. Coagulation Profile: Recent Labs  Lab 10/11/20 0511  INR 1.1   Cardiac Enzymes: No results for input(s): CKTOTAL, CKMB, CKMBINDEX, TROPONINI in the last 168 hours. BNP (  last 3 results) No results for input(s): PROBNP in the last 8760 hours. HbA1C: No results for input(s): HGBA1C in the last 72 hours. CBG: Recent Labs  Lab 10/07/20 1115 10/07/20 1626  GLUCAP 84 181*   Lipid Profile: No results for input(s): CHOL, HDL, LDLCALC, TRIG, CHOLHDL, LDLDIRECT in the last 72 hours. Thyroid Function Tests: No results for input(s): TSH, T4TOTAL, FREET4, T3FREE, THYROIDAB in the last 72 hours. Anemia Panel: No results for input(s): VITAMINB12, FOLATE, FERRITIN, TIBC, IRON, RETICCTPCT in the last 72 hours. Sepsis Labs: Recent Labs  Lab 10/11/20 0511  PROCALCITON <0.10    Recent Results (from the past 240 hour(s))  Respiratory Panel by RT PCR (Flu A&B, Covid) - Nasopharyngeal Swab     Status: None   Collection Time: 10/05/20  1:25 PM    Specimen: Nasopharyngeal Swab  Result Value Ref Range Status   SARS Coronavirus 2 by RT PCR NEGATIVE NEGATIVE Final    Comment: (NOTE) SARS-CoV-2 target nucleic acids are NOT DETECTED.  The SARS-CoV-2 RNA is generally detectable in upper respiratoy specimens during the acute phase of infection. The lowest concentration of SARS-CoV-2 viral copies this assay can detect is 131 copies/mL. A negative result does not preclude SARS-Cov-2 infection and should not be used as the sole basis for treatment or other patient management decisions. A negative result may occur with  improper specimen collection/handling, submission of specimen other than nasopharyngeal swab, presence of viral mutation(s) within the areas targeted by this assay, and inadequate number of viral copies (<131 copies/mL). A negative result must be combined with clinical observations, patient history, and epidemiological information. The expected result is Negative.  Fact Sheet for Patients:  https://www.moore.com/  Fact Sheet for Healthcare Providers:  https://www.young.biz/  This test is no t yet approved or cleared by the [place] FDA and  has been authorized for detection and/or diagnosis of SARS-CoV-2 by FDA under an Emergency Use Authorization (EUA). This EUA will remain  in effect (meaning this test can be used) for the duration of the COVID-19 declaration under Section 564(b)(1) of the Act, 21 U.S.C. section 360bbb-3(b)(1), unless the authorization is terminated or revoked sooner.     Influenza A by PCR NEGATIVE NEGATIVE Final   Influenza B by PCR NEGATIVE NEGATIVE Final    Comment: (NOTE) The Xpert Xpress SARS-CoV-2/FLU/RSV assay is intended as an aid in  the diagnosis of influenza from Nasopharyngeal swab specimens and  should not be used as a sole basis for treatment. Nasal washings and  aspirates are unacceptable for Xpert Xpress SARS-CoV-2/FLU/RSV   testing.  Fact Sheet for Patients: https://www.moore.com/  Fact Sheet for Healthcare Providers: https://www.young.biz/  This test is not yet approved or cleared by the [place] FDA and  has been authorized for detection and/or diagnosis of SARS-CoV-2 by  FDA under an Emergency Use Authorization (EUA). This EUA will remain  in effect (meaning this test can be used) for the duration of the  Covid-19 declaration under Section 564(b)(1) of the Act, 21  U.S.C. section 360bbb-3(b)(1), unless the authorization is  terminated or revoked. Performed at [facility], [address] [place], [place] [address]   Respiratory Panel by RT PCR (Flu A&B, Covid) - Nasopharyngeal Swab     Status: None   Collection Time: 10/10/20  4:24 PM   Specimen: Nasopharyngeal Swab  Result Value Ref Range Status   SARS Coronavirus 2 by RT PCR NEGATIVE NEGATIVE Final    Comment: (NOTE) SARS-CoV-2 target nucleic acids are NOT DETECTED.  The SARS-CoV-2 RNA is generally detectable in upper respiratoy specimens during the acute phase of infection. The lowest concentration of SARS-CoV-2 viral copies this assay can detect is 131 copies/mL. A negative result does not preclude SARS-Cov-2 infection and should not be used as the sole basis for treatment or other patient management decisions. A negative result may occur with  improper specimen collection/handling, submission of specimen other than nasopharyngeal swab, presence of viral mutation(s) within the areas targeted by this assay, and inadequate number of viral copies (<131 copies/mL). A negative result must be combined with clinical observations, patient history, and epidemiological information. The expected result is Negative.  Fact Sheet for Patients:  https://www.moore.com/  Fact Sheet for Healthcare Providers:  https://www.young.biz/  This test is no t yet  approved or cleared by the [place] FDA and  has been authorized for detection and/or diagnosis of SARS-CoV-2 by FDA under an Emergency Use Authorization (EUA). This EUA will remain  in effect (meaning this test can be used) for the duration of the COVID-19 declaration under Section 564(b)(1) of the Act, 21 U.S.C. section 360bbb-3(b)(1), unless the authorization is terminated or revoked sooner.     Influenza A by PCR NEGATIVE NEGATIVE Final   Influenza B by PCR NEGATIVE NEGATIVE Final    Comment: (NOTE) The Xpert Xpress SARS-CoV-2/FLU/RSV assay is intended as an aid in  the diagnosis of influenza from Nasopharyngeal swab specimens and  should not be used as a sole basis for treatment. Nasal washings and  aspirates are unacceptable for Xpert Xpress SARS-CoV-2/FLU/RSV  testing.  Fact Sheet for Patients: https://www.moore.com/  Fact Sheet for Healthcare Providers: https://www.young.biz/  This test is not yet approved or cleared by the [place] FDA and  has been authorized for detection and/or diagnosis of SARS-CoV-2 by  FDA under an Emergency Use Authorization (EUA). This EUA will remain  in effect (meaning this test can be used) for the duration of the  Covid-19 declaration under Section 564(b)(1) of the Act, 21  U.S.C. section 360bbb-3(b)(1), unless the authorization is  terminated or revoked. Performed at [facility], [address] [place], [place] [address]          Radiology Studies: No results found.      Scheduled Meds: . aspirin EC  81 mg Oral Daily  . atorvastatin  20 mg Oral QHS  . carvedilol  3.125 mg Oral BID WC  . enoxaparin (LOVENOX) injection  40 mg Subcutaneous Q24H  . furosemide  40 mg Intravenous Q8H  . gabapentin  300 mg Oral BID  . Ipratropium-Albuterol  1 puff Inhalation Q6H WA  . losartan  12.5 mg Oral Daily  . nicotine  21 mg Transdermal Daily   Continuous  Infusions:  Assessment & Plan:   Principal Problem:   Acute on chronic systolic CHF (congestive heart failure) (HCC) Active Problems:   Hypertension   COPD exacerbation (HCC)   HLD (hyperlipidemia)   Tobacco abuse   SOB (shortness of breath)   Acute respiratory failure with hypoxia (HCC)   Acute on chronic respiratory failure with hypoxia and hypercapnia (HCC)   Acute on chronic systolic CHF (congestive heart failure) (HCC): -2D echo on 6/20//21 and 10/06/20 showed EF of 20-25%.  10/29-improving clinically however still volume overloaded. Would like to optimize clinical status before discharging him home to prevent readmission.  Renal function is stable. We will continue Lasix IV 3 times daily I's and O's Daily weight Losartan low-dose was added urology but will need to monitor  as his potassium levels were elevated previously which improved with Kayexalate Continue coreg   COPD exacerbation (HCC): Improved. No further exacerbation Iv steroids was d/c'd On home oxygen chronically but reports noncompliance   Elevated troponin:Troponin 41-->41.  Due to demand ischemia, no chest pain Continue aspirin, statin, beta-blockers    Hyperkalemia- K was 5.6, given dose of Kayexalate, with improvement K is 3.9 today Need to monitor closely    HTN (hypertension) Continue with Coreg. Low-dose losartan was added. Receiving IV Lasix.   HLD (hyperlipidemia):  Continue statin h/o Tobacco abuse and Alcohol abuse: -Was counseled on quitting  Continue nicotine patch  -watch for withdrawal symptoms from alcohol  Hyperglycemia suspect steroid induced -A1c 5.6 10/27 has improved  palliative care consultation. home with hospice   DVT prophylaxis: Lovenox  code Status: DNR Family Communication: None at bedside   Status is: Inpatient  Remains inpatient appropriate because:IV treatments appropriate due to intensity of illness or inability to take PO   Dispo:  The patient is from: Home              Anticipated d/c is to: Home              Anticipated d/c date is: 1-2 days              Patient currently is not medically stable to d/c.needs iv lasix . Need to optimize medical therapy prior to discharge to prevent readmission            LOS: 4 days   Time spent: 35 min with >50% on coc    Lynn Ito, MD [facility] Pager [phone]  If 7PM-7AM, please contact night-coverage [email or web address] Password [email or web address] 10/14/2020, 8:30 AM

## 2020-10-14 NOTE — Progress Notes (Signed)
[facility] hospital liaison note:  Follow up visit made to new referral for [facility] hospice services at home. Patient seen sitting up in the chair, oxygen in place, alert and interactive. He confirmed that all DME has been delivered and plan is for discharge home tomorrow via [facility]. TOC [address] updated.  Patient will be discharging to his cousin Bobby's house, contact number correct on Epic face sheet. Please send signed out of facility DNR home with patient. Referral updated to planned discharge. Thank you.  Dayna Barker BSN, RN, [facility] [employer] [phone]

## 2020-10-14 NOTE — Progress Notes (Signed)
Patient has removed all of his cardiac monitoring leads and is taping them together along with taking off his pulse ox and oxygen.  Patient has been educated and continuing to encourage to leave on the cardiac monitoring, pulse ox, and oxygen.

## 2020-10-14 NOTE — Progress Notes (Signed)
Progress Note  Patient Name: Kurt Blankenship Date of Encounter: 10/14/2020  Primary Cardiologist: Yvonne Kendall, MD   Subjective   States he is ready to go home.  Denies any shortness of breath, though still on nasal cannula oxygen.  He reports that he only uses as needed oxygen at home.  He denies any orthopnea.  He reports improved lower extremity edema.  He denies any chest pain, racing heart rate, or palpitations.  Inpatient Medications    Scheduled Meds: . aspirin EC  81 mg Oral Daily  . atorvastatin  20 mg Oral QHS  . carvedilol  3.125 mg Oral BID WC  . enoxaparin (LOVENOX) injection  40 mg Subcutaneous Q24H  . furosemide  40 mg Intravenous Q8H  . gabapentin  300 mg Oral BID  . Ipratropium-Albuterol  1 puff Inhalation Q6H WA  . losartan  12.5 mg Oral Daily  . nicotine  21 mg Transdermal Daily   Continuous Infusions:  PRN Meds: acetaminophen **OR** acetaminophen, albuterol, ondansetron **OR** ondansetron (ZOFRAN) IV, oxyCODONE, traZODone   Vital Signs    Vitals:   10/14/20 0508 10/14/20 0620 10/14/20 0935 10/14/20 1242  BP: 126/81 108/90 133/81 139/73  Pulse:  84 82 87  Resp:    20  Temp:   (!) 97.4 F (36.3 C) 97.6 F (36.4 C)  TempSrc:   Oral Oral  SpO2:   96% 90%  Weight:      Height:        Intake/Output Summary (Last 24 hours) at 10/14/2020 1517 Last data filed at 10/14/2020 1500 Gross per 24 hour  Intake 680 ml  Output 3900 ml  Net -3220 ml   Last 3 Weights 10/14/2020 10/13/2020 10/12/2020  Weight (lbs) 185 lb 6.4 oz 188 lb 9.6 oz 188 lb 4.8 oz  Weight (kg) 84.097 kg 85.548 kg 85.412 kg      Telemetry    NSR, rate 70s to 90s.  PVCs, bigeminy, ventricular couplets, ventricular triplets, NSVT- Personally Reviewed  ECG    No new tracings- Personally Reviewed  Physical Exam   GEN: No acute distress.   Neck:  JVP ~9-10cm Cardiac: RRR, no murmurs, rubs, or gallops.  Respiratory:  Coarse breath sounds bilaterally, decreased breath  sounds at right base, bilateral wheezing. GI: Soft, nontender, non-distended  MS:  1-2+ bilateral lower extremity edema; No deformity. Neuro:  Nonfocal  Psych: Normal affect   Labs    High Sensitivity Troponin:   Recent Labs  Lab 10/05/20 1625 10/05/20 1933 10/06/20 0409 10/10/20 1547 10/10/20 1817  TROPONINIHS 49* 30* 40* 41* 41*      Cardiac EnzymesNo results for input(s): TROPONINI in the last 168 hours. No results for input(s): TROPIPOC in the last 168 hours.   Chemistry Recent Labs  Lab 10/10/20 1547 10/10/20 1547 10/11/20 0511 10/11/20 0511 10/12/20 0900 10/13/20 0609 10/14/20 0547  NA 135   < > 131*   < > 132* 136 136  K 3.7   < > 5.3*   < > 5.6* 4.7 3.9  CL 93*   < > 92*   < > 90* 91* 88*  CO2 33*   < > 31   < > 33* 36* 37*  GLUCOSE 159*   < > 217*   < > 189* 86 114*  BUN 18   < > 18   < > 26* 27* 27*  CREATININE 0.65   < > 0.67   < > 0.94 0.91 0.98  CALCIUM 9.4   < >  9.1   < > 9.8 9.7 9.3  PROT 7.1  --  5.9*  --   --   --   --   ALBUMIN 4.1  --  3.5  --   --   --   --   AST 34  --  36  --   --   --   --   ALT 68*  --  62*  --   --   --   --   ALKPHOS 71  --  64  --   --   --   --   BILITOT 1.4*  --  0.9  --   --   --   --   GFRNONAA >60   < > >60   < > >60 >60 >60  ANIONGAP 9   < > 8   < > 9 9 11    < > = values in this interval not displayed.     Hematology Recent Labs  Lab 10/08/20 0411 10/10/20 1547 10/11/20 0511  WBC 6.3 10.1 8.3  RBC 4.04* 4.77 4.38  HGB 14.2 16.4 15.1  HCT 40.7 48.9 45.2  MCV 100.7* 102.5* 103.2*  MCH 35.1* 34.4* 34.5*  MCHC 34.9 33.5 33.4  RDW 12.8 13.0 12.8  PLT 180 253 210    BNP Recent Labs  Lab 10/10/20 1547  BNP 973.5*     DDimer No results for input(s): DDIMER in the last 168 hours.   Radiology    No results found.  Cardiac Studies   2D echo 06/08/2020: 1. Left ventricular ejection fraction, by estimation, is 20 to 25%. The  left ventricle has severely decreased function. The left ventricle    demonstrates global hypokinesis. Left ventricular diastolic function could  not be evaluated.  2. Right ventricular systolic function is normal. The right ventricular  size is mildly enlarged.  3. The mitral valve was not well visualized. No evidence of mitral valve  regurgitation.  4. The aortic valve was not well visualized. Aortic valve regurgitation  not well assessed.  5. The inferior vena cava is dilated in size with >50% respiratory  variability, suggesting right atrial pressure of 8 mmHg.  __________  [facility] 06/10/2020: Conclusions: 1. Mild to moderate, nonobstructive coronary artery disease with up to 50% stenoses involving small D1 branch as well as mid RCA. Systolic dysfunction is out of proportion to CAD, consistent with nonischemic cardiomyopathy. 2. Mildly elevated left and right heart filling pressures. 3. Low normal to mildly reduced Fick cardiac output.  Recommendations: 1. Optimize goal-directed medical therapy for management of nonischemic cardiomyopathy. Smoking, tobacco, and drug cessation encouraged, as cardiomyopathy certainly could be related to substance abuse. 2. Medical therapy and risk factor modification to prevent progression of coronary artery disease. __________  2D echo 10/06/2020: 1. Left ventricular ejection fraction, by estimation, is 20 to 25%. The  left ventricle has severely decreased function. The left ventricle has no  regional wall motion abnormalities. The left ventricular internal cavity  size was moderately dilated. Left  ventricular diastolic parameters were normal.  2. Right ventricular systolic function is moderately reduced. The right  ventricular size is normal. Tricuspid regurgitation signal is inadequate  for assessing PA pressure.  3. Mild mitral valve regurgitation.  4. The inferior vena cava is dilated in size with >50% respiratory  variability, suggesting right atrial pressure of 8 mmHg.  Patient Profile     64  y.o. male with history of BiV failure secondary to nonischemic cardiomyopathy, and  polysubstance use including cocaine, alcohol, and tobacco use, COPD, chronic low back pain on narcotic medication, hypertension, family history of CAD with brother deceased from MI in his 52s and mother deceased in her 45s from possible heart failure, and who is being seen today for acute exacerbation of HFrEF.  Assessment & Plan    Acute on chronic respiratory failure --Denies current shortness of breath, though remains on nasal cannula oxygen at the time of our exam.  He denies any orthopnea. --Likely multifactorial in the setting of acute on chronic HFrEF with RV dysfunction and acute COPD exacerbation with documented nonadherence with medical therapy. --Continue current supplemental oxygen.  Recommend wean as tolerated. --Before discharge, he will need to ambulate with monitoring of his oxygen saturations off of oxygen.  Acute on chronic HFrEF with RV dysfunction secondary to nonischemic cardiomyopathy --Denies any shortness of breath.  Reports improving lower extremity edema.   --Continues to appear volume overloaded. --Wt 85.5kg  84.1kg.  --Output yesterday of 4L with net neg -3.6L. Output today 850cc.  --Renal function stable. Cr 0.91  0.98 with BUN 27. --Continue IV diuresis with Lasix 40 mg every 8 hours until bump in renal function or euvolemic on exam.  As previously noted, preferences for diuresis until slightly dry on exam. --Started losartan 12.5mg  yesterday with stable renal function today. --Ideally, escalation of GDMT with possible transition to [facility] by discharge or in the outpatient setting recommended.  Could also consider addition of spironolactone. --ReDS vest and CHF education.  --Outpatient limited echo once patient demonstrates medical management adherence and in order to reevaluate LVSF.  If LV SF remains reduced on optimal medical therapy, consider referral to EP for consideration of  ICD.  Hyperkalemia --Resolved. Continue to monitor.   Palpitations/atrial tachycardia --Continue current carvedilol.  Continue to monitor electrolytes.  TSH WNL.  Polysubstance use --Complete cessation recommended.  COPD exacerbation --Per IM. --Wean steroids as tolerated.  HLD --Continue statin.  Dysphagia --Per IM  For questions or updates, please contact [facility] Please consult [email or web address] for contact info under        Signed, Lennon Alstrom, PA-C  10/14/2020, 3:17 PM

## 2020-10-14 NOTE — Plan of Care (Signed)
Continuing with plan of care. 

## 2020-10-14 NOTE — TOC Progression Note (Signed)
Transition of Care [facility]) - Progression Note    Patient Details  Name: Kurt Blankenship MRN: [id number] Date of Birth: 1956-02-08  [facility] CM/SW Contact  Margarito Liner, LCSW Phone Number: 10/14/2020, 12:50 PM  Clinical Narrative:  Plan for potential discharge tomorrow. [facility] liaison is aware.   Expected Discharge Plan: Home w Hospice Care Barriers to Discharge: Continued Medical Work up  Expected Discharge Plan and Services Expected Discharge Plan: Home w Hospice Care     Post Acute Care Choice: Hospice Living arrangements for the past 2 months: Single Family Home                                       Social Determinants of Health (SDOH) Interventions    Readmission Risk Interventions Readmission Risk Prevention Plan 10/12/2020  HRI or Home Care Consult (No Data)  Palliative Care Screening Complete  Medication Review (RN Care Manager) Complete  Some recent data might be hidden

## 2020-10-15 DIAGNOSIS — I5023 Acute on chronic systolic (congestive) heart failure: Secondary | ICD-10-CM | POA: Diagnosis not present

## 2020-10-15 DIAGNOSIS — J441 Chronic obstructive pulmonary disease with (acute) exacerbation: Secondary | ICD-10-CM | POA: Diagnosis not present

## 2020-10-15 LAB — BASIC METABOLIC PANEL
Anion gap: 8 (ref 5–15)
BUN: 22 mg/dL (ref 8–23)
CO2: 40 mmol/L — ABNORMAL HIGH (ref 22–32)
Calcium: 8.9 mg/dL (ref 8.9–10.3)
Chloride: 88 mmol/L — ABNORMAL LOW (ref 98–111)
Creatinine, Ser: 0.69 mg/dL (ref 0.61–1.24)
GFR, Estimated: >60 mL/min (ref >60)
Glucose, Bld: 94 mg/dL (ref 70–99)
Potassium: 3.6 mmol/L (ref 3.5–5.1)
Sodium: 136 mmol/L (ref 135–145)

## 2020-10-15 LAB — BRAIN NATRIURETIC PEPTIDE: B Natriuretic Peptide: 1151.4 pg/mL — ABNORMAL HIGH (ref 0.0–100.0)

## 2020-10-15 MED ORDER — ACETAZOLAMIDE 250 MG PO TABS
500.0000 mg | ORAL_TABLET | Freq: Two times a day (BID) | ORAL | Status: AC
  Administered 2020-10-15 – 2020-10-16 (×2): 500 mg via ORAL
  Filled 2020-10-15 (×2): qty 2

## 2020-10-15 MED ORDER — POTASSIUM CHLORIDE CRYS ER 20 MEQ PO TBCR
40.0000 meq | EXTENDED_RELEASE_TABLET | Freq: Every day | ORAL | Status: DC
  Administered 2020-10-15 – 2020-10-17 (×3): 40 meq via ORAL
  Filled 2020-10-15 (×3): qty 2

## 2020-10-15 NOTE — Progress Notes (Signed)
Patient continues to be non-compliant with leaving cardiac and pulse ox monitoring on along with oxygen via nasal cannula by placing them on and off.  Patient has been educated and encouraged to keep the oxygen on and cardiac and pulse ox monitoring.  Dr. Marylu Lund notified as well.  Will continue to encourage and educated the patient as well as monitor.

## 2020-10-15 NOTE — Progress Notes (Signed)
Pt declined to wear his telemetry and pulse ox monitor at 0230 this morning as it was 'interfering with his sleep and he feels like he is being strangled'. Spot check sats remain stable. Will try to restart monitor once awake and will inform day MD about issues.

## 2020-10-15 NOTE — Progress Notes (Addendum)
Progress Note  Patient Name: Kurt Blankenship Date of Encounter: 10/15/2020  Primary Cardiologist: Yvonne Kendall, MD   Subjective   No CP, racing heart rate, palpitations, or shortness of breath.  He is no longer on nasal cannula oxygen as of today.  He feels his lower extremity edema is improving but still present.  He took off his telemetry.  He wants to go home.  Long discussion regarding the importance of ensuring he is diuresed before discharge, so that he does not end up back in the hospital within a few days.  After further discussion, he is agreeable to stay in the hospital for at least 1 day.  Inpatient Medications    Scheduled Meds: . aspirin EC  81 mg Oral Daily  . atorvastatin  20 mg Oral QHS  . carvedilol  3.125 mg Oral BID WC  . enoxaparin (LOVENOX) injection  40 mg Subcutaneous Q24H  . furosemide  40 mg Intravenous Q8H  . gabapentin  300 mg Oral BID  . Ipratropium-Albuterol  1 puff Inhalation Q6H WA  . losartan  25 mg Oral Daily  . nicotine  21 mg Transdermal Daily   Continuous Infusions:  PRN Meds: acetaminophen **OR** acetaminophen, albuterol, ondansetron **OR** ondansetron (ZOFRAN) IV, oxyCODONE, traZODone   Vital Signs    Vitals:   10/14/20 1920 10/14/20 2300 10/15/20 0323 10/15/20 0500  BP: 107/75 102/69 93/66   Pulse: 76 84 79   Resp: 18 18 16    Temp: 97.8 F (36.6 C) 97.9 F (36.6 C) 97.8 F (36.6 C)   TempSrc: Oral Oral Oral   SpO2: 95% 95% 98%   Weight:    82.2 kg  Height:        Intake/Output Summary (Last 24 hours) at 10/15/2020 1024 Last data filed at 10/15/2020 0902 Gross per 24 hour  Intake 840 ml  Output 5240 ml  Net -4400 ml   Last 3 Weights 10/15/2020 10/14/2020 10/13/2020  Weight (lbs) 181 lb 3.5 oz 185 lb 6.4 oz 188 lb 9.6 oz  Weight (kg) 82.2 kg 84.097 kg 85.548 kg      Telemetry    Off of telemetry per patient request- Personally Reviewed  ECG    No new tracings- Personally Reviewed  Physical Exam    GEN: No acute distress.   Neck:  JVP ~9-10cm Cardiac: RRR, no murmurs, rubs, or gallops.  Respiratory:  Coarse breath sounds bilaterally. GI: Soft, nontender, non-distended  MS:  1-2+ bilateral lower extremity edema; No deformity. Neuro:  Nonfocal  Psych: Normal affect   Labs    High Sensitivity Troponin:   Recent Labs  Lab 10/05/20 1625 10/05/20 1933 10/06/20 0409 10/10/20 1547 10/10/20 1817  TROPONINIHS 49* 30* 40* 41* 41*      Cardiac EnzymesNo results for input(s): TROPONINI in the last 168 hours. No results for input(s): TROPIPOC in the last 168 hours.   Chemistry Recent Labs  Lab 10/10/20 1547 10/10/20 1547 10/11/20 0511 10/12/20 0900 10/13/20 0609 10/14/20 0547 10/15/20 0446  NA 135   < > 131*   < > 136 136 136  K 3.7   < > 5.3*   < > 4.7 3.9 3.6  CL 93*   < > 92*   < > 91* 88* 88*  CO2 33*   < > 31   < > 36* 37* 40*  GLUCOSE 159*   < > 217*   < > 86 114* 94  BUN 18   < > 18   < >  27* 27* 22  CREATININE 0.65   < > 0.67   < > 0.91 0.98 0.69  CALCIUM 9.4   < > 9.1   < > 9.7 9.3 8.9  PROT 7.1  --  5.9*  --   --   --   --   ALBUMIN 4.1  --  3.5  --   --   --   --   AST 34  --  36  --   --   --   --   ALT 68*  --  62*  --   --   --   --   ALKPHOS 71  --  64  --   --   --   --   BILITOT 1.4*  --  0.9  --   --   --   --   GFRNONAA >60   < > >60   < > >60 >60 >60  ANIONGAP 9   < > 8   < > 9 11 8    < > = values in this interval not displayed.     Hematology Recent Labs  Lab 10/10/20 1547 10/11/20 0511  WBC 10.1 8.3  RBC 4.77 4.38  HGB 16.4 15.1  HCT 48.9 45.2  MCV 102.5* 103.2*  MCH 34.4* 34.5*  MCHC 33.5 33.4  RDW 13.0 12.8  PLT 253 210    BNP Recent Labs  Lab 10/10/20 1547 10/15/20 0446  BNP 973.5* 1,151.4*     DDimer No results for input(s): DDIMER in the last 168 hours.   Radiology    No results found.  Cardiac Studies   2D echo 06/08/2020: 1. Left ventricular ejection fraction, by estimation, is 20 to 25%. The  left ventricle  has severely decreased function. The left ventricle  demonstrates global hypokinesis. Left ventricular diastolic function could  not be evaluated.  2. Right ventricular systolic function is normal. The right ventricular  size is mildly enlarged.  3. The mitral valve was not well visualized. No evidence of mitral valve  regurgitation.  4. The aortic valve was not well visualized. Aortic valve regurgitation  not well assessed.  5. The inferior vena cava is dilated in size with >50% respiratory  variability, suggesting right atrial pressure of 8 mmHg.  __________  [facility] 06/10/2020: Conclusions: 1. Mild to moderate, nonobstructive coronary artery disease with up to 50% stenoses involving small D1 branch as well as mid RCA. Systolic dysfunction is out of proportion to CAD, consistent with nonischemic cardiomyopathy. 2. Mildly elevated left and right heart filling pressures. 3. Low normal to mildly reduced Fick cardiac output.  Recommendations: 1. Optimize goal-directed medical therapy for management of nonischemic cardiomyopathy. Smoking, tobacco, and drug cessation encouraged, as cardiomyopathy certainly could be related to substance abuse. 2. Medical therapy and risk factor modification to prevent progression of coronary artery disease. __________  2D echo 10/06/2020: 1. Left ventricular ejection fraction, by estimation, is 20 to 25%. The  left ventricle has severely decreased function. The left ventricle has no  regional wall motion abnormalities. The left ventricular internal cavity  size was moderately dilated. Left  ventricular diastolic parameters were normal.  2. Right ventricular systolic function is moderately reduced. The right  ventricular size is normal. Tricuspid regurgitation signal is inadequate  for assessing PA pressure.  3. Mild mitral valve regurgitation.  4. The inferior vena cava is dilated in size with >50% respiratory  variability, suggesting right  atrial pressure of 8 mmHg.  Patient Profile  64 y.o. male with history of BiV failure secondary to nonischemic cardiomyopathy, and polysubstance use including cocaine, alcohol, and tobacco use, COPD, chronic low back pain on narcotic medication, hypertension, family history of CAD with brother deceased from MI in his 26s and mother deceased in her 97s from possible heart failure, and who is being seen today for acute exacerbation of HFrEF.  Assessment & Plan    Acute on chronic respiratory failure --Denies current shortness of breath.   --Likely multifactorial in the setting of acute on chronic HFrEF with RV dysfunction and acute COPD exacerbation with documented nonadherence with medical therapy. --Agreeable to at least 1 more day of hospitalization after long discussion. --Before discharge, he will need to ambulate with monitoring of his oxygen saturations off of oxygen.  Acute on chronic HFrEF with RV dysfunction secondary to nonischemic cardiomyopathy --Denies any shortness of breath.  Reports improving lower extremity edema.   --Continues to appear volume overloaded. --Wt not documented today. Wt yesterday 82.2kg. --Output yesterday of 4.9L with net neg -4.060L. --Cr 0.98  0.69, BUN 27  22. Renal function improving. --Still significantly volume overloaded.  BNP 1,151.4. --Continue IV diuresis with Lasix 40 mg every 8 hours until bump in renal function or euvolemic on exam. Preferences for diuresis until slightly dry on exam. --Ideally, escalation of GDMT with possible transition from [facility] to [facility] by discharge or in the outpatient setting recommended.  Could also consider addition of spironolactone. --Daily ReDS vest and CHF education.  --He is agreeable to stay for at least 1 more day.  Long discussion regarding the importance of ensuring he is properly diuresed before discharge.  He reports a desire to go home. --Outpatient limited echo once patient demonstrates medical  management adherence and in order to reevaluate LVSF.  If LV SF remains reduced on optimal medical therapy, consider referral to EP for consideration of ICD.  Hypokalemia --K 3.6.  Will order KCl supplementation with KCl tab 40 M EQ x1.  He will likely also need daily potassium supplementation going forward.  We will plan for KCl tab 40 M EQ daily for now and reassess in the a.m.  Palpitations/atrial tachycardia --Continue current carvedilol 3.125 mg twice daily.  Continue to monitor electrolytes.  TSH WNL.  Recommendation is to restart telemetry if patient permits.  Polysubstance use --Complete cessation recommended.  COPD exacerbation --Per IM.  HLD --Continue atorvastatin 20 mg daily.  Dysphagia --Per IM  For questions or updates, please contact [facility] Please consult [email or web address] for contact info under        Signed, Lennon Alstrom, PA-C  10/15/2020, 10:24 AM

## 2020-10-15 NOTE — Progress Notes (Signed)
PROGRESS NOTE    Kurt Blankenship  YCX:[id number] DOB: [birth year] DOA: 10/10/2020 PCP: System, Provider Not In    Brief Narrative:  Kurt Blankenship is a 64 y.o. male with medical history significant for chronic systolic heart failure with most recent echocardiogram in October 2021 showing LVEF 20 to 25%, hypertension, COPD, chronic tobacco abuse, chronic pain syndrome, who is admitted to [facility] on 10/10/2020 with acute hypoxic respiratory failure in the setting of acute on chronic systolic heart failure as well as acute COPD exacerbation after presenting from home to [facility] complaining of shortness of breath.   10/27- pt has decided to go home with hospice once stable. 10/29- no over night issues. Weight down Good UO 10/30-non compliant with tele and oxygen [place]. States oxygen makes him "nauseous".     Consultants:   Cardiology, palliative care  Procedures:   Antimicrobials:       Subjective: Sleepy this AM.  Shortness of breath with mild improvement.  Good +urine output . Has taken 02 [place] off despite asking him to use it as he needs it.  Asking if he could go home.  Objective: Vitals:   10/14/20 2300 10/15/20 0323 10/15/20 0500 10/15/20 1254  BP: 102/69 93/66  110/77  Pulse: 84 79  83  Resp: 18 16  14   Temp: 97.9 F (36.6 C) 97.8 F (36.6 C)  (!) 97.5 F (36.4 C)  TempSrc: Oral Oral  Oral  SpO2: 95% 98%  91%  Weight:   82.2 kg   Height:        Intake/Output Summary (Last 24 hours) at 10/15/2020 1543 Last data filed at 10/15/2020 0902 Gross per 24 hour  Intake 840 ml  Output 4390 ml  Net -3550 ml   Filed Weights   10/13/20 0514 10/14/20 0500 10/15/20 0500  Weight: 85.5 kg 84.1 kg 82.2 kg    Examination: Sleepy, calm comfortable Positive fine rales bilaterally Positive JVD, regular S1-S2 no murmurs Still distended positive bowel sounds nontender 2+ pitting edema mildly less than yesterday Alert  oriented x3, grossly intact Mood and affect appropriate in current setting   Data Reviewed: I have personally reviewed following labs and imaging studies  CBC: Recent Labs  Lab 10/10/20 1547 10/11/20 0511  WBC 10.1 8.3  NEUTROABS 7.2  --   HGB 16.4 15.1  HCT 48.9 45.2  MCV 102.5* 103.2*  PLT 253 210   Basic Metabolic Panel: Recent Labs  Lab 10/10/20 1547 10/10/20 1547 10/11/20 0511 10/12/20 0900 10/13/20 0609 10/14/20 0547 10/15/20 0446  NA 135   < > 131* 132* 136 136 136  K 3.7   < > 5.3* 5.6* 4.7 3.9 3.6  CL 93*   < > 92* 90* 91* 88* 88*  CO2 33*   < > 31 33* 36* 37* 40*  GLUCOSE 159*   < > 217* 189* 86 114* 94  BUN 18   < > 18 26* 27* 27* 22  CREATININE 0.65   < > 0.67 0.94 0.91 0.98 0.69  CALCIUM 9.4   < > 9.1 9.8 9.7 9.3 8.9  MG 2.1  --  2.0  --   --   --   --   PHOS  --   --  4.0  --   --   --   --    < > = values in this interval not displayed.   GFR: Estimated Creatinine Clearance: 94.5 mL/min (by C-G formula based on  SCr of 0.69 mg/dL). Liver Function Tests: Recent Labs  Lab 10/10/20 1547 10/11/20 0511  AST 34 36  ALT 68* 62*  ALKPHOS 71 64  BILITOT 1.4* 0.9  PROT 7.1 5.9*  ALBUMIN 4.1 3.5   No results for input(s): LIPASE, AMYLASE in the last 168 hours. No results for input(s): AMMONIA in the last 168 hours. Coagulation Profile: Recent Labs  Lab 10/11/20 0511  INR 1.1   Cardiac Enzymes: No results for input(s): CKTOTAL, CKMB, CKMBINDEX, TROPONINI in the last 168 hours. BNP (last 3 results) No results for input(s): PROBNP in the last 8760 hours. HbA1C: No results for input(s): HGBA1C in the last 72 hours. CBG: No results for input(s): GLUCAP in the last 168 hours. Lipid Profile: No results for input(s): CHOL, HDL, LDLCALC, TRIG, CHOLHDL, LDLDIRECT in the last 72 hours. Thyroid Function Tests: No results for input(s): TSH, T4TOTAL, FREET4, T3FREE, THYROIDAB in the last 72 hours. Anemia Panel: No results for input(s): VITAMINB12,  FOLATE, FERRITIN, TIBC, IRON, RETICCTPCT in the last 72 hours. Sepsis Labs: Recent Labs  Lab 10/11/20 0511  PROCALCITON <0.10    Recent Results (from the past 240 hour(s))  Respiratory Panel by RT PCR (Flu A&B, Covid) - Nasopharyngeal Swab     Status: None   Collection Time: 10/10/20  4:24 PM   Specimen: Nasopharyngeal Swab  Result Value Ref Range Status   SARS Coronavirus 2 by RT PCR NEGATIVE NEGATIVE Final    Comment: (NOTE) SARS-CoV-2 target nucleic acids are NOT DETECTED.  The SARS-CoV-2 RNA is generally detectable in upper respiratoy specimens during the acute phase of infection. The lowest concentration of SARS-CoV-2 viral copies this assay can detect is 131 copies/mL. A negative result does not preclude SARS-Cov-2 infection and should not be used as the sole basis for treatment or other patient management decisions. A negative result may occur with  improper specimen collection/handling, submission of specimen other than nasopharyngeal swab, presence of viral mutation(s) within the areas targeted by this assay, and inadequate number of viral copies (<131 copies/mL). A negative result must be combined with clinical observations, patient history, and epidemiological information. The expected result is Negative.  Fact Sheet for Patients:  https://www.moore.com/  Fact Sheet for Healthcare Providers:  https://www.young.biz/  This test is no t yet approved or cleared by the [place] FDA and  has been authorized for detection and/or diagnosis of SARS-CoV-2 by FDA under an Emergency Use Authorization (EUA). This EUA will remain  in effect (meaning this test can be used) for the duration of the COVID-19 declaration under Section 564(b)(1) of the Act, 21 U.S.C. section 360bbb-3(b)(1), unless the authorization is terminated or revoked sooner.     Influenza A by PCR NEGATIVE NEGATIVE Final   Influenza B by PCR NEGATIVE NEGATIVE Final     Comment: (NOTE) The Xpert Xpress SARS-CoV-2/FLU/RSV assay is intended as an aid in  the diagnosis of influenza from Nasopharyngeal swab specimens and  should not be used as a sole basis for treatment. Nasal washings and  aspirates are unacceptable for Xpert Xpress SARS-CoV-2/FLU/RSV  testing.  Fact Sheet for Patients: https://www.moore.com/  Fact Sheet for Healthcare Providers: https://www.young.biz/  This test is not yet approved or cleared by the [place] FDA and  has been authorized for detection and/or diagnosis of SARS-CoV-2 by  FDA under an Emergency Use Authorization (EUA). This EUA will remain  in effect (meaning this test can be used) for the duration of the  Covid-19 declaration under Section 564(b)(1) of the  Act, 21  U.S.C. section 360bbb-3(b)(1), unless the authorization is  terminated or revoked. Performed at [facility], [address] [place], [place] [address]          Radiology Studies: No results found.      Scheduled Meds: . aspirin EC  81 mg Oral Daily  . atorvastatin  20 mg Oral QHS  . carvedilol  3.125 mg Oral BID WC  . enoxaparin (LOVENOX) injection  40 mg Subcutaneous Q24H  . furosemide  40 mg Intravenous Q8H  . gabapentin  300 mg Oral BID  . Ipratropium-Albuterol  1 puff Inhalation Q6H WA  . losartan  25 mg Oral Daily  . nicotine  21 mg Transdermal Daily  . potassium chloride  40 mEq Oral Daily   Continuous Infusions:  Assessment & Plan:   Principal Problem:   Acute on chronic systolic CHF (congestive heart failure) (HCC) Active Problems:   Hypertension   COPD exacerbation (HCC)   HLD (hyperlipidemia)   Tobacco abuse   SOB (shortness of breath)   Acute respiratory failure with hypoxia (HCC)   Acute on chronic respiratory failure with hypoxia and hypercapnia (HCC)   Acute on chronic systolic CHF (congestive heart failure) (HCC): -2D echo on 6/20//21 and 10/06/20 showed  EF of 20-25%.  10/30-still volume overloaded bnp high  1,151.4 Good urine output Continue with Lasix 3 times daily IV I's and O's Cardiology following Alkalosis secondary to diuresis, will add diamox Renal function stable  Continue on coreg and losartan Possible transition to entresto by discharge or as outpatient    COPD exacerbation (HCC): Improved. No further exacerbation Iv steorid was d/c'd Noncompliant with [place] oxygen despite counseling on importance of wearing it when 02 sat drops  Keep 02 sat >92 On chronic 02   Elevated troponin:Troponin 41-->41.  Due to demand ischemia, no chest pain 10/30-Continue with asa/beta blk/statins    Hyperkalemia- K was 5.6, given dose of Kayexalate, with improvement Remains stable while diuresing.  Continue to monitor   HTN (hypertension) Stable , continue with Coreg, losartan and IV Lasix     HLD (hyperlipidemia):  Continue with statins   h/o Tobacco abuse and Alcohol abuse: -Was counseled on quitting  Continue nicotine patch  -watch for withdrawal symptoms from alcohol  Hyperglycemia suspect steroid induced -A1c 5.6 Improved  palliative care consultation. home with hospice   DVT prophylaxis: Lovenox  code Status: DNR Family Communication: None at bedside   Status is: Inpatient  Remains inpatient appropriate because:IV treatments appropriate due to intensity of illness or inability to take PO   Dispo: The patient is from: Home              Anticipated d/c is to: Home              Anticipated d/c date is: 1-2 days              Patient currently is not medically stable to d/c.needs iv lasix . Need to optimize medical therapy prior to discharge to prevent readmission then home with hospice            LOS: 5 days   Time spent: 35 min with >50% on coc    Lynn Ito, MD [facility] Pager [phone]  If 7PM-7AM, please contact night-coverage [email or web address] Password  [facility] 10/15/2020, 3:43 PM

## 2020-10-15 NOTE — Plan of Care (Signed)
Continuing with plan of care. 

## 2020-10-16 DIAGNOSIS — I5023 Acute on chronic systolic (congestive) heart failure: Secondary | ICD-10-CM | POA: Diagnosis not present

## 2020-10-16 DIAGNOSIS — J9622 Acute and chronic respiratory failure with hypercapnia: Secondary | ICD-10-CM | POA: Diagnosis not present

## 2020-10-16 DIAGNOSIS — J9621 Acute and chronic respiratory failure with hypoxia: Secondary | ICD-10-CM | POA: Diagnosis not present

## 2020-10-16 LAB — BASIC METABOLIC PANEL
Anion gap: 8 (ref 5–15)
BUN: 18 mg/dL (ref 8–23)
CO2: 39 mmol/L — ABNORMAL HIGH (ref 22–32)
Calcium: 9.4 mg/dL (ref 8.9–10.3)
Chloride: 88 mmol/L — ABNORMAL LOW (ref 98–111)
Creatinine, Ser: 0.82 mg/dL (ref 0.61–1.24)
GFR, Estimated: >60 mL/min (ref >60)
Glucose, Bld: 115 mg/dL — ABNORMAL HIGH (ref 70–99)
Potassium: 3.7 mmol/L (ref 3.5–5.1)
Sodium: 135 mmol/L (ref 135–145)

## 2020-10-16 LAB — BRAIN NATRIURETIC PEPTIDE: B Natriuretic Peptide: 923.4 pg/mL — ABNORMAL HIGH (ref 0.0–100.0)

## 2020-10-16 MED ORDER — PHENOL 1.4 % MT LIQD
1.0000 | OROMUCOSAL | Status: DC | PRN
  Filled 2020-10-16: qty 177

## 2020-10-16 MED ORDER — CARVEDILOL 6.25 MG PO TABS
6.2500 mg | ORAL_TABLET | Freq: Two times a day (BID) | ORAL | Status: DC
  Administered 2020-10-16 – 2020-10-17 (×2): 6.25 mg via ORAL
  Filled 2020-10-16 (×2): qty 1

## 2020-10-16 MED ORDER — FUROSEMIDE 10 MG/ML IJ SOLN
60.0000 mg | Freq: Three times a day (TID) | INTRAMUSCULAR | Status: DC
  Administered 2020-10-16 – 2020-10-17 (×3): 60 mg via INTRAVENOUS
  Filled 2020-10-16 (×3): qty 8

## 2020-10-16 NOTE — Plan of Care (Signed)

## 2020-10-16 NOTE — Progress Notes (Signed)
[facility]  - [place] [facility]   PATIENT NAME: Kurt Blankenship    MR#:  [id number]  DATE OF BIRTH:  03/24/1956  SUBJECTIVE:   Ambulating in the hallways. Improving. Still has leg edema. Shortness of breath better. REVIEW OF SYSTEMS:   Review of Systems  Constitutional: Negative for chills, fever and weight loss.  HENT: Negative for ear discharge, ear pain and nosebleeds.   Eyes: Negative for blurred vision, pain and discharge.  Respiratory: Positive for shortness of breath. Negative for sputum production, wheezing and stridor.   Cardiovascular: Positive for leg swelling. Negative for chest pain, palpitations, orthopnea and PND.  Gastrointestinal: Negative for abdominal pain, diarrhea, nausea and vomiting.  Genitourinary: Negative for frequency and urgency.  Musculoskeletal: Negative for back pain and joint pain.  Neurological: Positive for weakness. Negative for sensory change, speech change and focal weakness.  Psychiatric/Behavioral: Negative for depression and hallucinations. The patient is not nervous/anxious.    Tolerating Diet:yes Tolerating PT:   DRUG ALLERGIES:  No Known Allergies  VITALS:  Blood pressure 102/74, pulse 86, temperature 97.6 F (36.4 C), temperature source Oral, resp. rate 16, height 5\' 9"  (1.753 m), weight 82.4 kg, SpO2 94 %.  PHYSICAL EXAMINATION:   Physical Exam  GENERAL:  64 y.o.-year-old patient lying in the bed with no acute distress.  HEENT: Head atraumatic, normocephalic. Oropharynx and nasopharynx clear.  NECK:  Supple, no jugular venous distention. No thyroid enlargement, no tenderness.  LUNGS: Normal breath sounds bilaterally, no wheezing, rales, rhonchi. No use of accessory muscles of respiration.  CARDIOVASCULAR: S1, S2 normal. No murmurs, rubs, or gallops.  ABDOMEN: Soft, nontender, nondistended. Bowel sounds present. No organomegaly or mass.  EXTREMITIES: ++ edema b/l.    NEUROLOGIC: Cranial nerves II through XII are  intact. No focal Motor or sensory deficits b/l.   PSYCHIATRIC:  patient is alert and oriented x 3.  SKIN: No obvious rash, lesion, or ulcer.   LABORATORY PANEL:  CBC Recent Labs  Lab 10/11/20 0511  WBC 8.3  HGB 15.1  HCT 45.2  PLT 210    Chemistries  Recent Labs  Lab 10/11/20 0511 10/12/20 0900 10/16/20 0437  NA 131*   < > 135  K 5.3*   < > 3.7  CL 92*   < > 88*  CO2 31   < > 39*  GLUCOSE 217*   < > 115*  BUN 18   < > 18  CREATININE 0.67   < > 0.82  CALCIUM 9.1   < > 9.4  MG 2.0  --   --   AST 36  --   --   ALT 62*  --   --   ALKPHOS 64  --   --   BILITOT 0.9  --   --    < > = values in this interval not displayed.   Cardiac Enzymes No results for input(s): TROPONINI in the last 168 hours. RADIOLOGY:  No results found. ASSESSMENT AND PLAN:  KINSEY COWSERT a [age] Os[age] y.o.malewith medical history significant forchronic systolic heart failure with most recent echocardiogram in October 2021 showing LVEF 20 to 25%, hypertension, COPD, chronic tobacco abuse, chronic pain syndrome,who is admitted to [facility] on 10/25/2021with acute hypoxic respiratory failure in the setting of acute on chronic systolic heart failure as well as acute COPD exacerbationafter presenting from home to [facility] Department complaining of shortness of breath   Acute on chronic systolic CHF (congestive heart failure) (HCC): -  2D echo on 6/20//21and 10/21/21showed EF of 20-25%.  bnp down to 900 Good urine output 17 liters since admission -Continue with Lasix 3 times daily IV -Cardiology following -Alkalosis secondary to diuresis, on diamox -Renal function stable  --Continue on coreg and losartan -Possible transition to entresto by discharge or as outpatient -patient will benefit from [facility] heart failure clinic at discharge  COPD exacerbation [facility]): -Improved. No further exacerbation -Iv steorid was d/c'd -Noncompliant with [place] oxygen despite  counseling on importance of wearing it when 02 sat drops  -Keep 02 sat >92 -On chronic 02 Elevated troponin:Troponin41-->41.  -Due to demand ischemia, no chest pain -Continue with asa/beta blk/statins  Hyperkalemia K stable  HTN (hypertension) Stable , continue with Coreg, losartan and IV Lasix   HLD (hyperlipidemia):  - statins  h/oTobacco abuse and Alcohol abuse: -Was counseled on quitting  -Continue nicotine patch  -watch for withdrawal symptoms from alcohol  Hyperglycemia suspect steroid induced -A1c 5.6 Improved  palliative care consultation.home with hospice   DVT prophylaxis: Lovenox  code Status: DNR Family Communication:  spoke with patient's cousin brother Lucy Antigua. Patient is going to be staying with. Please call cousin at discharge. Status is: Inpatient  Remains inpatient appropriate because:IV treatments appropriate due to intensity of illness or inability to take PO   Dispo: The patient is from: Home  Anticipated d/c is to: Home  Anticipated d/c date is: likely Monday nov 1--d/w Dr Chryl Heck cardiology  Patient currently is not medically stable to d/c.needs iv lasix . Need to optimize medical therapy prior to discharge to prevent readmission then home with hospice       TOTAL TIME TAKING CARE OF THIS PATIENT: *25 minutes.  >50% time spent on counselling and coordination of care  Note: This dictation was prepared with Dragon dictation along with smaller phrase technology. Any transcriptional errors that result from this process are unintentional.  Enedina Finner M.D    [facility]   CC: Primary care physician; System, Provider Not InPatient ID: RILEE WENDLING, male   DOB: 23-May-1956, 64 y.o.   MRN: [id number]

## 2020-10-16 NOTE — Progress Notes (Signed)
Progress Note  Patient Name: Kurt Blankenship Date of Encounter: 10/16/2020  Primary Cardiologist: Yvonne Kendall, MD   Subjective  NAEO. Feels like swelling continues to slowly improve.   Inpatient Medications    Scheduled Meds: . aspirin EC  81 mg Oral Daily  . atorvastatin  20 mg Oral QHS  . carvedilol  3.125 mg Oral BID WC  . enoxaparin (LOVENOX) injection  40 mg Subcutaneous Q24H  . furosemide  40 mg Intravenous Q8H  . gabapentin  300 mg Oral BID  . Ipratropium-Albuterol  1 puff Inhalation Q6H WA  . losartan  25 mg Oral Daily  . nicotine  21 mg Transdermal Daily  . potassium chloride  40 mEq Oral Daily   Continuous Infusions:  PRN Meds: acetaminophen **OR** acetaminophen, albuterol, ondansetron **OR** ondansetron (ZOFRAN) IV, oxyCODONE, phenol, traZODone   Vital Signs    Vitals:   10/15/20 1927 10/15/20 2333 10/16/20 0449 10/16/20 0957  BP: (!) 107/92 118/80 115/81 102/74  Pulse: 80 83 86 86  Resp: 18 20 20 16   Temp: 97.7 F (36.5 C) (!) 97.5 F (36.4 C) 97.8 F (36.6 C) 97.6 F (36.4 C)  TempSrc: Axillary  Oral Oral  SpO2: 100% 100% 94% 94%  Weight:   82.4 kg   Height:        Intake/Output Summary (Last 24 hours) at 10/16/2020 1040 Last data filed at 10/16/2020 1000 Gross per 24 hour  Intake 0 ml  Output 4800 ml  Net -4800 ml   Last 3 Weights 10/16/2020 10/15/2020 10/14/2020  Weight (lbs) 181 lb 10.5 oz 181 lb 3.5 oz 185 lb 6.4 oz  Weight (kg) 82.4 kg 82.2 kg 84.097 kg       ECG    No new tracings- Personally Reviewed  Physical Exam   GEN: No acute distress.   Neck:  JVP mid neck at 45degrees Cardiac: RRR, no murmurs, rubs, or gallops.  Respiratory:  Good air movement bilaterally to the bases GI: Soft, nontender, non-distended  MS:  1-2+ bilateral lower extremity edema; No deformity. Neuro:  Nonfocal  Psych: Normal affect   Labs    High Sensitivity Troponin:   Recent Labs  Lab 10/05/20 1625 10/05/20 1933 10/06/20 0409  10/10/20 1547 10/10/20 1817  TROPONINIHS 49* 30* 40* 41* 41*      Cardiac EnzymesNo results for input(s): TROPONINI in the last 168 hours. No results for input(s): TROPIPOC in the last 168 hours.   Chemistry Recent Labs  Lab 10/10/20 1547 10/10/20 1547 10/11/20 0511 10/12/20 0900 10/14/20 0547 10/15/20 0446 10/16/20 0437  NA 135   < > 131*   < > 136 136 135  K 3.7   < > 5.3*   < > 3.9 3.6 3.7  CL 93*   < > 92*   < > 88* 88* 88*  CO2 33*   < > 31   < > 37* 40* 39*  GLUCOSE 159*   < > 217*   < > 114* 94 115*  BUN 18   < > 18   < > 27* 22 18  CREATININE 0.65   < > 0.67   < > 0.98 0.69 0.82  CALCIUM 9.4   < > 9.1   < > 9.3 8.9 9.4  PROT 7.1  --  5.9*  --   --   --   --   ALBUMIN 4.1  --  3.5  --   --   --   --  AST 34  --  36  --   --   --   --   ALT 68*  --  62*  --   --   --   --   ALKPHOS 71  --  64  --   --   --   --   BILITOT 1.4*  --  0.9  --   --   --   --   GFRNONAA >60   < > >60   < > >60 >60 >60  ANIONGAP 9   < > 8   < > 11 8 8    < > = values in this interval not displayed.     Hematology Recent Labs  Lab 10/10/20 1547 10/11/20 0511  WBC 10.1 8.3  RBC 4.77 4.38  HGB 16.4 15.1  HCT 48.9 45.2  MCV 102.5* 103.2*  MCH 34.4* 34.5*  MCHC 33.5 33.4  RDW 13.0 12.8  PLT 253 210    BNP Recent Labs  Lab 10/10/20 1547 10/15/20 0446 10/16/20 0437  BNP 973.5* 1,151.4* 923.4*     DDimer No results for input(s): DDIMER in the last 168 hours.   Radiology    No results found.  Cardiac Studies   2D echo 06/08/2020: 1. Left ventricular ejection fraction, by estimation, is 20 to 25%. The  left ventricle has severely decreased function. The left ventricle  demonstrates global hypokinesis. Left ventricular diastolic function could  not be evaluated.  2. Right ventricular systolic function is normal. The right ventricular  size is mildly enlarged.  3. The mitral valve was not well visualized. No evidence of mitral valve  regurgitation.  4. The aortic  valve was not well visualized. Aortic valve regurgitation  not well assessed.  5. The inferior vena cava is dilated in size with >50% respiratory  variability, suggesting right atrial pressure of 8 mmHg.  __________  [facility] 06/10/2020: Conclusions: 1. Mild to moderate, nonobstructive coronary artery disease with up to 50% stenoses involving small D1 branch as well as mid RCA. Systolic dysfunction is out of proportion to CAD, consistent with nonischemic cardiomyopathy. 2. Mildly elevated left and right heart filling pressures. 3. Low normal to mildly reduced Fick cardiac output.  Recommendations: 1. Optimize goal-directed medical therapy for management of nonischemic cardiomyopathy. Smoking, tobacco, and drug cessation encouraged, as cardiomyopathy certainly could be related to substance abuse. 2. Medical therapy and risk factor modification to prevent progression of coronary artery disease. __________  2D echo 10/06/2020: 1. Left ventricular ejection fraction, by estimation, is 20 to 25%. The  left ventricle has severely decreased function. The left ventricle has no  regional wall motion abnormalities. The left ventricular internal cavity  size was moderately dilated. Left  ventricular diastolic parameters were normal.  2. Right ventricular systolic function is moderately reduced. The right  ventricular size is normal. Tricuspid regurgitation signal is inadequate  for assessing PA pressure.  3. Mild mitral valve regurgitation.  4. The inferior vena cava is dilated in size with >50% respiratory  variability, suggesting right atrial pressure of 8 mmHg.  Patient Profile     64 y.o. male with history of BiV failure secondary to nonischemic cardiomyopathy, and polysubstance use including cocaine, alcohol, and tobacco use, COPD, chronic low back pain on narcotic medication, hypertension, family history of CAD with brother deceased from MI in his 45s and mother deceased in her 72s  from possible heart failure, and who is being seen today for acute exacerbation of HFrEF.  Assessment &  Plan    Acute on chronic HFrEF with RV dysfunction secondary to nonischemic cardiomyopathy - Creatinine continues to be stable with good diuresis.  - continue lasix q 8 hours - continue losartan - increase coreg to 6.25mg  BID - outpatient discussion re: entresto/spiro - outpatient follow up for repeat TTE.   Palpitations/atrial tachycardia --increase coreg  For questions or updates, please contact [facility] Please consult [email or web address] for contact info under      Signed, Lanier Prude, MD  10/16/2020, 10:40 AM

## 2020-10-17 DIAGNOSIS — Z515 Encounter for palliative care: Secondary | ICD-10-CM

## 2020-10-17 DIAGNOSIS — I5023 Acute on chronic systolic (congestive) heart failure: Secondary | ICD-10-CM | POA: Diagnosis not present

## 2020-10-17 LAB — BASIC METABOLIC PANEL
Anion gap: 10 (ref 5–15)
BUN: 16 mg/dL (ref 8–23)
CO2: 35 mmol/L — ABNORMAL HIGH (ref 22–32)
Calcium: 10.1 mg/dL (ref 8.9–10.3)
Chloride: 88 mmol/L — ABNORMAL LOW (ref 98–111)
Creatinine, Ser: 0.91 mg/dL (ref 0.61–1.24)
GFR, Estimated: >60 mL/min (ref >60)
Glucose, Bld: 102 mg/dL — ABNORMAL HIGH (ref 70–99)
Potassium: 3.1 mmol/L — ABNORMAL LOW (ref 3.5–5.1)
Sodium: 133 mmol/L — ABNORMAL LOW (ref 135–145)

## 2020-10-17 MED ORDER — POTASSIUM CHLORIDE 10 MEQ/100ML IV SOLN
10.0000 meq | INTRAVENOUS | Status: DC
  Administered 2020-10-17: 10 meq via INTRAVENOUS
  Filled 2020-10-17 (×4): qty 100

## 2020-10-17 MED ORDER — POTASSIUM CHLORIDE CRYS ER 20 MEQ PO TBCR
40.0000 meq | EXTENDED_RELEASE_TABLET | Freq: Once | ORAL | Status: AC
  Administered 2020-10-17: 40 meq via ORAL
  Filled 2020-10-17: qty 2

## 2020-10-17 MED ORDER — CARVEDILOL 6.25 MG PO TABS: 6.2500 mg | ORAL_TABLET | Freq: Two times a day (BID) | ORAL | 1 refills | Status: DC

## 2020-10-17 MED ORDER — FUROSEMIDE 40 MG PO TABS: 40.0000 mg | ORAL_TABLET | Freq: Two times a day (BID) | ORAL | 0 refills | Status: DC

## 2020-10-17 MED ORDER — LOSARTAN POTASSIUM 25 MG PO TABS: 25.0000 mg | ORAL_TABLET | Freq: Every day | ORAL | 0 refills | Status: DC

## 2020-10-17 MED ORDER — ASPIRIN 81 MG PO TBEC: 81.0000 mg | DELAYED_RELEASE_TABLET | Freq: Every day | ORAL | 11 refills | Status: AC

## 2020-10-17 MED ORDER — POTASSIUM CHLORIDE CRYS ER 20 MEQ PO TBCR: 20.0000 meq | EXTENDED_RELEASE_TABLET | Freq: Two times a day (BID) | ORAL | 0 refills | Status: DC

## 2020-10-17 MED ORDER — ATORVASTATIN CALCIUM 20 MG PO TABS
20.0000 mg | ORAL_TABLET | Freq: Every day | ORAL | 0 refills | Status: DC
Start: 2020-10-17 — End: 2020-10-25

## 2020-10-17 NOTE — TOC Transition Note (Signed)
Transition of Care [facility]) - CM/SW Discharge Note   Patient Details  Name: Kurt Blankenship MRN: [id number] Date of Birth: 1956-10-02  [facility] CM/SW Contact:  Margarito Liner, LCSW Phone Number: 10/17/2020, 12:40 PM   Clinical Narrative: Patient has orders to discharge home with hospice today. No further concerns. CSW signing off.    Final next level of care: Home w Hospice Care Barriers to Discharge: Barriers Resolved   Patient Goals and CMS Choice     Choice offered to / list presented to : Patient  Discharge Placement                Patient to be transferred to facility by: Cousin will take him home.   Patient and family notified of of transfer: 10/17/20  Discharge Plan and Services     Post Acute Care Choice: Hospice                               Social Determinants of Health (SDOH) Interventions     Readmission Risk Interventions Readmission Risk Prevention Plan 10/12/2020  HRI or Home Care Consult (No Data)  Palliative Care Screening Complete  Medication Review (RN Care Manager) Complete  Some recent data might be hidden

## 2020-10-17 NOTE — Discharge Summary (Signed)
Kurt Blankenship GNF:[id number] DOB: March 16, 1956 DOA: 10/10/2020  PCP: System, Provider Not In  Admit date: 10/10/2020 Discharge date: 10/17/2020  Admitted From: home Disposition:  home  Recommendations for Outpatient Follow-up:  1. Follow up with PCP in 1 week 2. Please obtain BMP/CBC in one week 3. Cardiology in one week  Home Health:hospice    Discharge Condition:Stable CODE STATUS:DNR  Diet recommendation: Heart Healthy Brief/Interim Summary: Kurt Blankenship a 64 y.o.malewith medical history significant forchronic systolic heart failure with most recent echocardiogram in October 2021 showing LVEF 20 to 25%, hypertension, COPD, chronic tobacco abuse, chronic pain syndrome,who is admitted to [facility] on 10/25/2021with acute hypoxic respiratory failure in the setting of acute on chronic systolic heart failure as well as acute COPD exacerbation.  In the setting of the symptoms, the patient was admitted to [facility] on 10/05/2020 for further evaluation management of acute on chronic systolic heart failure as well as acute COPD exacerbation.  During this previous hospitalization he was treated with IV diuretics as well as systemic corticosteroids.  With these measures, the patient acknowledges that his symptoms began to improve, although he ultimately left the hospital AGAINST MEDICAL ADVICE on 10/08/2020.  In the interval since leaving AMA, the patient reports worsening of his shortness of breath prompting him to return to [facility] emergency .  He was admitted to the hospital service.  Cardiology was consulted.  He was started on IV steroids for COPD exacerbation and IV Lasix for acute on chronic systolic heart failure.   Acute on chronic systolic CHF (congestive heart failure) (HCC): -2D echo on 6/20//21and 10/21/21showed EF of 20-25%.  Was treated with IV Lasix.  Weight is at his baseline weight  today. Clinically improved. Cardiology was following was okay for patient to be discharged with follow-up with heart failure clinic and cardiology in 1 week to also have labs done then.    COPD exacerbation (HCC): Improved. No further exacerbation Iv steorid was d/c'd Noncompliant with [place] oxygen despite counseling on importance of wearing it when 02 sat drops  Has oxygen at home, but not compliant.    Elevated troponin:Troponin41-->41.  Due to demand ischemia, no chest pain Continue with beta blk/statin/asa    Hyperkalemia- K was 5.6, given dose of Kayexalate, with improvement. Remained stable.  Since on lasix will add K-Dur bid.  Bmp in one week  HTN (hypertension) Stable , continue with Coreg, losartan     HLD (hyperlipidemia):  Continue with statins   h/oTobacco abuse and Alcohol abuse: -Was counseled on quitting   Hyperglycemia suspect steroid induced -A1c 5.6 Improved  palliative care consultation.home with hospice    Discharge Diagnoses:  Principal Problem:   Acute on chronic systolic CHF (congestive heart failure) (HCC) Active Problems:   Hypertension   COPD exacerbation (HCC)   HLD (hyperlipidemia)   Tobacco abuse   SOB (shortness of breath)   Acute respiratory failure with hypoxia (HCC)   Acute on chronic respiratory failure with hypoxia and hypercapnia (HCC)    Discharge Instructions  Discharge Instructions    Call MD for:  difficulty breathing, headache or visual disturbances   Complete by: As directed    Diet - low sodium heart healthy   Complete by: As directed    Discharge instructions   Complete by: As directed    Follow up  with cardiology in one week, need blood work Take all your medicines Stop smoking Wear your oxygen Weight daily, if  weight up by 3 lbs call cardiology   Increase activity slowly   Complete by: As directed      Allergies as of 10/17/2020   No Known Allergies     Medication  List    TAKE these medications   albuterol 108 (90 Base) MCG/ACT inhaler Commonly known as: VENTOLIN HFA Inhale 2 puffs into the lungs every 4 (four) hours as needed for wheezing or shortness of breath.   aspirin 81 MG EC tablet Take 1 tablet (81 mg total) by mouth daily. Swallow whole. Start taking on: October 18, 2020   atorvastatin 20 MG tablet Commonly known as: LIPITOR Take 1 tablet (20 mg total) by mouth at bedtime.   budesonide-formoterol 160-4.5 MCG/ACT inhaler Commonly known as: SYMBICORT Inhale 2 puffs into the lungs 2 (two) times daily.   carvedilol 6.25 MG tablet Commonly known as: COREG Take 1 tablet (6.25 mg total) by mouth 2 (two) times daily with a meal. What changed:   medication strength  how much to take   furosemide 40 MG tablet Commonly known as: LASIX Take 1 tablet (40 mg total) by mouth in the morning and at bedtime. What changed: when to take this   gabapentin 300 MG capsule Commonly known as: NEURONTIN Take 300 mg by mouth in the morning and at bedtime.   losartan 25 MG tablet Commonly known as: COZAAR Take 1 tablet (25 mg total) by mouth daily.   meclizine 12.5 MG tablet Commonly known as: ANTIVERT Take 12.5 mg by mouth 3 (three) times daily as needed for dizziness.   Mitigare 0.6 MG Caps Generic drug: Colchicine Take 0.6 mg by mouth daily as needed.   Oxycodone HCl 10 MG Tabs Take 10 mg by mouth every 6 (six) hours. Up to 5 times per day prn for pain   potassium chloride SA 20 MEQ tablet Commonly known as: KLOR-CON Take 1 tablet (20 mEq total) by mouth 2 (two) times daily. What changed: when to take this   SUMAtriptan 50 MG tablet Commonly known as: IMITREX Take 50 mg by mouth every 2 (two) hours as needed.   traZODone 50 MG tablet Commonly known as: DESYREL Take 50 mg by mouth at bedtime.       Follow-up Information    [facility]. Go on 10/18/2020.   Specialty: Cardiology Why:  at 8:30am. Enter through the [facility] Mall entrance Contact information: [address] [place] [address] [phone]       Yvonne Kendall, MD. Go on 10/25/2020.   Specialty: Cardiology Why: 9:30am appointment Contact information: [address] [place] [address] [phone]              No Known Allergies  Consultations:  Cardiology and palliative care   Procedures/Studies: DG Chest 2 View  Result Date: 10/05/2020 CLINICAL DATA:  Shortness of breath. EXAM: CHEST - 2 VIEW COMPARISON:  May 07, 2020 FINDINGS: Borderline enlargement the cardiac silhouette. New diffuse interstitial opacities with patchy airspace opacities, greatest at the right lung base. Suspect small bilateral pleural effusions with fluid tracking along the right major fissure. No discernible pneumothorax. No acute osseous abnormality. IMPRESSION: 1. New diffuse interstitial opacities with patchy airspace opacities, compatible with edema and/or infection. 2. Suspect small bilateral pleural effusions with fluid tracking along the right major fissure. 3. Borderline cardiomegaly. Electronically Signed   By: Feliberto Harts MD   On: 10/05/2020 13:01   DG Chest Portable 1  View  Result Date: 10/10/2020 CLINICAL DATA:  Shortness of breath EXAM: PORTABLE CHEST 1 VIEW COMPARISON:  10/05/2020, CT 12/09/2013 FINDINGS: Incomplete inclusion of the lung bases. Enlarged cardiomediastinal silhouette with vascular calcification. Increased airspace disease at both bases with probable right pleural effusion. Aortic atherosclerosis. No pneumothorax. Loose body or calcified node at the left axilla. IMPRESSION: 1. Cardiomegaly with vascular congestion. 2. Increased airspace disease at both bases with probable right pleural effusion. Electronically Signed   By: Jasmine Pang M.D.   On: 10/10/2020 16:08   DG Abd Portable 1V  Result Date: 10/06/2020 CLINICAL DATA:  Abdominal pain.  EXAM: PORTABLE ABDOMEN - 1 VIEW COMPARISON:  09/24/2004 CT abdomen pelvis. FINDINGS: The bowel gas pattern is normal. Mild stool burden. No radio-opaque calculi. Lumbar spondylosis. Findings above the diaphragm are better characterized on recent chest radiograph. IMPRESSION: Nonobstructive bowel gas pattern.  Mild stool burden. Electronically Signed   By: Stana Bunting M.D.   On: 10/06/2020 11:27   ECHOCARDIOGRAM COMPLETE  Result Date: 10/06/2020    ECHOCARDIOGRAM REPORT   Patient Name:   Kurt Blankenship Date of Exam: 10/06/2020 Medical Rec #:  [id number]      Height:       69.0 in Accession #:    [id number]     Weight:       187.0 lb Date of Birth:  1956-02-24       BSA:          2.008 m Patient Age:    64 years       BP:           104/87 mmHg Patient Gender: M              HR:           89 bpm. Exam Location:  [facility] Procedure: 2D Echo, Color Doppler, Cardiac Doppler and Intracardiac            Opacification Agent Indications:     I50.21 CHF-Acute Systolic  History:         Patient has prior history of Echocardiogram examinations, most                  recent 06/08/2020. CHF, COPD; Risk Factors:Current Smoker and                  Hypertension.  Sonographer:     Humphrey Rolls RDCS (AE) Referring Phys:  [id number] Brien Few NIU Diagnosing Phys: Julien Nordmann MD  Sonographer Comments: Suboptimal parasternal window. Image acquisition challenging due to COPD. IMPRESSIONS  1. Left ventricular ejection fraction, by estimation, is 20 to 25%. The left ventricle has severely decreased function. The left ventricle has no regional wall motion abnormalities. The left ventricular internal cavity size was moderately dilated. Left ventricular diastolic parameters were normal.  2. Right ventricular systolic function is moderately reduced. The right ventricular size is normal. Tricuspid regurgitation signal is inadequate for assessing PA pressure.  3. Mild mitral valve regurgitation.  4. The inferior vena cava is dilated in size with >50%  respiratory variability, suggesting right atrial pressure of 8 mmHg. FINDINGS  Left Ventricle: Left ventricular ejection fraction, by estimation, is 20 to 25%. The left ventricle has severely decreased function. The left ventricle has no regional wall motion abnormalities. Definity contrast agent was given IV to delineate the left  ventricular endocardial borders. The left ventricular internal cavity size was moderately dilated. There is no left ventricular hypertrophy. Left ventricular diastolic parameters were normal. Right Ventricle:  The right ventricular size is normal. No increase in right ventricular wall thickness. Right ventricular systolic function is moderately reduced. Tricuspid regurgitation signal is inadequate for assessing PA pressure. Left Atrium: Left atrial size was normal in size. Right Atrium: Right atrial size was normal in size. Pericardium: There is no evidence of pericardial effusion. Mitral Valve: The mitral valve is normal in structure. Mild mitral valve regurgitation. No evidence of mitral valve stenosis. MV peak gradient, 2.6 mmHg. The mean mitral valve gradient is 1.0 mmHg. Tricuspid Valve: The tricuspid valve is normal in structure. Tricuspid valve regurgitation is mild . No evidence of tricuspid stenosis. Aortic Valve: The aortic valve is normal in structure. Aortic valve regurgitation is not visualized. No aortic stenosis is present. Aortic valve mean gradient measures 1.0 mmHg. Aortic valve peak gradient measures 2.2 mmHg. Aortic valve area, by VTI measures 2.77 cm. Pulmonic Valve: The pulmonic valve was normal in structure. Pulmonic valve regurgitation is not visualized. No evidence of pulmonic stenosis. Aorta: The aortic root is normal in size and structure. Venous: The inferior vena cava is dilated in size with greater than 50% respiratory variability, suggesting right atrial pressure of 8 mmHg. IAS/Shunts: No atrial level shunt detected by color flow Doppler.  LEFT VENTRICLE  PLAX 2D LVIDd:         5.87 cm  Diastology LVIDs:         5.29 cm  LV e' medial:    6.53 cm/s LV PW:         1.01 cm  LV E/e' medial:  12.7 LV IVS:        0.86 cm  LV e' lateral:   10.20 cm/s LVOT diam:     2.30 cm  LV E/e' lateral: 8.1 LV SV:         32 LV SV Index:   16 LVOT Area:     4.15 cm  RIGHT VENTRICLE RV Basal diam:  3.38 cm LEFT ATRIUM             Index       RIGHT ATRIUM           Index LA diam:        4.10 cm 2.04 cm/m  RA Area:     18.10 cm LA Vol (A2C):   46.7 ml 23.26 ml/m RA Volume:   44.20 ml  22.01 ml/m LA Vol (A4C):   64.5 ml 32.12 ml/m LA Biplane Vol: 60.1 ml 29.93 ml/m  AORTIC VALVE AV Area (Vmax):    2.96 cm AV Area (Vmean):   2.85 cm AV Area (VTI):     2.77 cm AV Vmax:           75.00 cm/s AV Vmean:          53.500 cm/s AV VTI:            0.117 m AV Peak Grad:      2.2 mmHg AV Mean Grad:      1.0 mmHg LVOT Vmax:         53.50 cm/s LVOT Vmean:        36.700 cm/s LVOT VTI:          0.078 m LVOT/AV VTI ratio: 0.67  AORTA Ao Root diam: 3.60 cm MITRAL VALVE MV Area (PHT): 5.90 cm    SHUNTS MV Peak grad:  2.6 mmHg    Systemic VTI:  0.08 m MV Mean grad:  1.0 mmHg    Systemic Diam: 2.30 cm MV Vmax:  0.80 m/s MV Vmean:      49.4 cm/s MV Decel Time: 129 msec MV E velocity: 82.90 cm/s MV A velocity: 45.20 cm/s MV E/A ratio:  1.83 Julien Nordmann MD Electronically signed by Julien Nordmann MD Signature Date/Time: 10/06/2020/5:20:12 PM    Final        Subjective: Feels better. At baseline weight  Per pt .   Discharge Exam: Vitals:   10/17/20 0805 10/17/20 0917  BP:  96/67  Pulse:    Resp:    Temp:    SpO2: 97% 97%   Vitals:   10/17/20 0516 10/17/20 0800 10/17/20 0805 10/17/20 0917  BP:  94/69  96/67  Pulse:  87    Resp:  16    Temp:  98.6 F (37 C)    TempSrc:  Oral    SpO2:  (!) 82% 97% 97%  Weight: 76.4 kg     Height:        General: Pt is alert, awake, not in acute distress Cardiovascular: RRR, S1/S2 +, no rubs, no gallops Respiratory: CTA bilaterally, no  wheezing, no rhonchi Abdominal: Soft, NT, ND, bowel sounds + Extremities: no edema, no cyanosis    The results of significant diagnostics from this hospitalization (including imaging, microbiology, ancillary and laboratory) are listed below for reference.     Microbiology: Recent Results (from the past 240 hour(s))  Respiratory Panel by RT PCR (Flu A&B, Covid) - Nasopharyngeal Swab     Status: None   Collection Time: 10/10/20  4:24 PM   Specimen: Nasopharyngeal Swab  Result Value Ref Range Status   SARS Coronavirus 2 by RT PCR NEGATIVE NEGATIVE Final    Comment: (NOTE) SARS-CoV-2 target nucleic acids are NOT DETECTED.  The SARS-CoV-2 RNA is generally detectable in upper respiratoy specimens during the acute phase of infection. The lowest concentration of SARS-CoV-2 viral copies this assay can detect is 131 copies/mL. A negative result does not preclude SARS-Cov-2 infection and should not be used as the sole basis for treatment or other patient management decisions. A negative result may occur with  improper specimen collection/handling, submission of specimen other than nasopharyngeal swab, presence of viral mutation(s) within the areas targeted by this assay, and inadequate number of viral copies (<131 copies/mL). A negative result must be combined with clinical observations, patient history, and epidemiological information. The expected result is Negative.  Fact Sheet for Patients:  https://www.moore.com/  Fact Sheet for Healthcare Providers:  https://www.young.biz/  This test is no t yet approved or cleared by the [place] FDA and  has been authorized for detection and/or diagnosis of SARS-CoV-2 by FDA under an Emergency Use Authorization (EUA). This EUA will remain  in effect (meaning this test can be used) for the duration of the COVID-19 declaration under Section 564(b)(1) of the Act, 21 U.S.C. section 360bbb-3(b)(1), unless  the authorization is terminated or revoked sooner.     Influenza A by PCR NEGATIVE NEGATIVE Final   Influenza B by PCR NEGATIVE NEGATIVE Final    Comment: (NOTE) The Xpert Xpress SARS-CoV-2/FLU/RSV assay is intended as an aid in  the diagnosis of influenza from Nasopharyngeal swab specimens and  should not be used as a sole basis for treatment. Nasal washings and  aspirates are unacceptable for Xpert Xpress SARS-CoV-2/FLU/RSV  testing.  Fact Sheet for Patients: https://www.moore.com/  Fact Sheet for Healthcare Providers: https://www.young.biz/  This test is not yet approved or cleared by the [place] FDA and  has been authorized for detection and/or diagnosis  of SARS-CoV-2 by  FDA under an Emergency Use Authorization (EUA). This EUA will remain  in effect (meaning this test can be used) for the duration of the  Covid-19 declaration under Section 564(b)(1) of the Act, 21  U.S.C. section 360bbb-3(b)(1), unless the authorization is  terminated or revoked. Performed at [facility], [address] [place], [place] [address]      Labs: BNP (last 3 results) Recent Labs    10/10/20 1547 10/15/20 0446 10/16/20 0437  BNP 973.5* 1,151.4* 923.4*   Basic Metabolic Panel: Recent Labs  Lab 10/10/20 1547 10/10/20 1547 10/11/20 0511 10/12/20 0900 10/13/20 0609 10/14/20 0547 10/15/20 0446 10/16/20 0437 10/17/20 0842  NA 135   < > 131*   < > 136 136 136 135 133*  K 3.7   < > 5.3*   < > 4.7 3.9 3.6 3.7 3.1*  CL 93*   < > 92*   < > 91* 88* 88* 88* 88*  CO2 33*   < > 31   < > 36* 37* 40* 39* 35*  GLUCOSE 159*   < > 217*   < > 86 114* 94 115* 102*  BUN 18   < > 18   < > 27* 27* CREATININE 0.65   < > 0.67   < > 0.91 0.98 0.69 0.82 0.91  CALCIUM 9.4   < > 9.1   < > 9.7 9.3 8.9 9.4 10.1  MG 2.1  --  2.0  --   --   --   --   --   --   PHOS  --   --  4.0  --   --   --   --   --   --    < > = values in this interval not  displayed.   Liver Function Tests: Recent Labs  Lab 10/10/20 1547 10/11/20 0511  AST 34 36  ALT 68* 62*  ALKPHOS 71 64  BILITOT 1.4* 0.9  PROT 7.1 5.9*  ALBUMIN 4.1 3.5   No results for input(s): LIPASE, AMYLASE in the last 168 hours. No results for input(s): AMMONIA in the last 168 hours. CBC: Recent Labs  Lab 10/10/20 1547 10/11/20 0511  WBC 10.1 8.3  NEUTROABS 7.2  --   HGB 16.4 15.1  HCT 48.9 45.2  MCV 102.5* 103.2*  PLT 253 210   Cardiac Enzymes: No results for input(s): CKTOTAL, CKMB, CKMBINDEX, TROPONINI in the last 168 hours. BNP: Invalid input(s): POCBNP CBG: No results for input(s): GLUCAP in the last 168 hours. D-Dimer No results for input(s): DDIMER in the last 72 hours. Hgb A1c No results for input(s): HGBA1C in the last 72 hours. Lipid Profile No results for input(s): CHOL, HDL, LDLCALC, TRIG, CHOLHDL, LDLDIRECT in the last 72 hours. Thyroid function studies No results for input(s): TSH, T4TOTAL, T3FREE, THYROIDAB in the last 72 hours.  Invalid input(s): FREET3 Anemia work up No results for input(s): VITAMINB12, FOLATE, FERRITIN, TIBC, IRON, RETICCTPCT in the last 72 hours. Urinalysis    Component Value Date/Time   COLORURINE AMBER (A) 02/25/2019 1203   APPEARANCEUR CLEAR (A) 02/25/2019 1203   APPEARANCEUR Clear 11/09/2012 1801   LABSPEC 1.029 02/25/2019 1203   LABSPEC 1.005 11/09/2012 1801   PHURINE 6.0 02/25/2019 1203   GLUCOSEU NEGATIVE 02/25/2019 1203   GLUCOSEU Negative 11/09/2012 1801   HGBUR NEGATIVE 02/25/2019 1203   BILIRUBINUR NEGATIVE 02/25/2019 1203   BILIRUBINUR Negative 11/09/2012 1801   KETONESUR 20 (  A) 02/25/2019 1203   PROTEINUR 100 (A) 02/25/2019 1203   NITRITE NEGATIVE 02/25/2019 1203   LEUKOCYTESUR NEGATIVE 02/25/2019 1203   LEUKOCYTESUR Negative 11/09/2012 1801   Sepsis Labs Invalid input(s): PROCALCITONIN,  WBC,  LACTICIDVEN Microbiology Recent Results (from the past 240 hour(s))  Respiratory Panel by RT PCR  (Flu A&B, Covid) - Nasopharyngeal Swab     Status: None   Collection Time: 10/10/20  4:24 PM   Specimen: Nasopharyngeal Swab  Result Value Ref Range Status   SARS Coronavirus 2 by RT PCR NEGATIVE NEGATIVE Final    Comment: (NOTE) SARS-CoV-2 target nucleic acids are NOT DETECTED.  The SARS-CoV-2 RNA is generally detectable in upper respiratoy specimens during the acute phase of infection. The lowest concentration of SARS-CoV-2 viral copies this assay can detect is 131 copies/mL. A negative result does not preclude SARS-Cov-2 infection and should not be used as the sole basis for treatment or other patient management decisions. A negative result may occur with  improper specimen collection/handling, submission of specimen other than nasopharyngeal swab, presence of viral mutation(s) within the areas targeted by this assay, and inadequate number of viral copies (<131 copies/mL). A negative result must be combined with clinical observations, patient history, and epidemiological information. The expected result is Negative.  Fact Sheet for Patients:  https://www.moore.com/  Fact Sheet for Healthcare Providers:  https://www.young.biz/  This test is no t yet approved or cleared by the [place] FDA and  has been authorized for detection and/or diagnosis of SARS-CoV-2 by FDA under an Emergency Use Authorization (EUA). This EUA will remain  in effect (meaning this test can be used) for the duration of the COVID-19 declaration under Section 564(b)(1) of the Act, 21 U.S.C. section 360bbb-3(b)(1), unless the authorization is terminated or revoked sooner.     Influenza A by PCR NEGATIVE NEGATIVE Final   Influenza B by PCR NEGATIVE NEGATIVE Final    Comment: (NOTE) The Xpert Xpress SARS-CoV-2/FLU/RSV assay is intended as an aid in  the diagnosis of influenza from Nasopharyngeal swab specimens and  should not be used as a sole basis for treatment.  Nasal washings and  aspirates are unacceptable for Xpert Xpress SARS-CoV-2/FLU/RSV  testing.  Fact Sheet for Patients: https://www.moore.com/  Fact Sheet for Healthcare Providers: https://www.young.biz/  This test is not yet approved or cleared by the [place] FDA and  has been authorized for detection and/or diagnosis of SARS-CoV-2 by  FDA under an Emergency Use Authorization (EUA). This EUA will remain  in effect (meaning this test can be used) for the duration of the  Covid-19 declaration under Section 564(b)(1) of the Act, 21  U.S.C. section 360bbb-3(b)(1), unless the authorization is  terminated or revoked. Performed at [facility], [address] [place], [place] [address]      Time coordinating discharge: Over 30 minutes  SIGNED:   Lynn Ito, MD  [facility] 10/17/2020, 11:16 AM Pager   If 7PM-7AM, please contact night-coverage [email or web address] Password [email or web address]

## 2020-10-17 NOTE — Progress Notes (Addendum)
Discharge order verified- patient stable.  Throughout day not wearing oxygen, walking around unit without O2 despite RN recommendation- patient does not become short of breath.  Continues to receive oxycodone 10mg  pretty close to every 6 hrs.  RN noted none sent to pharmacy. Patient states none at home.  Notified MD of information  MD will not prescribe.  RN notifies Marylu Lund, [facility] Liaison of situation, she follows up with MD Amery, no new Rx.  Notified patient who states he might have "a little" at home, and "I'll be alright."  States he will reach out to his MD.  [employer] bobby arrives to pick up patient, has home O2 without regulator "I left it at home." Advised to return to pick up oxygen regulator.  Patient refuses and states "I ain't stayin here no longer." D/C instructions, medications, follow up appointments reviewed with patient. RN hands patient DNR and MOST form with AVS. IV removed.  Patient stable, departs via wheelchair with volunteer.  To have follow up tomorrow morning with cardiology, admission to hospice at home within next few days.

## 2020-10-17 NOTE — Care Management Important Message (Signed)
Important Message  Patient Details  Name: Kurt Blankenship MRN: [id number] Date of Birth: Nov 08, 1956   Medicare Important Message Given:  Yes     Johnell Comings 10/17/2020, 12:13 PM

## 2020-10-17 NOTE — Progress Notes (Signed)
Progress Note  Patient Name: Kurt Blankenship Date of Encounter: 10/17/2020  Primary Cardiologist: Yvonne Kendall, MD   Subjective   He feels significantly better with resolution of leg edema. His weight is down 12 kg and he is -20.5 L for the admission.  Inpatient Medications    Scheduled Meds: . aspirin EC  81 mg Oral Daily  . atorvastatin  20 mg Oral QHS  . carvedilol  6.25 mg Oral BID WC  . enoxaparin (LOVENOX) injection  40 mg Subcutaneous Q24H  . furosemide  60 mg Intravenous Q8H  . gabapentin  300 mg Oral BID  . Ipratropium-Albuterol  1 puff Inhalation Q6H WA  . losartan  25 mg Oral Daily  . nicotine  21 mg Transdermal Daily  . potassium chloride  40 mEq Oral Daily   Continuous Infusions: . potassium chloride 10 mEq (10/17/20 1016)   PRN Meds: acetaminophen **OR** acetaminophen, albuterol, ondansetron **OR** ondansetron (ZOFRAN) IV, oxyCODONE, phenol, traZODone   Vital Signs    Vitals:   10/17/20 0516 10/17/20 0800 10/17/20 0805 10/17/20 0917  BP:  94/69  96/67  Pulse:  87    Resp:  16    Temp:  98.6 F (37 C)    TempSrc:  Oral    SpO2:  (!) 82% 97% 97%  Weight: 76.4 kg     Height:        Intake/Output Summary (Last 24 hours) at 10/17/2020 1021 Last data filed at 10/17/2020 0803 Gross per 24 hour  Intake 400 ml  Output 2650 ml  Net -2250 ml   Last 3 Weights 10/17/2020 10/16/2020 10/15/2020  Weight (lbs) 168 lb 8 oz 181 lb 10.5 oz 181 lb 3.5 oz  Weight (kg) 76.431 kg 82.4 kg 82.2 kg       ECG    No new tracings- Personally Reviewed  Physical Exam   GEN: No acute distress.   Neck:  No JVD Cardiac: RRR, no murmurs, rubs, or gallops.  Respiratory:  Clear to auscultation bilaterally. GI: Soft, nontender, non-distended  MS:  Minimal bilateral leg edema. Neuro:  Nonfocal  Psych: Normal affect   Labs    High Sensitivity Troponin:   Recent Labs  Lab 10/05/20 1625 10/05/20 1933 10/06/20 0409 10/10/20 1547 10/10/20 1817  TROPONINIHS 49*  30* 40* 41* 41*      Cardiac EnzymesNo results for input(s): TROPONINI in the last 168 hours. No results for input(s): TROPIPOC in the last 168 hours.   Chemistry Recent Labs  Lab 10/10/20 1547 10/10/20 1547 10/11/20 0511 10/12/20 0900 10/15/20 0446 10/16/20 0437 10/17/20 0842  NA 135   < > 131*   < > 136 135 133*  K 3.7   < > 5.3*   < > 3.6 3.7 3.1*  CL 93*   < > 92*   < > 88* 88* 88*  CO2 33*   < > 31   < > 40* 39* 35*  GLUCOSE 159*   < > 217*   < > 94 115* 102*  BUN 18   < > 18   < > 22 18 16   CREATININE 0.65   < > 0.67   < > 0.69 0.82 0.91  CALCIUM 9.4   < > 9.1   < > 8.9 9.4 10.1  PROT 7.1  --  5.9*  --   --   --   --   ALBUMIN 4.1  --  3.5  --   --   --   --  AST 34  --  36  --   --   --   --   ALT 68*  --  62*  --   --   --   --   ALKPHOS 71  --  64  --   --   --   --   BILITOT 1.4*  --  0.9  --   --   --   --   GFRNONAA >60   < > >60   < > >60 >60 >60  ANIONGAP 9   < > 8   < > 8 8 10    < > = values in this interval not displayed.     Hematology Recent Labs  Lab 10/10/20 1547 10/11/20 0511  WBC 10.1 8.3  RBC 4.77 4.38  HGB 16.4 15.1  HCT 48.9 45.2  MCV 102.5* 103.2*  MCH 34.4* 34.5*  MCHC 33.5 33.4  RDW 13.0 12.8  PLT 253 210    BNP Recent Labs  Lab 10/10/20 1547 10/15/20 0446 10/16/20 0437  BNP 973.5* 1,151.4* 923.4*     DDimer No results for input(s): DDIMER in the last 168 hours.   Radiology    No results found.  Cardiac Studies   2D echo 06/08/2020: 1. Left ventricular ejection fraction, by estimation, is 20 to 25%. The  left ventricle has severely decreased function. The left ventricle  demonstrates global hypokinesis. Left ventricular diastolic function could  not be evaluated.  2. Right ventricular systolic function is normal. The right ventricular  size is mildly enlarged.  3. The mitral valve was not well visualized. No evidence of mitral valve  regurgitation.  4. The aortic valve was not well visualized. Aortic valve  regurgitation  not well assessed.  5. The inferior vena cava is dilated in size with >50% respiratory  variability, suggesting right atrial pressure of 8 mmHg.  __________  [facility] 06/10/2020: Conclusions: 1. Mild to moderate, nonobstructive coronary artery disease with up to 50% stenoses involving small D1 branch as well as mid RCA. Systolic dysfunction is out of proportion to CAD, consistent with nonischemic cardiomyopathy. 2. Mildly elevated left and right heart filling pressures. 3. Low normal to mildly reduced Fick cardiac output.  Recommendations: 1. Optimize goal-directed medical therapy for management of nonischemic cardiomyopathy. Smoking, tobacco, and drug cessation encouraged, as cardiomyopathy certainly could be related to substance abuse. 2. Medical therapy and risk factor modification to prevent progression of coronary artery disease. __________  2D echo 10/06/2020: 1. Left ventricular ejection fraction, by estimation, is 20 to 25%. The  left ventricle has severely decreased function. The left ventricle has no  regional wall motion abnormalities. The left ventricular internal cavity  size was moderately dilated. Left  ventricular diastolic parameters were normal.  2. Right ventricular systolic function is moderately reduced. The right  ventricular size is normal. Tricuspid regurgitation signal is inadequate  for assessing PA pressure.  3. Mild mitral valve regurgitation.  4. The inferior vena cava is dilated in size with >50% respiratory  variability, suggesting right atrial pressure of 8 mmHg.  Patient Profile     64 y.o. male with history of BiV failure secondary to nonischemic cardiomyopathy, and polysubstance use including cocaine, alcohol, and tobacco use, COPD, chronic low back pain on narcotic medication, hypertension, family history of CAD with brother deceased from MI in his 20s and mother deceased in her 47s from possible heart failure, and who is being  seen today for acute exacerbation of HFrEF.  Assessment &  Plan    Acute on chronic HFrEF with RV dysfunction secondary to nonischemic cardiomyopathy Significant improvement in volume overload.  I agree with switching to furosemide 40 mg by mouth twice daily with K-Dur 20 mEq twice daily. Continue current dose of carvedilol and losartan. Blood pressure is low below 100 systolic and thus it might be difficult to transition to Entresto and add spironolactone but this should be addressed as an outpatient. -I will arrange for follow-up in our office in 1 week with basic metabolic profile.  Palpitations/atrial tachycardia --Stable after increasing carvedilol.  For questions or updates, please contact [facility] Please consult [email or web address] for contact info under      Signed, Lorine Bears, MD  10/17/2020, 10:21 AM

## 2020-10-17 NOTE — Progress Notes (Signed)
Received notification that patient's BP was 81/56 from nursing student [facility] [employer] [facility] [employer] [facility],  RN assesses patient- large cuff was used for this BP (too large).  RN rechecks with small cuff to r/o false low.  BP 94/64.  Patient denies lightheadedness, dizziness.  Alert and oriented

## 2020-10-18 ENCOUNTER — Telehealth: Payer: Self-pay | Admitting: Family

## 2020-10-18 ENCOUNTER — Telehealth: Payer: Self-pay | Admitting: Internal Medicine

## 2020-10-18 ENCOUNTER — Ambulatory Visit: Payer: Medicare HMO | Admitting: Family

## 2020-10-18 NOTE — Telephone Encounter (Signed)
-----   Message from Iran Ouch, MD sent at 10/17/2020 10:24 AM EDT ----- This is a patient of Dr. Okey Dupre.  He is being discharged from [facility] today.  TCM follow-up with basic metabolic profile in 1 week.

## 2020-10-18 NOTE — Telephone Encounter (Signed)
Patient did not show for his [facility] appointment on 10/18/20. Will attempt to reschedule.  °

## 2020-10-18 NOTE — Telephone Encounter (Signed)
TCM....  Patient is being discharged   They saw Lalla Brothers  They are scheduled to see Gillian Shields on 11/9  They were seen for NAEO  They need to be seen within 1 week  Pt not placed on wait list   Please call

## 2020-10-19 NOTE — Telephone Encounter (Signed)
Transition Care Management Unsuccessful Follow-up Telephone Call  Date of discharge and from where:  10/17/20 [facility]  Attempts:  1st Attempt  Reason for unsuccessful TCM follow-up call:  Left voice message   Appointment made with Brunetta Genera, NP at 10/24/20 at 10:30am

## 2020-10-20 NOTE — Telephone Encounter (Signed)
Transition Care Management Unsuccessful Follow-up Telephone Call  Date of discharge and from where:  10/17/20 [facility]  Attempts:  Attempt # 2  Reason for unsuccessful TCM follow-up call:  Left voice message   Appointment made with Brunetta Genera, NP at 10/24/20 at 10:30am

## 2020-10-21 IMAGING — CT CT HEAD W/O CM
3 series · 14 of 46 positions shown, 16 images · non-contrast
Comparison: CT head and cervical spine 05/07/2020

CLINICAL DATA: Fell backwards with posterior laceration

EXAM:
CT HEAD WITHOUT CONTRAST
CT CERVICAL SPINE WITHOUT CONTRAST
TECHNIQUE: Multidetector CT imaging of the head and cervical spine was
performed following the standard protocol without intravenous
contrast. Multiplanar CT image reconstructions of the cervical spine
were also generated.

[Series 2: head wo · axial · 0.44mm/px · z∈[-114,+6]mm · 8 of 29 slices shown, 10 images]
[im 3/29  brain]
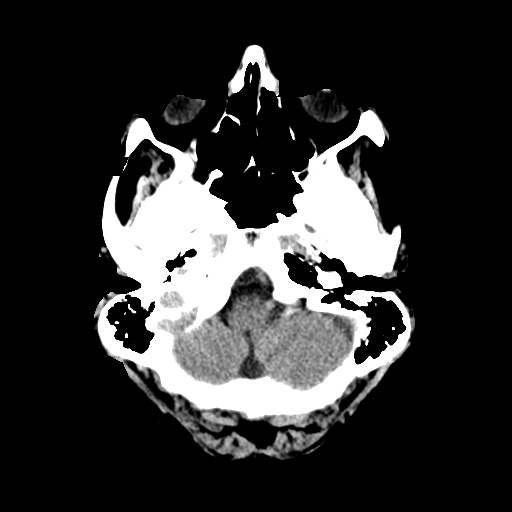
[im 3/29  bone]
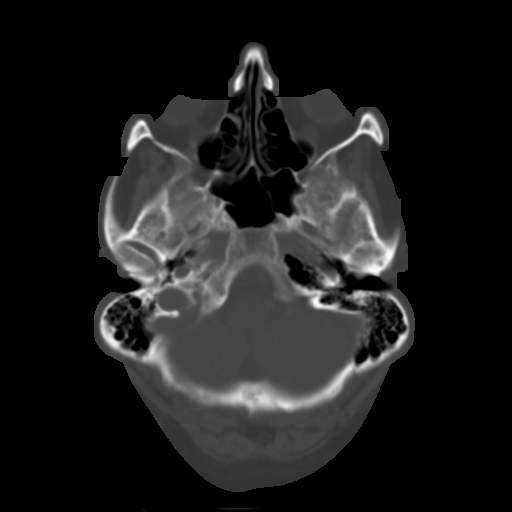
[im 7/29  brain]
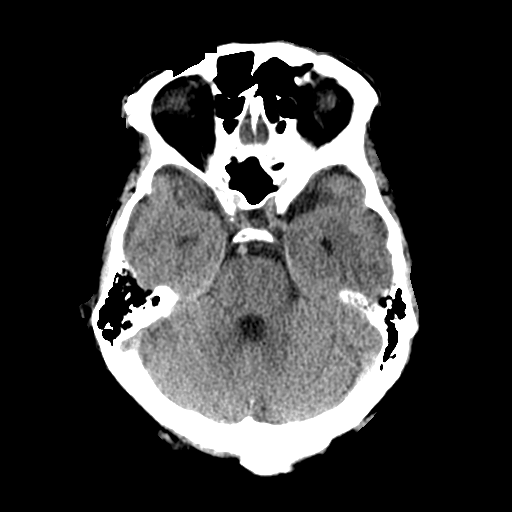
[im 10/29  brain]
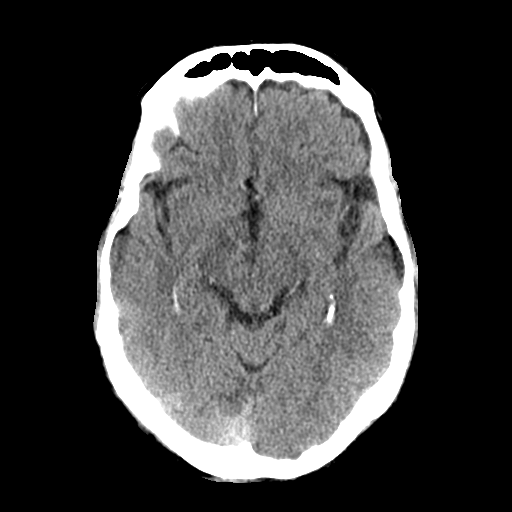
[im 13/29  brain]
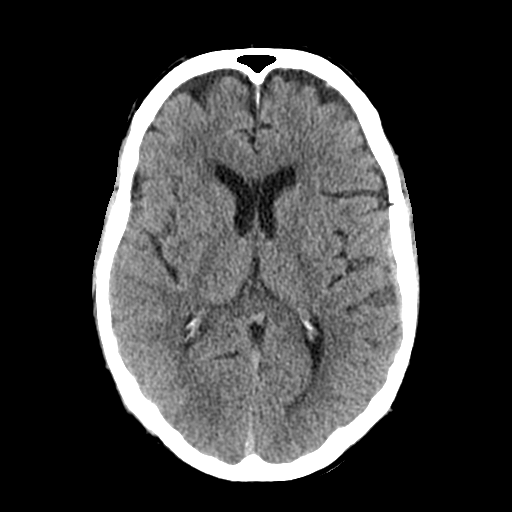
[im 17/29  brain]
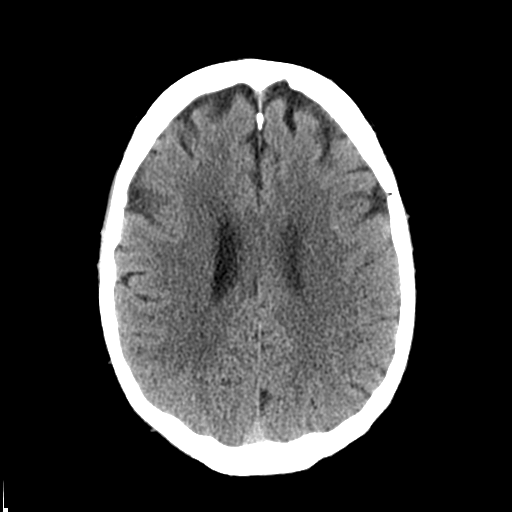
[im 17/29  bone]
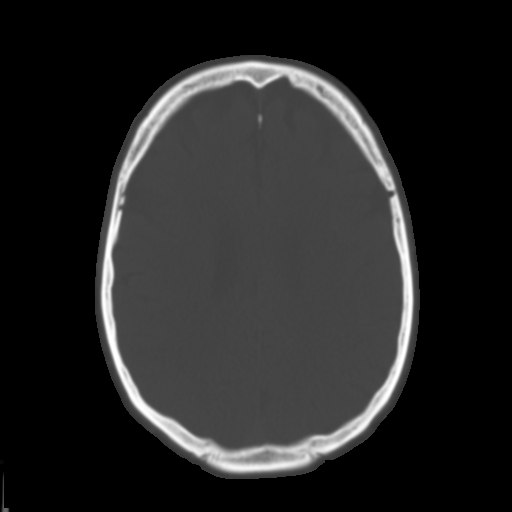
[im 20/29  brain]
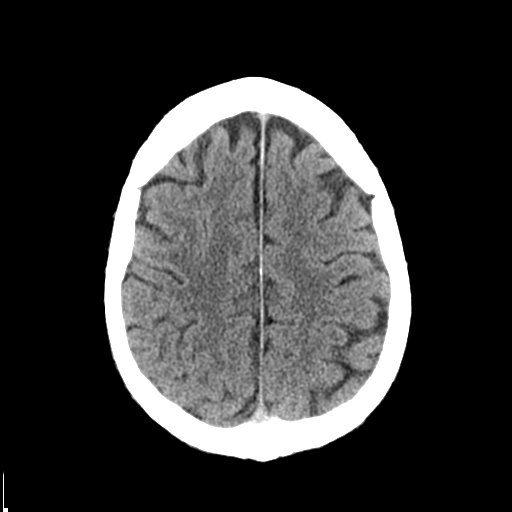
[im 23/29  brain]
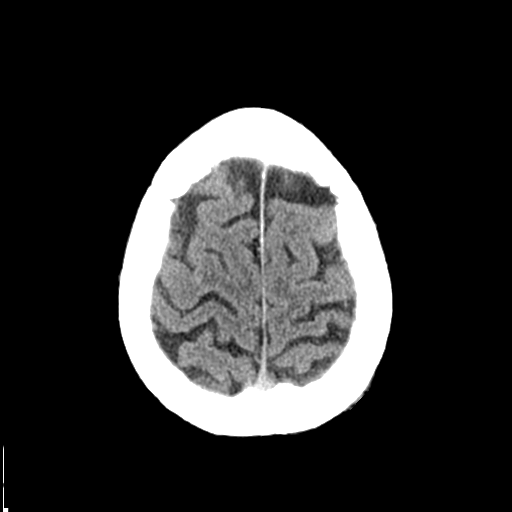
[im 27/29  brain]
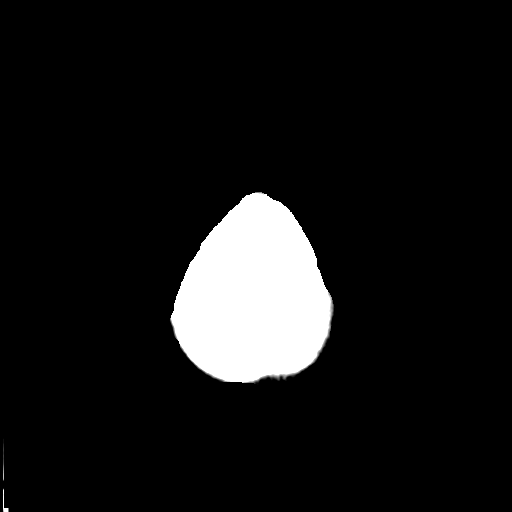

[Series 4: coronal soft tissue · coronal · 0.29mm/px · 3 of 62 slices shown]
[im 21/62  brain]
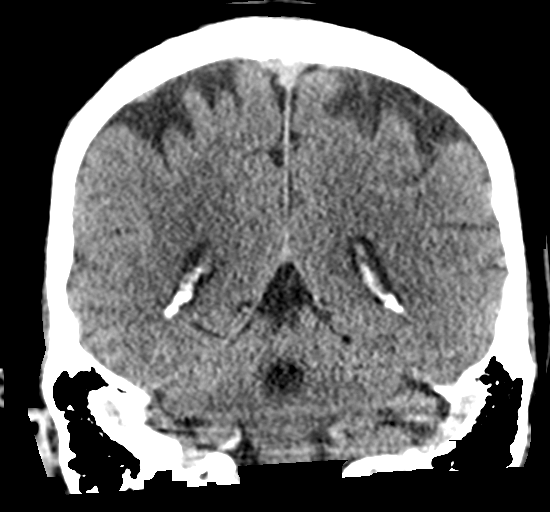
[im 28/62  brain]
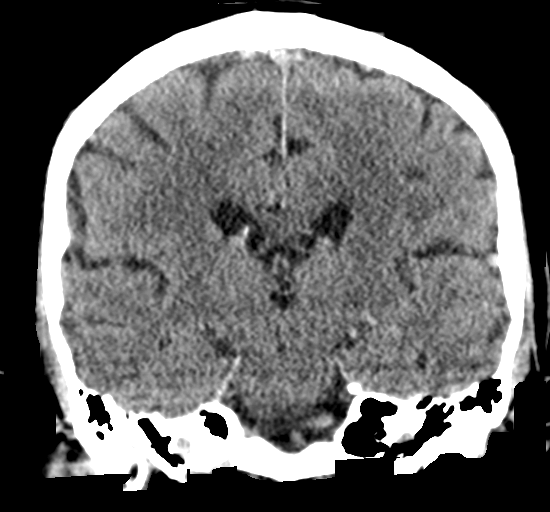
[im 34/62  brain]
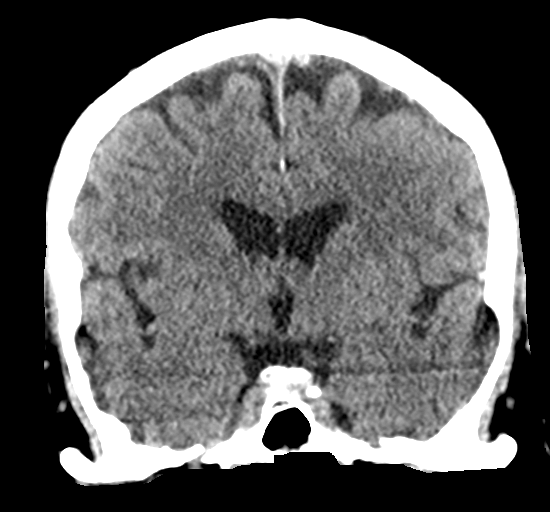

[Series 5: sagittal soft tissue · sagittal · 0.29mm/px · 3 of 50 slices shown]
[im 17/50  brain]
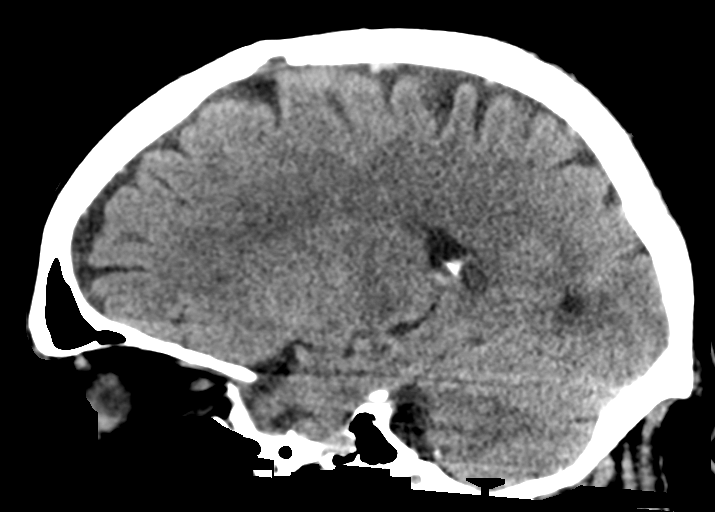
[im 25/50  brain]
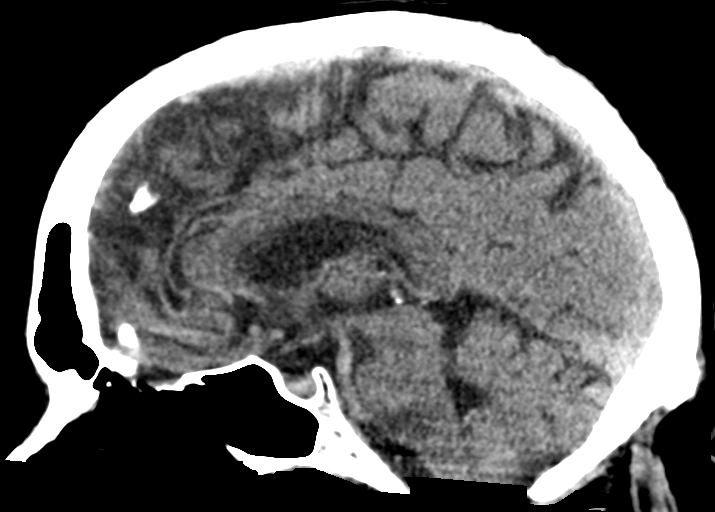
[im 33/50  brain]
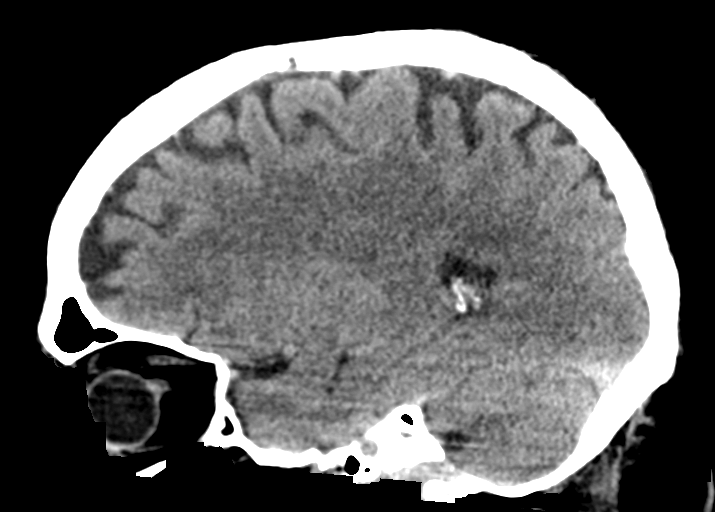

[14 of 46 positions shown; findings below may reference images not displayed]

FINDINGS: CT HEAD FINDINGS

Brain: No evidence of acute infarction, hemorrhage, hydrocephalus,
extra-axial collection or mass lesion/mass effect. Symmetric
prominence of the ventricles, cisterns and sulci compatible with
parenchymal volume loss. Patchy areas of white matter
hypoattenuation are most compatible with chronic microvascular
angiopathy.

Vascular: Atherosclerotic calcification of the carotid siphons. No
hyperdense vessel.

Skull: High left parietal scalp laceration and contusive change with
trace crescentic hematoma measuring up to 3 mm in maximal thickness
(axial image [DATE]). No subjacent calvarial fracture, sutural
diastasis or other acute traumatic osseous abnormality. No worrisome
osseous lesions. Plate screw fixation of the superolateral right
orbit without acute complication.

Sinuses/Orbits: Paranasal sinuses and mastoid air cells are
predominantly clear. Pneumatization of the petrous apices. Included
orbital structures are unremarkable.

Other: None.

CT CERVICAL SPINE FINDINGS

Alignment: Stabilization collar in place. Straightening of the
normal cervical lordosis possibly related to this device. Mild
anterolisthesis at C2-3 and C4-5 is unchanged from comparison sin
favored to be on a degenerative basis. Minimal left lateral neck
flexion. No evidence of traumatic listhesis. Bony fusion of the left
C2-3 facet. Some asymmetric degenerative changes at the left C4-5
facet resulting in some mild widening. No other abnormally widened,
perched or jumped facets. Normal alignment of the craniocervical and
atlantoaxial articulations.

Skull base and vertebrae: No acute vertebral body fracture or height
loss. No visible skull base fractures. Mild sclerosis in the dens,
likely degenerative and unchanged from prior. Stable sclerotic focus
in the T2 spinous process. Bony fusion the left C2-3 facets, as
above. Additional multilevel spondylitic changes and facet
arthropathy, further detailed below.

Soft tissues and spinal canal: No pre or paravertebral fluid or
swelling. No visible canal hematoma. Enthesopathic mineralization in
the nuchal ligament.

Disc levels: Multilevel intervertebral disc height loss with
spondylitic endplate changes. Posterior disc osteophyte complexes
efface the ventral thecal sac most pronounced at C5-6 but without
significant canal stenosis. Uncinate spurring and facet hypertrophic
changes are present throughout the cervical spine with mild
multilevel neural foraminal narrowing. More moderate to severe
foraminal stenoses present at C 5-6, C6-7 bilaterally.

Upper chest: Apical pleuroparenchymal scarring and emphysematous
changes. Cervical carotid atherosclerosis. No acute abnormality in
the upper chest or imaged lung apices. Small amount of gas in the
right jugular vein, likely related to intravenous access.

Other: No worrisome thyroid nodules.
IMPRESSION: 1. High left parietal scalp laceration and contusive change with
trace crescentic hematoma measuring up to 3 mm in maximal thickness.
No subjacent calvarial fracture, sutural diastasis or other acute
traumatic osseous abnormality.
2. No acute intracranial abnormality. Background of chronic mild
parenchymal volume loss and microvascular angiopathy.
3. No evidence of acute fracture or traumatic listhesis of the
cervical spine.
4. Bony fusion of the left C2-3 facet.
5. Multilevel spondylitic and facet arthropathy of the cervical
spine, as described above. No significant canal stenosis. Moderate
to severe bilateral foraminal narrowing C5-C7.
6. Cervical and intracranial atherosclerosis.
7. Emphysema ([id number]).

## 2020-10-21 NOTE — Telephone Encounter (Signed)
Patient contacted regarding discharge from [facility] on 10/17/20.  Patient understands to follow up with provider Dan Humphreys, NP on 10/24/20 at 10:30 am at [facility]. Patient understands discharge instructions? yes Patient understands medications and regiment? yes Patient understands to bring all medications to this visit? yes  Ask patient:  Are you enrolled in My Chart (no) at this time.

## 2020-10-24 ENCOUNTER — Ambulatory Visit (INDEPENDENT_AMBULATORY_CARE_PROVIDER_SITE_OTHER): Payer: Medicare HMO | Admitting: Family

## 2020-10-24 ENCOUNTER — Encounter: Payer: Self-pay | Admitting: Family

## 2020-10-24 ENCOUNTER — Other Ambulatory Visit: Payer: Self-pay

## 2020-10-24 VITALS — BP 84/64 | HR 72 | Ht 69.0 in | Wt 167.0 lb

## 2020-10-24 DIAGNOSIS — Z789 Other specified health status: Secondary | ICD-10-CM

## 2020-10-24 DIAGNOSIS — J449 Chronic obstructive pulmonary disease, unspecified: Secondary | ICD-10-CM

## 2020-10-24 DIAGNOSIS — R002 Palpitations: Secondary | ICD-10-CM

## 2020-10-24 DIAGNOSIS — I5042 Chronic combined systolic (congestive) and diastolic (congestive) heart failure: Secondary | ICD-10-CM | POA: Diagnosis not present

## 2020-10-24 DIAGNOSIS — Z7289 Other problems related to lifestyle: Secondary | ICD-10-CM

## 2020-10-24 DIAGNOSIS — I952 Hypotension due to drugs: Secondary | ICD-10-CM

## 2020-10-24 DIAGNOSIS — Z720 Tobacco use: Secondary | ICD-10-CM | POA: Diagnosis not present

## 2020-10-24 DIAGNOSIS — I471 Supraventricular tachycardia: Secondary | ICD-10-CM

## 2020-10-24 NOTE — Patient Instructions (Addendum)
Medication Instructions:  Your physician has recommended you make the following change in your medication:   STOP Lisinopril.  *If you need a refill on your cardiac medications before your next appointment, please call your pharmacy*   Lab Work:  Your physician recommends that you return for lab work in: TODAY - Bmet  If you have labs (blood work) drawn today and your tests are completely normal, you will receive your results only by: Marland Kitchen MyChart Message (if you have MyChart) OR . A paper copy in the mail If you have any lab test that is abnormal or we need to change your treatment, we will call you to review the results.   Testing/Procedures: None ordered.   Follow-Up: At [facility], you and your health needs are our priority.  As part of our continuing mission to provide you with exceptional heart care, we have created designated Provider Care Teams.  These Care Teams include your primary Cardiologist (physician) and Advanced Practice Providers (APPs -  Physician Assistants and Nurse Practitioners) who all work together to provide you with the care you need, when you need it.  We recommend signing up for the patient portal called "MyChart".  Sign up information is provided on this After Visit Summary.  MyChart is used to connect with patients for Virtual Visits (Telemedicine).  Patients are able to view lab/test results, encounter notes, upcoming appointments, etc.  Non-urgent messages can be sent to your provider as well.   To learn more about what you can do with MyChart, go to [email or web address].    Your next appointment:   1 month(s)  The format for your next appointment:   In Person  Provider:   You may see Yvonne Kendall, MD or one of the following Advanced Practice Providers on your designated Care Team:    Nicolasa Ducking, NP  Eula Listen, PA-C  Marisue Ivan, PA-C  Cadence Fransico Michael, [place]    Other Instructions   Call our office if you gain 2 pounds  overnight or 5 pounds in one week.   Heart Failure, Self Care Heart failure is a serious condition. This sheet explains things you need to do to take care of yourself at home. To help you stay as healthy as possible, you may be asked to change your diet, take certain medicines, and make other changes in your life. Your doctor may also give you more specific instructions. If you have problems or questions, call your doctor. What are the risks? Having heart failure makes it more likely for you to have some problems. These problems can get worse if you do not take good care of yourself. Problems may include:  Blood clotting problems. This may cause a stroke.  Damage to the kidneys, liver, or lungs.  Abnormal heart rhythms. Supplies needed:  Scale for weighing yourself.  Blood pressure monitor.  Notebook.  Medicines. How to care for yourself when you have heart failure Medicines Take over-the-counter and prescription medicines only as told by your doctor. Take your medicines every day.  Do not stop taking your medicine unless your doctor tells you to do so.  Do not skip any medicines.  Get your prescriptions refilled before you run out of medicine. This is important. Eating and drinking   Eat heart-healthy foods. Talk with a diet specialist (dietitian) to create an eating plan.  Choose foods that: ? Have no trans fat. ? Are low in saturated fat and cholesterol.  Choose healthy foods, such as: ? Fresh or frozen  fruits and vegetables. ? Fish. ? Low-fat (lean) meats. ? Legumes, such as beans, peas, and lentils. ? Fat-free or low-fat dairy products. ? Whole-grain foods. ? High-fiber foods.  Limit salt (sodium) if told by your doctor. Ask your diet specialist to tell you which seasonings are healthy for your heart.  Cook in healthy ways instead of frying. Healthy ways of cooking include roasting, grilling, broiling, baking, poaching, steaming, and stir-frying.  Limit how  much fluid you drink, if told by your doctor. Alcohol use  Do not drink alcohol if: ? Your doctor tells you not to drink. ? Your heart was damaged by alcohol, or you have very bad heart failure. ? You are pregnant, may be pregnant, or are planning to become pregnant.  If you drink alcohol: ? Limit how much you use to:  0-1 drink a day for women.  0-2 drinks a day for men. ? Be aware of how much alcohol is in your drink. In the [place] one drink equals one 12 oz bottle of beer (355 mL), one 5 oz glass of wine (148 mL), or one 1 oz glass of hard liquor (44 mL). Lifestyle   Do not use any products that contain nicotine or tobacco, such as cigarettes, e-cigarettes, and chewing tobacco. If you need help quitting, ask your doctor. ? Do not use nicotine gum or patches before talking to your doctor.  Do not use illegal drugs.  Lose weight if told by your doctor.  Do physical activity if told by your doctor. Talk to your doctor before you begin an exercise if: ? You are an older adult. ? You have very bad heart failure.  Learn to manage stress. If you need help, ask your doctor.  Get rehab (rehabilitation) to help you stay independent and to help with your quality of life.  Plan time to rest when you get tired. Check weight and blood pressure   Weigh yourself every day. This will help you to know if fluid is building up in your body. ? Weigh yourself every morning after you pee (urinate) and before you eat breakfast. ? Wear the same amount of clothing each time. ? Write down your daily weight. Give your record to your doctor.  Check and write down your blood pressure as told by your doctor.  Check your pulse as told by your doctor. Dealing with very hot and very cold weather  If it is very hot: ? Avoid activities that take a lot of energy. ? Use air conditioning or fans, or find a cooler place. ? Avoid caffeine and alcohol. ? Wear clothing that is loose-fitting, lightweight,  and light-colored.  If it is very cold: ? Avoid activities that take a lot of energy. ? Layer your clothes. ? Wear mittens or gloves, a hat, and a scarf when you go outside. ? Avoid alcohol. Follow these instructions at home:  Stay up to date with shots (vaccines). Get pneumococcal and flu (influenza) shots.  Keep all follow-up visits as told by your doctor. This is important. Contact a doctor if:  You gain weight quickly.  You have increasing shortness of breath.  You cannot do your normal activities.  You get tired easily.  You cough a lot.  You don't feel like eating or feel like you may vomit (nauseous).  You become puffy (swell) in your hands, feet, ankles, or belly (abdomen).  You cannot sleep well because it is hard to breathe.  You feel like your heart is beating  fast (palpitations).  You get dizzy when you stand up. Get help right away if:  You have trouble breathing.  You or someone else notices a change in your behavior, such as having trouble staying awake.  You have chest pain or discomfort.  You pass out (faint). These symptoms may be an emergency. Do not wait to see if the symptoms will go away. Get medical help right away. Call your local emergency services (911 in the [place] Do not drive yourself to the hospital. Summary  Heart failure is a serious condition. To care for yourself, you may have to change your diet, take medicines, and make other lifestyle changes.  Take your medicines every day. Do not stop taking them unless your doctor tells you to do so.  Eat heart-healthy foods, such as fresh or frozen fruits and vegetables, fish, lean meats, legumes, fat-free or low-fat dairy products, and whole-grain or high-fiber foods.  Ask your doctor if you can drink alcohol. You may have to stop alcohol use if you have very bad heart failure.  Contact your doctor if you gain weight quickly or feel that your heart is beating too fast. Get help right away  if you pass out, or have chest pain or trouble breathing. This information is not intended to replace advice given to you by your health care provider. Make sure you discuss any questions you have with your health care provider. Document Revised: 03/17/2019 Document Reviewed: 03/18/2019 Elsevier Patient Education  2020 [employer].   Coping with Quitting Smoking  Quitting smoking is a physical and mental challenge. You will face cravings, withdrawal symptoms, and temptation. Before quitting, work with your health care provider to make a plan that can help you cope. Preparation can help you quit and keep you from giving in. How can I cope with cravings? Cravings usually last for 5-10 minutes. If you get through it, the craving will pass. Consider taking the following actions to help you cope with cravings:  Keep your mouth busy: ? Chew sugar-free gum. ? Suck on hard candies or a straw. ? Brush your teeth.  Keep your hands and body busy: ? Immediately change to a different activity when you feel a craving. ? Squeeze or play with a ball. ? Do an activity or a hobby, like making bead jewelry, practicing needlepoint, or working with wood. ? Mix up your normal routine. ? Take a short exercise break. Go for a quick walk or run up and down stairs. ? Spend time in public places where smoking is not allowed.  Focus on doing something kind or helpful for someone else.  Call a friend or family member to talk during a craving.  Join a support group.  Call a quit line, such as 1-800-QUIT-NOW.  Talk with your health care provider about medicines that might help you cope with cravings and make quitting easier for you. How can I deal with withdrawal symptoms? Your body may experience negative effects as it tries to get used to not having nicotine in the system. These effects are called withdrawal symptoms. They may include:  Feeling hungrier than normal.  Trouble  concentrating.  Irritability.  Trouble sleeping.  Feeling depressed.  Restlessness and agitation.  Craving a cigarette. To manage withdrawal symptoms:  Avoid places, people, and activities that trigger your cravings.  Remember why you want to quit.  Get plenty of sleep.  Avoid coffee and other caffeinated drinks. These may worsen some of your symptoms. How can I handle  social situations? Social situations can be difficult when you are quitting smoking, especially in the first few weeks. To manage this, you can:  Avoid parties, bars, and other social situations where people might be smoking.  Avoid alcohol.  Leave right away if you have the urge to smoke.  Explain to your family and friends that you are quitting smoking. Ask for understanding and support.  Plan activities with friends or family where smoking is not an option. What are some ways I can cope with stress? Wanting to smoke may cause stress, and stress can make you want to smoke. Find ways to manage your stress. Relaxation techniques can help. For example:  Breathe slowly and deeply, in through your nose and out through your mouth.  Listen to soothing, relaxing music.  Talk with a family member or friend about your stress.  Light a candle.  Soak in a bath or take a shower.  Think about a peaceful place. What are some ways I can prevent weight gain? Be aware that many people gain weight after they quit smoking. However, not everyone does. To keep from gaining weight, have a plan in place before you quit and stick to the plan after you quit. Your plan should include:  Having healthy snacks. When you have a craving, it may help to: ? Eat plain popcorn, crunchy carrots, celery, or other cut vegetables. ? Chew sugar-free gum.  Changing how you eat: ? Eat small portion sizes at meals. ? Eat 4-6 small meals throughout the day instead of 1-2 large meals a day. ? Be mindful when you eat. Do not watch  television or do other things that might distract you as you eat.  Exercising regularly: ? Make time to exercise each day. If you do not have time for a long workout, do short bouts of exercise for 5-10 minutes several times a day. ? Do some form of strengthening exercise, like weight lifting, and some form of aerobic exercise, like running or swimming.  Drinking plenty of water or other low-calorie or no-calorie drinks. Drink 6-8 glasses of water daily, or as much as instructed by your health care provider. Summary  Quitting smoking is a physical and mental challenge. You will face cravings, withdrawal symptoms, and temptation to smoke again. Preparation can help you as you go through these challenges.  You can cope with cravings by keeping your mouth busy (such as by chewing gum), keeping your body and hands busy, and making calls to family, friends, or a helpline for people who want to quit smoking.  You can cope with withdrawal symptoms by avoiding places where people smoke, avoiding drinks with caffeine, and getting plenty of rest.  Ask your health care provider about the different ways to prevent weight gain, avoid stress, and handle social situations. This information is not intended to replace advice given to you by your health care provider. Make sure you discuss any questions you have with your health care provider. Document Revised: 11/15/2017 Document Reviewed: 11/30/2016 Elsevier Patient Education  2020 [employer].

## 2020-10-24 NOTE — Progress Notes (Signed)
Office Visit    Patient Name: Kurt Blankenship Date of Encounter: 10/24/2020  Primary Care Provider:  System, Provider Not In Primary Cardiologist:  Yvonne Kendall, MD Electrophysiologist:  None   Chief Complaint    Kurt Blankenship is a 64 y.o. male with a hx of chronic systolic heart failure (09/2020 EF 20-25%), HTN, COPD, tobacco use, chronic pain syndrome, palpitations/atrial tachycardia presents today for hospital follow up   Past Medical History    Past Medical History:  Diagnosis Date  . Alcohol use   . CHF (congestive heart failure) (HCC)   . COPD (chronic obstructive pulmonary disease) (HCC)   . Degenerative joint disease    a. S/p bilateral knee replacements.  . Hypertension   . Low back pain   . Tobacco abuse    Past Surgical History:  Procedure Laterality Date  . Bilateral knee replacements    . RIGHT/LEFT HEART CATH AND CORONARY ANGIOGRAPHY N/A 06/10/2020   Procedure: RIGHT/LEFT HEART CATH AND CORONARY ANGIOGRAPHY;  Surgeon: Yvonne Kendall, MD;  Location: [facility] INVASIVE CV LAB;  Service: Cardiovascular;  Laterality: N/A;    Allergies  No Known Allergies  History of Present Illness    Kurt Blankenship is a 64 y.o. male with a hx of chronic systolic heart failure (09/2020 EF 20-25%), HTN, COPD, tobacco use, chronic pain syndrome, palpitations/atrial tachycardia last seen while hospitalized.  Previous echo 05/2020 with EF 20-25%. R/LHC 06/10/20 with mild to moderate nonobstructive CAD (50% stenoses involving small D1 branch and mid RCA) deemed out of proportion to systolic heart failure, deemed NICM.  Previously admitted 10/05/20 for CHF and COPD exacerbation though left AMA 10/08/20. He was admitted readmitted 10/10/20 - 10/17/20 after presenting with acute hypoxic respiratory failure. Treated with IV steroids and IV Lasix. Echo 10/06/20 EF 20-25%. During hospitalization required one dose of Kayexelate for K 5.6.  He was discharged with hospice care. On discharge  recommended for Lasix 40mg  BID, Kdur BID, Coreg, Losartan. [facility] to [facility] deferred due to SBP <100.   Presents today for follow-up.  His discharge weight 168 pounds. Weight today 167 pounds.  He has been weighing at home and report his weight has been 167 pounds which is his baseline.  Reports no chest pain, pressure, tightness.  Endorses dyspnea on exertion is stable.  He is hypotensive in clinic though denies lightheadedness, dizziness, near-syncope, syncope.  He does endorse fatigue.  On review of his medication bottles he has been taking both lisinopril and losartan.  He reports pain in his bilateral thigh that is tender on palpation.  Encouraged to use heat pack and discuss with his pain management provider at appointment tomorrow.  EKGs/Labs/Other Studies Reviewed:   The following studies were reviewed today:   2D echo 06/08/2020: 1. Left ventricular ejection fraction, by estimation, is 20 to 25%. The  left ventricle has severely decreased function. The left ventricle  demonstrates global hypokinesis. Left ventricular diastolic function could  not be evaluated.   2. Right ventricular systolic function is normal. The right ventricular  size is mildly enlarged.   3. The mitral valve was not well visualized. No evidence of mitral valve  regurgitation.   4. The aortic valve was not well visualized. Aortic valve regurgitation  not well assessed.   5. The inferior vena cava is dilated in size with >50% respiratory  variability, suggesting right atrial pressure of 8 mmHg.  __________   [facility] 06/10/2020: Conclusions: 1. Mild to moderate, nonobstructive coronary artery disease with  up to 50% stenoses involving small D1 branch as well as mid RCA.  Systolic dysfunction is out of proportion to CAD, consistent with nonischemic cardiomyopathy. 2. Mildly elevated left and right heart filling pressures. 3. Low normal to mildly reduced Fick cardiac output.    Recommendations: 1. Optimize goal-directed medical therapy for management of nonischemic cardiomyopathy.  Smoking, tobacco, and drug cessation encouraged, as cardiomyopathy certainly could be related to substance abuse. 2. Medical therapy and risk factor modification to prevent progression of coronary artery disease. __________   2D echo 10/06/2020: 1. Left ventricular ejection fraction, by estimation, is 20 to 25%. The  left ventricle has severely decreased function. The left ventricle has no  regional wall motion abnormalities. The left ventricular internal cavity  size was moderately dilated. Left  ventricular diastolic parameters were normal.   2. Right ventricular systolic function is moderately reduced. The right  ventricular size is normal. Tricuspid regurgitation signal is inadequate  for assessing PA pressure.   3. Mild mitral valve regurgitation.   4. The inferior vena cava is dilated in size with >50% respiratory  variability, suggesting right atrial pressure of 8 mmHg.  EKG:  EKG is  ordered today.  The ekg ordered today demonstrates NSR 72 bpm with no acute ST/ T wave changes  Recent Labs: 10/11/2020: ALT 62; Hemoglobin 15.1; Magnesium 2.0; Platelets 210; TSH 0.484 10/16/2020: B Natriuretic Peptide 923.4 10/17/2020: BUN 16; Creatinine, Ser 0.91; Potassium 3.1; Sodium 133  Recent Lipid Panel    Component Value Date/Time   CHOL 123 10/06/2020 0409   TRIG 46 10/06/2020 0409   HDL 41 10/06/2020 0409   CHOLHDL 3.0 10/06/2020 0409   VLDL 9 10/06/2020 0409   LDLCALC 73 10/06/2020 0409    Home Medications   No outpatient medications have been marked as taking for the 10/24/20 encounter (Appointment) with Alver Sorrow, NP.     Review of Systems  All other systems reviewed and are otherwise negative except as noted above.  Physical Exam    VS:  There were no vitals taken for this visit. , BMI There is no height or weight on file to calculate BMI.  Wt Readings  from Last 3 Encounters:  10/17/20 168 lb 8 oz (76.4 kg)  10/08/20 187 lb (84.8 kg)  06/09/20 170 lb 10.2 oz (77.4 kg)    GEN: Well nourished, well developed, in no acute distress. HEENT: normal. Neck: Supple, no JVD, carotid bruits, or masses. Cardiac: RRR, no murmurs, rubs, or gallops. No clubbing, cyanosis, edema.  Radials/DP/PT 2+ and equal bilaterally.  Respiratory:  Respirations regular and unlabored, clear to auscultation bilaterally. GI: Soft, nontender, nondistended. MS: No deformity or atrophy. Skin: Warm and dry, no rash. Neuro:  Strength and sensation are intact. Psych: Normal affect.  Assessment & Plan    1. HFrEF -euvolemic and well compensated on exam.  Reports home weight is stable 167 pounds.  Endorses eating low-salt diet, his cousin is assisting him with eating a heart healthy.  GDMT includes Coreg 6.25 mg twice daily, losartan 25 mg daily, furosemide 40 mg twice daily, potassium 20 mEq twice daily.  Of note, he has been taking both lisinopril and losartan and we have discontinued lisinopril.  I will defer refill of medications until BMP today returns.  2. COPD with ongoing tobacco use -anticipate this is also contributory to his dyspnea on exertion.  Smoking cessation encouraged.  3. HTN / Hypotension - Hypotension in the hospital limited further escalation of heart failure therapies.  He is hypotensive in clinic today though without lightheadedness, dizziness, near-syncope, syncope.  He does endorse fatigue.  He has been taking both lisinopril and losartan since hospital discharge -we have disposed of his lisinopril for him.  Anticipate this is why his blood pressure was 84/64 in clinic. Encouraged him to check BP at home, he reports home BP is "good" but unable to provide specific readings.   4. Hyperkalemia -required Kayexalate while hospitalized and discharged on K-Dur due to diuresis.  BMP today.  5. Tobacco and alcohol use -complete cessation of alcohol and tobacco  encouraged.  Recommend utilization of 1 800 quit now.  Provided handout of methods to cope with quitting smoking.  6. Palpitations / Atrial tachycardia -denies recurrent palpitations.  Continue Coreg 6.25 mg twice daily.  Disposition: Follow up in 1 month(s) with Dr. Okey Dupre or APP   Signed, Alver Sorrow, NP 10/24/2020, 7:50 AM [facility]

## 2020-10-25 ENCOUNTER — Ambulatory Visit: Payer: Medicare HMO | Admitting: Family

## 2020-10-25 ENCOUNTER — Other Ambulatory Visit: Payer: Self-pay | Admitting: Family

## 2020-10-25 LAB — BASIC METABOLIC PANEL
BUN/Creatinine Ratio: 15 (ref 10–24)
BUN: 10 mg/dL (ref 8–27)
CO2: 31 mmol/L — ABNORMAL HIGH (ref 20–29)
Calcium: 9.7 mg/dL (ref 8.6–10.2)
Chloride: 93 mmol/L — ABNORMAL LOW (ref 96–106)
Creatinine, Ser: 0.68 mg/dL — ABNORMAL LOW (ref 0.76–1.27)
GFR calc Af Amer: 117 mL/min/{1.73_m2} (ref >59)
GFR calc non Af Amer: 102 mL/min/{1.73_m2} (ref >59)
Glucose: 97 mg/dL (ref 65–99)
Potassium: 4.7 mmol/L (ref 3.5–5.2)
Sodium: 135 mmol/L (ref 134–144)

## 2020-10-25 MED ORDER — POTASSIUM CHLORIDE CRYS ER 20 MEQ PO TBCR: 20.0000 meq | EXTENDED_RELEASE_TABLET | Freq: Two times a day (BID) | ORAL | 5 refills | Status: AC

## 2020-10-25 MED ORDER — ATORVASTATIN CALCIUM 20 MG PO TABS: 20.0000 mg | ORAL_TABLET | Freq: Every day | ORAL | 5 refills | Status: AC

## 2020-10-25 MED ORDER — LOSARTAN POTASSIUM 25 MG PO TABS: 25.0000 mg | ORAL_TABLET | Freq: Every day | ORAL | 5 refills | Status: AC

## 2020-10-25 MED ORDER — CARVEDILOL 6.25 MG PO TABS: 6.2500 mg | ORAL_TABLET | Freq: Two times a day (BID) | ORAL | 5 refills | Status: AC

## 2020-10-25 MED ORDER — FUROSEMIDE 40 MG PO TABS: 40.0000 mg | ORAL_TABLET | Freq: Two times a day (BID) | ORAL | 5 refills | Status: AC

## 2020-11-23 ENCOUNTER — Ambulatory Visit: Payer: Medicare HMO | Admitting: Family

## 2020-11-23 NOTE — Progress Notes (Deleted)
Office Visit    Patient Name: [name] Date of Encounter: 11/23/2020  Primary Care Provider:  System, Provider Not In Primary Cardiologist:  Yvonne Kendall, MD Electrophysiologist:  None   Chief Complaint    Kurt Blankenship is a 64 y.o. male with a hx of chronic systolic heart failure (09/2020 EF 20-25%), HTN, COPD, tobacco use, chronic pain syndrome, palpitations/atrial tachycardia presents today for follow up of heart failure  Past Medical History    Past Medical History:  Diagnosis Date  . Alcohol use   . CHF (congestive heart failure) (HCC)   . COPD (chronic obstructive pulmonary disease) (HCC)   . Degenerative joint disease    a. S/p bilateral knee replacements.  . Hypertension   . Low back pain   . Tobacco abuse    Past Surgical History:  Procedure Laterality Date  . Bilateral knee replacements    . RIGHT/LEFT HEART CATH AND CORONARY ANGIOGRAPHY N/A 06/10/2020   Procedure: RIGHT/LEFT HEART CATH AND CORONARY ANGIOGRAPHY;  Surgeon: Yvonne Kendall, MD;  Location: [facility] INVASIVE CV LAB;  Service: Cardiovascular;  Laterality: N/A;    Allergies  No Known Allergies  History of Present Illness    Kurt Blankenship is a 64 y.o. male with a hx of chronic systolic heart failure (09/2020 EF 20-25%), HTN, COPD, tobacco use, chronic pain syndrome, palpitations/atrial tachycardia last seen while hospitalized.***  Previous echo 05/2020 with EF 20-25%. R/LHC 06/10/20 with mild to moderate nonobstructive CAD (50% stenoses involving small D1 branch and mid RCA) deemed out of proportion to systolic heart failure, deemed NICM.  Previously admitted 10/05/20 for CHF and COPD exacerbation though left AMA 10/08/20. He was admitted readmitted 10/10/20 - 10/17/20 after presenting with acute hypoxic respiratory failure. Treated with IV steroids and IV Lasix. Echo 10/06/20 EF 20-25%. During hospitalization required one dose of Kayexelate for K 5.6.  He was discharged with hospice care. On  discharge recommended for Lasix 40mg  BID, Kdur BID, Coreg, Losartan. Transition to [facility] deferred due to SBP <100.   Presents today for follow-up.  His discharge weight 168 pounds. Weight today 167 pounds.  He has been weighing at home and report his weight has been 167 pounds which is his baseline.  Reports no chest pain, pressure, tightness.  Endorses dyspnea on exertion is stable.  He is hypotensive in clinic though denies lightheadedness, dizziness, near-syncope, syncope.  He does endorse fatigue.  On review of his medication bottles he has been taking both lisinopril and losartan.  He reports pain in his bilateral thigh that is tender on palpation.  Encouraged to use heat pack and discuss with his pain management provider at appointment tomorrow.  ***  EKGs/Labs/Other Studies Reviewed:   The following studies were reviewed today:   2D echo 06/08/2020: 1. Left ventricular ejection fraction, by estimation, is 20 to 25%. The  left ventricle has severely decreased function. The left ventricle  demonstrates global hypokinesis. Left ventricular diastolic function could  not be evaluated.   2. Right ventricular systolic function is normal. The right ventricular  size is mildly enlarged.   3. The mitral valve was not well visualized. No evidence of mitral valve  regurgitation.   4. The aortic valve was not well visualized. Aortic valve regurgitation  not well assessed.   5. The inferior vena cava is dilated in size with >50% respiratory  variability, suggesting right atrial pressure of 8 mmHg.  __________   [facility] 06/10/2020: Conclusions: 1. Mild to moderate, nonobstructive coronary  artery disease with up to 50% stenoses involving small D1 branch as well as mid RCA.  Systolic dysfunction is out of proportion to CAD, consistent with nonischemic cardiomyopathy. 2. Mildly elevated left and right heart filling pressures. 3. Low normal to mildly reduced Fick cardiac output.    Recommendations: 1. Optimize goal-directed medical therapy for management of nonischemic cardiomyopathy.  Smoking, tobacco, and drug cessation encouraged, as cardiomyopathy certainly could be related to substance abuse. 2. Medical therapy and risk factor modification to prevent progression of coronary artery disease. __________   2D echo 10/06/2020: 1. Left ventricular ejection fraction, by estimation, is 20 to 25%. The  left ventricle has severely decreased function. The left ventricle has no  regional wall motion abnormalities. The left ventricular internal cavity  size was moderately dilated. Left  ventricular diastolic parameters were normal.   2. Right ventricular systolic function is moderately reduced. The right  ventricular size is normal. Tricuspid regurgitation signal is inadequate  for assessing PA pressure.   3. Mild mitral valve regurgitation.   4. The inferior vena cava is dilated in size with >50% respiratory  variability, suggesting right atrial pressure of 8 mmHg.  EKG:  EKG is  ordered today.  The ekg ordered today demonstrates NSR 72 bpm with no acute ST/ T wave changes***  Recent Labs: 10/11/2020: ALT 62; Hemoglobin 15.1; Magnesium 2.0; Platelets 210; TSH 0.484 10/16/2020: B Natriuretic Peptide 923.4 10/24/2020: BUN 10; Creatinine, Ser 0.68; Potassium 4.7; Sodium 135  Recent Lipid Panel    Component Value Date/Time   CHOL 123 10/06/2020 0409   TRIG 46 10/06/2020 0409   HDL 41 10/06/2020 0409   CHOLHDL 3.0 10/06/2020 0409   VLDL 9 10/06/2020 0409   LDLCALC 73 10/06/2020 0409    Home Medications   No outpatient medications have been marked as taking for the 11/23/20 encounter (Appointment) with Alver Sorrow, NP.     Review of Systems  All other systems reviewed and are otherwise negative except as noted above.  Physical Exam    VS:  There were no vitals taken for this visit. , BMI There is no height or weight on file to calculate BMI.  Wt Readings  from Last 3 Encounters:  10/24/20 167 lb (75.8 kg)  10/17/20 168 lb 8 oz (76.4 kg)  10/08/20 187 lb (84.8 kg)   *** GEN: Well nourished, well developed, in no acute distress. HEENT: normal. Neck: Supple, no JVD, carotid bruits, or masses. Cardiac: RRR, no murmurs, rubs, or gallops. No clubbing, cyanosis, edema.  Radials/DP/PT 2+ and equal bilaterally.  Respiratory:  Respirations regular and unlabored, clear to auscultation bilaterally. GI: Soft, nontender, nondistended. MS: No deformity or atrophy. Skin: Warm and dry, no rash. Neuro:  Strength and sensation are intact. Psych: Normal affect.  Assessment & Plan    1. HFrEF -***euvolemic and well compensated on exam.  Reports home weight is stable 167 pounds.  Endorses eating low-salt diet, his cousin is assisting him with eating a heart healthy.  GDMT includes Coreg 6.25 mg twice daily, losartan 25 mg daily, furosemide 40 mg twice daily, potassium 20 mEq twice daily.  Of note, he has been taking both lisinopril and losartan and we have discontinued lisinopril.  I will defer refill of medications until BMP today returns.  2. COPD with ongoing tobacco use -***anticipate this is also contributory to his dyspnea on exertion.  Smoking cessation encouraged.  3. HTN / Hypotension - ***Hypotension in the hospital limited further escalation of heart failure  therapies.  He is hypotensive in clinic today though without lightheadedness, dizziness, near-syncope, syncope.  He does endorse fatigue.  He has been taking both lisinopril and losartan since hospital discharge -we have disposed of his lisinopril for him.  Anticipate this is why his blood pressure was 84/64 in clinic. Encouraged him to check BP at home, he reports home BP is "good" but unable to provide specific readings.   4. Hyperkalemia -***required Kayexalate while hospitalized and discharged on K-Dur due to diuresis.  BMP today.  5. Tobacco and alcohol use -***complete cessation of alcohol  and tobacco encouraged.  Recommend utilization of 1 800 quit now.  Provided handout of methods to cope with quitting smoking.  6. Palpitations / Atrial tachycardia ***-denies recurrent palpitations.  Continue Coreg 6.25 mg twice daily.  Disposition: Follow up in 1 month(s) with Dr. Okey Dupre or APP   Signed, Alver Sorrow, NP 11/23/2020, 10:59 AM [facility]

## 2020-11-24 ENCOUNTER — Encounter: Payer: Self-pay | Admitting: Family

## 2020-12-19 NOTE — Progress Notes (Deleted)
Follow-up Outpatient Visit Date: 12/21/2020  Primary Care Provider: System, Provider Not In No address on file  Chief Complaint: ***  HPI:  Kurt Blankenship is a 65 y.o. male with history of chronic HFrEF, hypertension, COPD, tobacco use, and chronic pain syndrome, who presents for follow-up of heart failure. He was last seen in our office in early November by Kurt Shields, NP, at which time he reported stable exertional dyspnea and no chest discomfort. He reported taking both lisinopril and losartan at that time and was found to be hypotensive with a blood pressure of 84/64 in the clinic (he was asymptomatic). He was advised to stop lisinopril. He did not present for his follow-up appointment with Kurt Blankenship on 11/23/2020.  --------------------------------------------------------------------------------------------------  Past Medical History:  Diagnosis Date  . Alcohol use   . CHF (congestive heart failure) (HCC)   . COPD (chronic obstructive pulmonary disease) (HCC)   . Degenerative joint disease    a. S/p bilateral knee replacements.  . Hypertension   . Low back pain   . Tobacco abuse    Past Surgical History:  Procedure Laterality Date  . Bilateral knee replacements    . RIGHT/LEFT HEART CATH AND CORONARY ANGIOGRAPHY N/A 06/10/2020   Procedure: RIGHT/LEFT HEART CATH AND CORONARY ANGIOGRAPHY;  Surgeon: Yvonne Kendall, MD;  Location: [facility] INVASIVE CV LAB;  Service: Cardiovascular;  Laterality: N/A;    No outpatient medications have been marked as taking for the 12/21/20 encounter (Appointment) with Kurt Blankenship, Kurt Deer, MD.    Allergies: Patient has no known allergies.  Social History   Tobacco Use  . Smoking status: Current Every Day Smoker    Packs/day: 1.50    Types: Cigarettes  . Smokeless tobacco: Never Used  Vaping Use  . Vaping Use: Never used  Substance Use Topics  . Alcohol use: Yes    Alcohol/week: 1.0 - 2.0 standard drink    Types: 1 - 2 Cans of beer per week     Comment: 1-2 beer daily  . Drug use: Yes    Types: Cocaine    Comment: pt denies    Family History  Problem Relation Age of Onset  . Heart failure Mother        died w/ "enlarged heart" in late 74's  . Cancer Father   . COPD Sister   . Heart attack Brother        died in his late 52's  . COPD Sister   . COPD Sister     Review of Systems: A 12-system review of systems was performed and was negative except as noted in the HPI.  --------------------------------------------------------------------------------------------------  Physical Exam: There were no vitals taken for this visit.  General:  NAD. Neck: No JVD or HJR. Lungs: Clear to auscultation bilaterally without wheezes or crackles. Heart: Regular rate and rhythm without murmurs, rubs, or gallops. Abdomen: Soft, nontender, nondistended. Extremities: No lower extremity edema.  EKG:  ***  Lab Results  Component Value Date   WBC 8.3 10/11/2020   HGB 15.1 10/11/2020   HCT 45.2 10/11/2020   MCV 103.2 (H) 10/11/2020   PLT 210 10/11/2020    Lab Results  Component Value Date   NA 135 10/24/2020   K 4.7 10/24/2020   CL 93 (L) 10/24/2020   CO2 31 (H) 10/24/2020   BUN 10 10/24/2020   CREATININE 0.68 (L) 10/24/2020   GLUCOSE 97 10/24/2020   ALT 62 (H) 10/11/2020    Lab Results  Component Value Date   CHOL  123 10/06/2020   HDL 41 10/06/2020   LDLCALC 73 10/06/2020   TRIG 46 10/06/2020   CHOLHDL 3.0 10/06/2020    --------------------------------------------------------------------------------------------------  ASSESSMENT AND PLAN: Kurt Blankenship Kurt Reinwald, MD 12/19/2020 2:00 PM

## 2020-12-21 ENCOUNTER — Ambulatory Visit: Payer: Medicare HMO | Admitting: Internal Medicine

## 2020-12-28 ENCOUNTER — Ambulatory Visit: Payer: Medicare HMO | Admitting: Physician Assistant

## 2021-01-06 ENCOUNTER — Telehealth: Payer: Self-pay | Admitting: Internal Medicine

## 2021-01-06 ENCOUNTER — Ambulatory Visit: Admitting: Physician Assistant

## 2021-01-06 NOTE — Telephone Encounter (Signed)
  Patient Consent for Virtual Visit         Kurt Blankenship has provided verbal consent on 01/06/2021 for a virtual visit (video or telephone).   CONSENT FOR VIRTUAL VISIT FOR:  Kurt Blankenship  By participating in this virtual visit I agree to the following:  I hereby voluntarily request, consent and authorize [facility] and its employed or contracted physicians, physician assistants, nurse practitioners or other licensed health care professionals (the Practitioner), to provide me with telemedicine health care services (the "Services") as deemed necessary by the treating Practitioner. I acknowledge and consent to receive the Services by the Practitioner via telemedicine. I understand that the telemedicine visit will involve communicating with the Practitioner through live audiovisual communication technology and the disclosure of certain medical information by electronic transmission. I acknowledge that I have been given the opportunity to request an in-person assessment or other available alternative prior to the telemedicine visit and am voluntarily participating in the telemedicine visit.  I understand that I have the right to withhold or withdraw my consent to the use of telemedicine in the course of my care at any time, without affecting my right to future care or treatment, and that the Practitioner or I may terminate the telemedicine visit at any time. I understand that I have the right to inspect all information obtained and/or recorded in the course of the telemedicine visit and may receive copies of available information for a reasonable fee.  I understand that some of the potential risks of receiving the Services via telemedicine include:  Marland Kitchen Delay or interruption in medical evaluation due to technological equipment failure or disruption; . Information transmitted may not be sufficient (e.g. poor resolution of images) to allow for appropriate medical decision making by the Practitioner;  and/or  . In rare instances, security protocols could fail, causing a breach of personal health information.  Furthermore, I acknowledge that it is my responsibility to provide information about my medical history, conditions and care that is complete and accurate to the best of my ability. I acknowledge that Practitioner's advice, recommendations, and/or decision may be based on factors not within their control, such as incomplete or inaccurate data provided by me or distortions of diagnostic images or specimens that may result from electronic transmissions. I understand that the practice of medicine is not an exact science and that Practitioner makes no warranties or guarantees regarding treatment outcomes. I acknowledge that a copy of this consent can be made available to me via my patient portal [facility] MyChart), or I can request a printed copy by calling the office of [facility].    I understand that my insurance will be billed for this visit.   I have read or had this consent read to me. . I understand the contents of this consent, which adequately explains the benefits and risks of the Services being provided via telemedicine.  . I have been provided ample opportunity to ask questions regarding this consent and the Services and have had my questions answered to my satisfaction. . I give my informed consent for the services to be provided through the use of telemedicine in my medical care

## 2021-01-10 ENCOUNTER — Telehealth: Payer: Self-pay | Admitting: Family

## 2021-01-10 ENCOUNTER — Telehealth (INDEPENDENT_AMBULATORY_CARE_PROVIDER_SITE_OTHER): Admitting: Family

## 2021-01-10 DIAGNOSIS — I5042 Chronic combined systolic (congestive) and diastolic (congestive) heart failure: Secondary | ICD-10-CM

## 2021-01-10 NOTE — Telephone Encounter (Signed)
Mr. Morones was scheduled for virtual visit 01/10/21. After calling 3 times and leaving VM he was marked as 'no show'. He returned call and stated he did not feel well enough for a visit at this time. His visit was rescheduled for 01/16/21.  Previously admitted 10/05/20 for CHF and COPD exacerbation though left AMA 10/08/20. He was admitted readmitted 10/10/20 - 10/17/20 after presenting with acute hypoxic respiratory failure. Treated with IV steroids and Lasix. Echo 10/06/20 EF 20-25%.  He was discharged after medical optimization with hospice care. On discharge recommended for Lasix, Kdur, Coreg, Losartan. Transition to [facility] deferred due to SBP hypotension.    Seen in follow up 10/24/20. He was hypotensive though asymptomatic and noted to be taking both Lisinopril and Losartan.  He was recommended to stop lisinopril and continue losartan 25 mg daily.   I called [facility] today to request an update and have left a VM requesting call back.   Alver Sorrow, NP

## 2021-01-10 NOTE — Progress Notes (Signed)
Patient cancelled and rescheduled.  Alver Sorrow, NP

## 2021-01-12 NOTE — Telephone Encounter (Signed)
Appropriately rescheduled for 01/16/21. No intervention needed before that time. At visit Monday, consider reduced dose of Lasix due to weight loss and reduced appetite.   Alver Sorrow, NP

## 2021-01-12 NOTE — Telephone Encounter (Signed)
Called [facility] again this morning to follow up on pt. Left message with triage nurse to have pt's case manager to return call regarding update requested.

## 2021-01-12 NOTE — Telephone Encounter (Signed)
Hospice nurse returned call. States pt did have Covid s/s approx 2 weeks ago. He was not tested, but also had his booster shot while s/s present.  He has currently been staying with his brother-in-law and when she saw pt last week he was up ambulating without difficulty.  Monday this week, 1/24, he did report shortness of breath. Sats 95-96% on assessment. Oxygen was present but pt was not wearing at the time.  Nurse also reports that pt has had weight decrease of 15 lbs since November 2021 and decreased appetite.  Otherwise, nurse states that pt has not had significant weakness on her assessments as he reported to our office on 01/10/21.  Notified nurse that we have rescheduled pt for virtual visit 01/16/21.

## 2021-01-16 ENCOUNTER — Telehealth: Payer: Self-pay | Admitting: Internal Medicine

## 2021-01-16 ENCOUNTER — Ambulatory Visit: Payer: Medicare HMO | Admitting: Family

## 2021-01-16 NOTE — Telephone Encounter (Signed)
[place] from [employer] calling in requesting Gillian Shields to sign patients death certificate. Patient expired on 01/13/20 at his home of natural causes. According to Asencion Partridge, the medical examiner, Gillian Shields is to sign the death certificate. The phone number to contact Mellody Dance is [phone] if there is question on signing, otherwise contact the funeral home.  Please advise

## 2021-01-16 NOTE — Telephone Encounter (Signed)
Thank you for making me aware.   Per our group's policy death certificates are to be signed by the primary cardiologist. I have CC'd Dr. Okey Dupre.  Dr. Okey Dupre will not be back in the office until tomorrow. If the funeral home is needing it signed today, they could potentially reach out to [facility] as that was the hospice agency who was following [name].   I have listed a brief history on the patient below for Dr. Okey Dupre:  Kurt Blankenship has a hx of chronic biventricular heart failure due to NICM, HTN, polysubstance abuse, chronic back pain. Admitted 05/2020 due to acute heart failure. LHC with non-obstructive CAD. He did not follow up after discharge and was hospitalized again 10/05/2020. Echo while admitted with LVEF 20-25%. He left AMA though returned 10/10/20 with acute hypoxic respiratory failure in setting of chronic biventricular heart failure and COPD exacerbation. He was discharged with hospice care as he preferred to medical optimization and then to not return to hospital. He was seen in follow up 10/24/20 in clinic by myself. He was notably hypotensive and brought both Lisinopril and Losartan, educated to stop Lisinopril. He missed a virtual appointment with our office last week and when hospice was contact they said 2 weeks prior he had COVID-symptoms but was never tested.   Alver Sorrow, NP

## 2021-01-17 NOTE — Telephone Encounter (Signed)
I do not. I am happy to call to find out if needed. It was Physicist, medical at [employer] location [phone]).

## 2021-01-17 NOTE — Telephone Encounter (Signed)
Thank you!!  Fatima Fedie S Adisson Deak, NP  

## 2021-01-17 NOTE — Telephone Encounter (Signed)
Spoke with Medical Records at [facility] She was surprised we had received the death certificate. They have Dr Pennie Banter with [facility] listed as the attending physician.  Dr Allena Katz accepted patient as attending in October 2021. She said their doctor is willing to sign the death certificate but prefers to have the community doctor if able.  Called to speak with Mellody Dance to update him. He is the medical examiner.  He advised me to call the funeral home to give them this information as he was not the one who needed the information.  Called [employer] and spoke with [place]. I gave her Dr Eliane Decree number and [facility]'s number for her to call about the death certificate instead of our office. She was appreciative and will reach back out to [place] if needed.

## 2021-01-17 NOTE — Telephone Encounter (Signed)
Do you know which physician was managing him at hospice?

## 2021-01-17 DEATH — deceased
